# Patient Record
Sex: Male | Born: 1975 | Race: White | Hispanic: No | Marital: Married | Smoking: Never smoker
Health system: Southern US, Community
[De-identification: ages and names within clinical notes are randomized; demographics above are authoritative.]

## PROBLEM LIST (undated history)

## (undated) DIAGNOSIS — E785 Hyperlipidemia, unspecified: Principal | ICD-10-CM

## (undated) DIAGNOSIS — F329 Major depressive disorder, single episode, unspecified: Secondary | ICD-10-CM

## (undated) DIAGNOSIS — R945 Abnormal results of liver function studies: Secondary | ICD-10-CM

## (undated) DIAGNOSIS — F32A Depression, unspecified: Secondary | ICD-10-CM

## (undated) HISTORY — DX: Depression, unspecified: F32.A

## (undated) HISTORY — DX: Abnormal results of liver function studies: R94.5

## (undated) HISTORY — DX: Major depressive disorder, single episode, unspecified: F32.9

## (undated) HISTORY — DX: Hyperlipidemia, unspecified: E78.5

## (undated) HISTORY — PX: WISDOM TOOTH EXTRACTION: SHX21

---

## 1999-08-19 ENCOUNTER — Emergency Department (HOSPITAL_COMMUNITY): Admission: EM | Admit: 1999-08-19 | Discharge: 1999-08-19 | Payer: Self-pay | Admitting: Emergency Medicine

## 1999-08-19 ENCOUNTER — Encounter: Payer: Self-pay | Admitting: Emergency Medicine

## 2005-05-24 ENCOUNTER — Emergency Department (HOSPITAL_COMMUNITY): Admission: EM | Admit: 2005-05-24 | Discharge: 2005-05-25 | Payer: Self-pay | Admitting: Emergency Medicine

## 2011-09-29 ENCOUNTER — Other Ambulatory Visit: Payer: Self-pay | Admitting: Family Medicine

## 2011-09-29 DIAGNOSIS — R7401 Elevation of levels of liver transaminase levels: Secondary | ICD-10-CM

## 2011-10-17 ENCOUNTER — Ambulatory Visit
Admission: RE | Admit: 2011-10-17 | Discharge: 2011-10-17 | Disposition: A | Discharge status: Home / Self Care | Payer: 59 | Source: Ambulatory Visit | Attending: Family Medicine | Admitting: Family Medicine

## 2011-10-17 DIAGNOSIS — R7401 Elevation of levels of liver transaminase levels: Secondary | ICD-10-CM

## 2012-05-09 LAB — MUMPS ANTIBODY, IGG: Rubella Antibodies, IGG: 39

## 2013-01-21 LAB — BASIC METABOLIC PANEL
BUN: 16 mg/dL (ref 4–21)
Creatinine: 1.1 mg/dL (ref 0.6–1.3)
GLUCOSE: 63 mg/dL
POTASSIUM: 4.7 mmol/L (ref 3.4–5.3)
Sodium: 139 mmol/L (ref 137–147)

## 2013-01-21 LAB — HEPATIC FUNCTION PANEL
ALT: 100 U/L — AB (ref 10–40)
AST: 45 U/L — AB (ref 14–40)

## 2013-01-21 LAB — LIPID PANEL
CHOLESTEROL: 185 mg/dL (ref 0–200)
HDL: 39 mg/dL (ref 35–70)
LDL CALC: 127 mg/dL
Triglycerides: 132 mg/dL (ref 40–160)

## 2013-05-27 LAB — LIPID PANEL: Cholesterol: 240 mg/dL — AB (ref 0–200)

## 2013-08-20 ENCOUNTER — Ambulatory Visit (INDEPENDENT_AMBULATORY_CARE_PROVIDER_SITE_OTHER): Payer: 59 | Admitting: Family Medicine

## 2013-08-20 ENCOUNTER — Encounter: Payer: Self-pay | Admitting: Family Medicine

## 2013-08-20 VITALS — BP 118/77 | HR 86 | Ht 71.0 in | Wt 198.0 lb

## 2013-08-20 DIAGNOSIS — R079 Chest pain, unspecified: Secondary | ICD-10-CM

## 2013-08-20 DIAGNOSIS — E785 Hyperlipidemia, unspecified: Secondary | ICD-10-CM

## 2013-08-20 DIAGNOSIS — R945 Abnormal results of liver function studies: Secondary | ICD-10-CM

## 2013-08-20 DIAGNOSIS — R7989 Other specified abnormal findings of blood chemistry: Secondary | ICD-10-CM | POA: Insufficient documentation

## 2013-08-20 HISTORY — DX: Hyperlipidemia, unspecified: E78.5

## 2013-08-20 HISTORY — DX: Other specified abnormal findings of blood chemistry: R79.89

## 2013-08-20 LAB — CBC
HCT: 46.5 % (ref 39.0–52.0)
Hemoglobin: 16.4 g/dL (ref 13.0–17.0)
MCH: 30.1 pg (ref 26.0–34.0)
MCHC: 35.3 g/dL (ref 30.0–36.0)
MCV: 85.5 fL (ref 78.0–100.0)
Platelets: 338 10*3/uL (ref 150–400)
RBC: 5.44 MIL/uL (ref 4.22–5.81)
RDW: 13.6 % (ref 11.5–15.5)
WBC: 5.3 10*3/uL (ref 4.0–10.5)

## 2013-08-20 LAB — COMPLETE METABOLIC PANEL WITH GFR
ALT: 51 U/L (ref 0–53)
AST: 25 U/L (ref 0–37)
Albumin: 4.9 g/dL (ref 3.5–5.2)
Alkaline Phosphatase: 60 U/L (ref 39–117)
BUN: 16 mg/dL (ref 6–23)
CO2: 28 mEq/L (ref 19–32)
Calcium: 10.2 mg/dL (ref 8.4–10.5)
Chloride: 102 mEq/L (ref 96–112)
Creat: 1.05 mg/dL (ref 0.50–1.35)
GFR, Est African American: >89 mL/min
GFR, Est Non African American: >89 mL/min
Glucose, Bld: 85 mg/dL (ref 70–99)
Potassium: 4.4 mEq/L (ref 3.5–5.3)
Sodium: 138 mEq/L (ref 135–145)
Total Bilirubin: 0.4 mg/dL (ref 0.2–1.2)
Total Protein: 7.3 g/dL (ref 6.0–8.3)

## 2013-08-20 LAB — LIPID PANEL
Cholesterol: 244 mg/dL — ABNORMAL HIGH (ref 0–200)
HDL: 42 mg/dL (ref >39)
LDL Cholesterol: 174 mg/dL — ABNORMAL HIGH (ref 0–99)
Total CHOL/HDL Ratio: 5.8 Ratio
Triglycerides: 140 mg/dL (ref <150)
VLDL: 28 mg/dL (ref 0–40)

## 2013-08-20 NOTE — Progress Notes (Signed)
CC: Brandon Thomas is a 38 y.o. male is here for Establish Care and check cholesterol   Subjective: HPI:  Pleasant 38 year old here to establish care  Patient reports a history of elevated LFTs found one a half years ago in the setting of taking Crestor for hyperlipidemia. Workup included hepatitis panel per his report and a liver ultrasound which I have reviewed which which he reports results were unremarkable.  He has not had LFTs checked since stopping Crestor. During the time of taking Crestor he believes he did have waxing and waning right upper quadrant pain. Currently denies any right upper quadrant pain. Denies any recent or remote icterus nor jaundice. He rarely drinks alcohol and rarely uses acetaminophen.  He reports a history of hyperlipidemia most recently checked by his work in January he is uncertain of the specifics of his lipid breakdown. He denies any epigastric pain recently remotely. Denies limb claudication but he does casually mention at the end of the visit that he occasionally gets exertional chest pain.  He describes as exertional chest pain is mild, only has pain, localized in the anterior chest and nonradiating. Denies episodes at rest, the example he gives use upper extremities for tasks such as yard work. He has no known cardiac history. He has requested stress test with his prior providers however they have declined. He reports he has had an EKG in the past for this concern and it has been unremarkable.  Review of Systems - General ROS: negative for - chills, fever, night sweats, weight gain or weight loss Ophthalmic ROS: negative for - decreased vision Psychological ROS: negative for - anxiety or depression ENT ROS: negative for - hearing change, nasal congestion, tinnitus or allergies Hematological and Lymphatic ROS: negative for - bleeding problems, bruising or swollen lymph nodes Breast ROS: negative Respiratory ROS: no cough, shortness of breath, or  wheezing Cardiovascular ROS: no dyspnea on exertion Gastrointestinal ROS: no abdominal pain, change in bowel habits, or black or bloody stools Genito-Urinary ROS: negative for - genital discharge, genital ulcers, incontinence or abnormal bleeding from genitals Musculoskeletal ROS: negative for - joint pain or muscle pain Neurological ROS: negative for - headaches or memory loss Dermatological ROS: negative for lumps, mole changes, rash and skin lesion changes  Past Medical History  Diagnosis Date  . Elevated LFTs 08/20/2013  . Hyperlipidemia 08/20/2013    No past surgical history on file. No family history on file.  History   Social History  . Marital Status: Single    Spouse Name: N/A    Number of Children: N/A  . Years of Education: N/A   Occupational History  . Not on file.   Social History Main Topics  . Smoking status: Never Smoker   . Smokeless tobacco: Current User    Types: Snuff  . Alcohol Use: 0.5 oz/week    1 drink(s) per week  . Drug Use: No  . Sexual Activity: Yes    Partners: Female   Other Topics Concern  . Not on file   Social History Narrative  . No narrative on file     Objective: BP 118/77  Pulse 86  Ht 5' 11"  (1.803 m)  Wt 198 lb (89.812 kg)  BMI 27.63 kg/m2  General: Alert and Oriented, No Acute Distress HEENT: Pupils equal, round, reactive to light. Conjunctivae clear.  Moist membranes pharynx unremarkable Lungs: Clear to auscultation bilaterally, no wheezing/ronchi/rales.  Comfortable work of breathing. Good air movement. Cardiac: Regular rate and rhythm. Normal S1/S2.  No murmurs, rubs, nor gallops.  No carotid bruits  Abdomen: Normal bowel sounds, soft and non tender without palpable masses. Extremities: No peripheral edema.  Strong peripheral pulses.  Mental Status: No depression, anxiety, nor agitation. Skin: Warm and dry.  Assessment & Plan: Brandon Thomas was seen today for establish care and check cholesterol.  Diagnoses and  associated orders for this visit:  Hyperlipidemia - Lipid panel  Elevated LFTs - COMPLETE METABOLIC PANEL WITH GFR  Exertional chest pain - Ambulatory referral to Cardiology - CBC    Hyperlipidemia: Checking lipid panel today Elevated LFTs: Recheck LFTs, clinically there is a high suspicion that this was caused by Crestor given the timeline of his complaints and on chart review unremarkable LFTs years prior to 2013 Exertional chest pain: Checking hemoglobin per his request will refer to cardiology for further discussion of whether a stress test is warranted   Return if symptoms worsen or fail to improve.

## 2013-08-21 ENCOUNTER — Telehealth: Payer: Self-pay | Admitting: Family Medicine

## 2013-08-21 MED ORDER — PITAVASTATIN CALCIUM 2 MG PO TABS: 1.0000 | ORAL_TABLET | Freq: Every day | ORAL | Status: DC

## 2013-08-21 NOTE — Telephone Encounter (Signed)
Seth Bake, Will you please let patient know that liver function is within normal limits and liver enzymes are back to normal.  Blood cell counts are normal and anemia is not the source of any of his chest pain.  As he predicted his cholesterol is quite elevated. LDL cholesterol was 174 with a goal of less than 130.  I'd recommend he start a medicaiton called Livalo which is a statin however known for extremely low incidences of liver inflammation.  I'll set aside one month of samples and an Rx on my counter for him to pick up if interested.  F/U one month after starting to check liver enzymes.

## 2013-08-21 NOTE — Telephone Encounter (Signed)
Pt notified and samples and rx up front

## 2013-09-08 ENCOUNTER — Encounter: Payer: Self-pay | Admitting: Family Medicine

## 2013-09-08 DIAGNOSIS — F419 Anxiety disorder, unspecified: Secondary | ICD-10-CM | POA: Insufficient documentation

## 2013-09-09 ENCOUNTER — Encounter: Payer: Self-pay | Admitting: *Deleted

## 2013-09-24 ENCOUNTER — Institutional Professional Consult (permissible substitution): Payer: 59 | Admitting: Cardiology

## 2013-10-27 ENCOUNTER — Other Ambulatory Visit: Payer: Self-pay | Admitting: *Deleted

## 2013-10-27 MED ORDER — PITAVASTATIN CALCIUM 2 MG PO TABS: 1.0000 | ORAL_TABLET | Freq: Every day | ORAL | Status: DC

## 2013-10-29 ENCOUNTER — Other Ambulatory Visit: Payer: Self-pay | Admitting: *Deleted

## 2013-10-29 MED ORDER — PITAVASTATIN CALCIUM 2 MG PO TABS: 1.0000 | ORAL_TABLET | Freq: Every day | ORAL | Status: DC

## 2013-10-29 NOTE — Telephone Encounter (Signed)
Livalo refilled to [facility]

## 2014-06-30 ENCOUNTER — Telehealth: Payer: Self-pay

## 2014-06-30 NOTE — Telephone Encounter (Signed)
Sent PA for Livalo 29m through cover my meds waiting on auth. - CF

## 2014-07-02 NOTE — Telephone Encounter (Signed)
Received fax  For Livalo and it was denied due to not trying any other statins first - CF

## 2014-07-15 ENCOUNTER — Ambulatory Visit: Payer: Self-pay | Admitting: Family Medicine

## 2014-07-17 ENCOUNTER — Ambulatory Visit (INDEPENDENT_AMBULATORY_CARE_PROVIDER_SITE_OTHER): Payer: BLUE CROSS/BLUE SHIELD | Admitting: Family Medicine

## 2014-07-17 ENCOUNTER — Encounter: Payer: Self-pay | Admitting: Family Medicine

## 2014-07-17 VITALS — BP 134/88 | HR 99 | Wt 201.0 lb

## 2014-07-17 DIAGNOSIS — R7989 Other specified abnormal findings of blood chemistry: Secondary | ICD-10-CM | POA: Diagnosis not present

## 2014-07-17 DIAGNOSIS — E785 Hyperlipidemia, unspecified: Secondary | ICD-10-CM

## 2014-07-17 DIAGNOSIS — G4700 Insomnia, unspecified: Secondary | ICD-10-CM | POA: Diagnosis not present

## 2014-07-17 DIAGNOSIS — R945 Abnormal results of liver function studies: Principal | ICD-10-CM

## 2014-07-17 MED ORDER — ESZOPICLONE 2 MG PO TABS: 2.0000 mg | ORAL_TABLET | Freq: Every evening | ORAL | Status: DC | PRN

## 2014-07-17 MED ORDER — ATORVASTATIN CALCIUM 20 MG PO TABS: 20.0000 mg | ORAL_TABLET | Freq: Every day | ORAL | Status: DC

## 2014-07-17 NOTE — Progress Notes (Signed)
CC: Brandon Thomas is a 39 y.o. male is here for Hyperlipidemia   Subjective: HPI:  Follow-up hyperlipidemia: For the past month he has not been on any lipid-lowering medication. Livalo is no longer covered by his insurance plan. He was taking this medication without any side effects. He's also been on Crestor in the past however caused elevated liver enzymes. No other anti-lipid medications in the past. No chest pain shortness of breath orthopnea peripheral edema nor motor or sensory disturbances. No known cardiovascular disease.  Follow-up elevated LFTs. This is the first for him to return for mildly elevated LFTs last year. Rare alcohol useacetaminophen use. No right upper quadrant pain or scleral icterus.  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 Difficulty but seen asleep and falling asleep. He's tried Ambien in the past however he would wake up within 2-3 hours and could not get back to bed. Nothing particularly seems to be influencing symptomsother than being in a foreign environment. Was that there something that he can take to help with this.   Review Of Systems Outlined In HPI  Past Medical History  Diagnosis Date  . Elevated LFTs 08/20/2013  . Hyperlipidemia 08/20/2013    No past surgical history on file. No family history on file.  History   Social History  . Marital Status: Single    Spouse Name: N/A  . Number of Children: N/A  . Years of Education: N/A   Occupational History  . Not on file.   Social History Main Topics  . Smoking status: Never Smoker   . Smokeless tobacco: Current User    Types: Snuff  . Alcohol Use: 0.5 oz/week    1 drink(s) per week  . Drug Use: No  . Sexual Activity:    Partners: Female   Other Topics Concern  . Not on file   Social History Narrative     Objective: BP 134/88 mmHg  Pulse 99  Wt 201 lb (91.173 kg)  General: Alert and Oriented, No Acute Distress HEENT:  Pupils equal, round, reactive to light. Conjunctivae clear.  Moist mucous membranes Lungs: Clear to auscultation bilaterally, no wheezing/ronchi/rales.  Comfortable work of breathing. Good air movement. Cardiac: Regular rate and rhythm. Normal S1/S2.  No murmurs, rubs, nor gallops.   Extremities: No peripheral edema.  Strong peripheral pulses.  Mental Status: No depression, anxiety, nor agitation. Skin: Warm and dry.  Assessment & Plan: Gabryel was seen today for hyperlipidemia.  Diagnoses and all orders for this visit:  Elevated LFTs Orders: -     Lipid panel -     COMPLETE METABOLIC PANEL WITH GFR  Hyperlipidemia Orders: -     atorvastatin (LIPITOR) 20 MG tablet; Take 1 tablet (20 mg total) by mouth daily. -     Lipid panel -     COMPLETE METABOLIC PANEL WITH GFR  Insomnia Orders: -     eszopiclone (LUNESTA) 2 MG TABS tablet; Take 1 tablet (2 mg total) by mouth at bedtime as needed for sleep. Take immediately before bedtime   Elevated LFTs: clinically controlled,Checking LFTs after he starts Lipitor Hyperlipidemia: Likely uncontrolled off of statin therapy, begin Lipitor and submitted lipid panel in May Insomnia: Uncontrolled start Lunesta   Return if symptoms worsen or fail to improve.

## 2014-07-17 NOTE — Patient Instructions (Signed)
Had been on celexa in the past but caused tremors.

## 2015-03-25 ENCOUNTER — Ambulatory Visit (INDEPENDENT_AMBULATORY_CARE_PROVIDER_SITE_OTHER): Payer: BLUE CROSS/BLUE SHIELD | Admitting: Family Medicine

## 2015-03-25 ENCOUNTER — Encounter: Payer: Self-pay | Admitting: Family Medicine

## 2015-03-25 VITALS — BP 113/76 | HR 97 | Wt 203.0 lb

## 2015-03-25 DIAGNOSIS — R079 Chest pain, unspecified: Secondary | ICD-10-CM | POA: Diagnosis not present

## 2015-03-25 DIAGNOSIS — Z23 Encounter for immunization: Secondary | ICD-10-CM | POA: Diagnosis not present

## 2015-03-25 DIAGNOSIS — Z0000 Encounter for general adult medical examination without abnormal findings: Secondary | ICD-10-CM | POA: Diagnosis not present

## 2015-03-25 DIAGNOSIS — L989 Disorder of the skin and subcutaneous tissue, unspecified: Secondary | ICD-10-CM | POA: Diagnosis not present

## 2015-03-25 LAB — COMPLETE METABOLIC PANEL WITH GFR
ALT: 47 U/L — AB (ref 9–46)
AST: 27 U/L (ref 10–40)
Albumin: 4.6 g/dL (ref 3.6–5.1)
Alkaline Phosphatase: 58 U/L (ref 40–115)
BUN: 16 mg/dL (ref 7–25)
CO2: 29 mmol/L (ref 20–31)
CREATININE: 1.11 mg/dL (ref 0.60–1.35)
Calcium: 10.2 mg/dL (ref 8.6–10.3)
Chloride: 99 mmol/L (ref 98–110)
GFR, Est Non African American: 83 mL/min (ref >=60)
Glucose, Bld: 95 mg/dL (ref 65–99)
Potassium: 4.7 mmol/L (ref 3.5–5.3)
Sodium: 140 mmol/L (ref 135–146)
Total Bilirubin: 0.8 mg/dL (ref 0.2–1.2)
Total Protein: 7.3 g/dL (ref 6.1–8.1)

## 2015-03-25 LAB — CBC
HCT: 48.5 % (ref 39.0–52.0)
Hemoglobin: 16.4 g/dL (ref 13.0–17.0)
MCH: 29.6 pg (ref 26.0–34.0)
MCHC: 33.8 g/dL (ref 30.0–36.0)
MCV: 87.5 fL (ref 78.0–100.0)
MPV: 9.4 fL (ref 8.6–12.4)
PLATELETS: 390 10*3/uL (ref 150–400)
RBC: 5.54 MIL/uL (ref 4.22–5.81)
RDW: 14.1 % (ref 11.5–15.5)
WBC: 6.6 10*3/uL (ref 4.0–10.5)

## 2015-03-25 LAB — LIPID PANEL
Cholesterol: 254 mg/dL — ABNORMAL HIGH (ref 125–200)
HDL: 34 mg/dL — AB (ref >=40)
LDL CALC: 186 mg/dL — AB (ref <130)
TRIGLYCERIDES: 171 mg/dL — AB (ref <150)
Total CHOL/HDL Ratio: 7.5 Ratio — ABNORMAL HIGH (ref <=5.0)
VLDL: 34 mg/dL — ABNORMAL HIGH (ref <30)

## 2015-03-25 NOTE — Patient Instructions (Signed)
Dr. Lajoyce Lauber General Advice Following Your Complete Physical Exam  The Benefits of Regular Exercise: Unless you suffer from an uncontrolled cardiovascular condition, studies strongly suggest that regular exercise and physical activity will add to both the quality and length of your life.  The World Health Organization recommends 150 minutes of moderate intensity aerobic activity every week.  This is best split over 3-4 days a week, and can be as simple as a brisk walk for just over 35 minutes "most days of the week".  This type of exercise has been shown to lower LDL-Cholesterol, lower average blood sugars, lower blood pressure, lower cardiovascular disease risk, improve memory, and increase one's overall sense of wellbeing.  The addition of anaerobic (or "strength training") exercises offers additional benefits including but not limited to increased metabolism, prevention of osteoporosis, and improved overall cholesterol levels.  How Can I Strive For A Low-Fat Diet?: Current guidelines recommend that 25-35 percent of your daily energy (food) intake should come from fats.  One might ask how can this be achieved without having to dissect each meal on a daily basis?  Switch to skim or 1% milk instead of whole milk.  Focus on lean meats such as ground [place], fresh fish, baked chicken, and lean cuts of beef as your source of dietary protein.  Limit saturated fat consumption to less than 10% of your daily caloric intake.  Limit trans fatty acid consumption primarily by limiting synthetic trans fats such as partially hydrogenated oils (Ex: fried fast foods).  Substitute olive or vegetable oil for solid fats where possible.  Moderation of Salt Intake: Provided you don't carry a diagnosis of congestive heart failure nor renal failure, I recommend a daily allowance of no more than 2300 mg of salt (sodium).  Keeping under this daily goal is associated with a decreased risk of cardiovascular events, creeping  above it can lead to elevated blood pressures and increases your risk of cardiovascular events.  Milligrams (mg) of salt is listed on all nutrition labels, and your daily intake can add up faster than you think.  Most canned and frozen dinners can pack in over half your daily salt allowance in one meal.    Lifestyle Health Risks: Certain lifestyle choices carry specific health risks.  As you may already know, tobacco use has been associated with increasing one's risk of cardiovascular disease, pulmonary disease, numerous cancers, among many other issues.  What you may not know is that there are medications and nicotine replacement strategies that can more than double your chances of successfully quitting.  I would be thrilled to help manage your quitting strategy if you currently use tobacco products.  When it comes to alcohol use, I've yet to find an "ideal" daily allowance.  Provided an individual does not have a medical condition that is exacerbated by alcohol consumption, general guidelines determine "safe drinking" as no more than two standard drinks for a man or no more than one standard drink for a male per day.  However, much debate still exists on whether any amount of alcohol consumption is technically "safe".  My general advice, keep alcohol consumption to a minimum for general health promotion.  If you or others believe that alcohol, tobacco, or recreational drug use is interfering with your life, I would be happy to provide confidential counseling regarding treatment options.  General "Over The Counter" Nutrition Advice: Postmenopausal women should aim for a daily calcium intake of 1200 mg, however a significant portion of this might already be  provided by diets including milk, yogurt, cheese, and other dairy products.  Vitamin D has been shown to help preserve bone density, prevent fatigue, and has even been shown to help reduce falls in the elderly.  Ensuring a daily intake of 800 Units of  Vitamin D is a good place to start to enjoy the above benefits, we can easily check your Vitamin D level to see if you'd potentially benefit from supplementation beyond 800 Units a day.  Folic Acid intake should be of particular concern to women of childbearing age.  Daily consumption of 606-770 mcg of Folic Acid is recommended to minimize the chance of spinal cord defects in a fetus should pregnancy occur.    For many adults, accidents still remain one of the most common culprits when it comes to cause of death.  Some of the simplest but most effective preventitive habits you can adopt include regular seatbelt use, proper helmet use, securing firearms, and regularly testing your smoke and carbon monoxide detectors.  Grettel Rames B. [facility] [address], [place], [place] [address] Phone: [phone]

## 2015-03-25 NOTE — Progress Notes (Signed)
CC: Brandon Thomas is a 39 y.o. male is here for Annual Exam and Abdominal Pain   Subjective: HPI:  Colonoscopy: No current indication Prostate: Discussed screening risks/beneifts with patient today, no current indication for PSA Influenza Vaccine: Were received today Pneumovax: No current indication Td/Tdap: Will receive today Zoster: (Start 40 yo)  Requesting complete physical exam. He stopped taking atorvastatin after a few weeks he began to develop right upper quadrant discomfort. It went away a few days after stopping the medication.   Review of Systems - General ROS: negative for - chills, fever, night sweats, weight gain or weight loss Ophthalmic ROS: negative for - decreased vision Psychological ROS: negative for - anxiety or depression ENT ROS: negative for - hearing change, nasal congestion, tinnitus or allergies Hematological and Lymphatic ROS: negative for - bleeding problems, bruising or swollen lymph nodes Breast ROS: negative Respiratory ROS: no cough, shortness of breath, or wheezing Cardiovascular ROS: no  dyspnea on exertion, positive for chest pain a little more than a month ago Gastrointestinal ROS: no abdominal pain, change in bowel habits, or black or bloody stools Genito-Urinary ROS: negative for - genital discharge, genital ulcers, incontinence or abnormal bleeding from genitals Musculoskeletal ROS: negative for - joint pain or muscle pain Neurological ROS: negative for - headaches or memory loss Dermatological ROS: negative for lumps, mole changes, rash and skin lesion changes  Past Medical History  Diagnosis Date  . Elevated LFTs 08/20/2013  . Hyperlipidemia 08/20/2013    No past surgical history on file. No family history on file.  Social History   Social History  . Marital Status: Single    Spouse Name: N/A  . Number of Children: N/A  . Years of Education: N/A   Occupational History  . Not on file.   Social History Main Topics  .  Smoking status: Never Smoker   . Smokeless tobacco: Current User    Types: Snuff  . Alcohol Use: 0.5 oz/week    1 drink(s) per week  . Drug Use: No  . Sexual Activity:    Partners: Female   Other Topics Concern  . Not on file   Social History Narrative     Objective: BP 113/76 mmHg  Pulse 97  Wt 203 lb (92.08 kg)  General: No Acute Distress HEENT: Atraumatic, normocephalic, conjunctivae normal without scleral icterus.  No nasal discharge, hearing grossly intact, TMs with good landmarks bilaterally with no middle ear abnormalities, posterior pharynx clear without oral lesions. Neck: Supple, trachea midline, no cervical nor supraclavicular adenopathy. Pulmonary: Clear to auscultation bilaterally without wheezing, rhonchi, nor rales. Cardiac: Regular rate and rhythm.  No murmurs, rubs, nor gallops. No peripheral edema.  2+ peripheral pulses bilaterally. Abdomen: Bowel sounds normal.  No masses.  Non-tender without rebound.  Negative Murphy's sign. Neuro: Gait unremarkable, CN II-XII grossly intact.  C5-C6 Reflex 2/4 Bilaterally, L4 Reflex 2/4 Bilaterally.  Cerebellar function intact. Skin: No rashes. Skin tag on left eyelid Psych: Alert and oriented to person/place/time.  Thought process normal. No anxiety/depression.  Assessment & Plan: Brandon Thomas was seen today for annual exam and abdominal pain.  Diagnoses and all orders for this visit:  Annual physical exam -     CBC -     COMPLETE METABOLIC PANEL WITH GFR -     Lipid panel  Chest pain, unspecified chest pain type -     Ambulatory referral to Cardiology  Skin lesion -     Ambulatory referral to Dermatology   Healthy lifestyle  interventions including but not limited to regular exercise, a healthy low fat diet, moderation of salt intake, the dangers of tobacco/alcohol/recreational drug use, nutrition supplementation, and accident avoidance were discussed with the patient and a handout was provided for future  reference.  Dermot referral for consideration of removal of skin tag on left eyelid. Cardiology referral per his request which seems reasonable.  Return in about 3 months (around 06/25/2015) for Cholesterol Follow Up.

## 2015-03-25 NOTE — Addendum Note (Signed)
Addended by: Delrae Alfred on: 03/25/2015 11:30 AM   Modules accepted: Orders

## 2015-03-26 ENCOUNTER — Telehealth: Payer: Self-pay | Admitting: Family Medicine

## 2015-03-26 MED ORDER — PRAVASTATIN SODIUM 20 MG PO TABS: 20.0000 mg | ORAL_TABLET | Freq: Every day | ORAL | Status: DC

## 2015-03-26 NOTE — Telephone Encounter (Signed)
Pt advised.

## 2015-03-26 NOTE — Telephone Encounter (Signed)
Will you please let patient know that his kidney function,  Blood sugar, and blood cell counts were normal.  His cholesterol was elevated where I'd recommend he try a cholesterol lowering  Medication called pravastatin that is less likely to cause RUQ pain.  Also his triglycerides were mildly elevated which appear to be causing one of his two liver enzymes to be barely elevated.  This should improve with taking pravastatin. F/u 3 months after starting this medication.

## 2015-04-09 ENCOUNTER — Ambulatory Visit: Payer: BLUE CROSS/BLUE SHIELD | Admitting: Family Medicine

## 2015-04-12 ENCOUNTER — Ambulatory Visit (INDEPENDENT_AMBULATORY_CARE_PROVIDER_SITE_OTHER): Payer: BLUE CROSS/BLUE SHIELD | Admitting: Family Medicine

## 2015-04-12 ENCOUNTER — Encounter: Payer: Self-pay | Admitting: Family Medicine

## 2015-04-12 VITALS — BP 129/80 | HR 105 | Wt 200.0 lb

## 2015-04-12 DIAGNOSIS — R1011 Right upper quadrant pain: Secondary | ICD-10-CM

## 2015-04-12 DIAGNOSIS — E291 Testicular hypofunction: Secondary | ICD-10-CM

## 2015-04-12 LAB — COMPLETE METABOLIC PANEL WITH GFR
ALT: 35 U/L (ref 9–46)
AST: 23 U/L (ref 10–40)
Albumin: 4.5 g/dL (ref 3.6–5.1)
Alkaline Phosphatase: 58 U/L (ref 40–115)
BUN: 13 mg/dL (ref 7–25)
CHLORIDE: 101 mmol/L (ref 98–110)
CO2: 31 mmol/L (ref 20–31)
Calcium: 10.1 mg/dL (ref 8.6–10.3)
Creat: 1.06 mg/dL (ref 0.60–1.35)
GFR, EST NON AFRICAN AMERICAN: 87 mL/min (ref >=60)
Glucose, Bld: 64 mg/dL — ABNORMAL LOW (ref 65–99)
Potassium: 4.4 mmol/L (ref 3.5–5.3)
Sodium: 140 mmol/L (ref 135–146)
Total Bilirubin: 0.5 mg/dL (ref 0.2–1.2)
Total Protein: 6.7 g/dL (ref 6.1–8.1)

## 2015-04-12 NOTE — Progress Notes (Signed)
CC: Brandon Thomas is a 39 y.o. male is here for Prostate Check and Labs Only   Subjective: HPI:  Recently had testosterone checked by his psychiatrist for symptoms of fatigue. He tells me his level was 150.she offered him testosterone supplementation provided he come here to have an evaluation of his prostate. He denies urinary hesitancy or urgency. He never wakes up to urinate in the middle the night. No known family history of prostate cancer or enlarged prostate. He like to start taking testosterone however would like to know if it is safe.   Since I saw him last he is not sure whether or not he's been having right upper quadrant pain. He believes it's possibly in his head because symptoms are less than mild and only present if he actively thinks about whether or not he is having pain. There's been no fevers, chills, nausea, decreased appetite or any other gastrointestinal complaints. No unintentional weight loss or gain.  Review Of Systems Outlined In HPI  Past Medical History  Diagnosis Date  . Elevated LFTs 08/20/2013  . Hyperlipidemia 08/20/2013    No past surgical history on file. No family history on file.  Social History   Social History  . Marital Status: Single    Spouse Name: N/A  . Number of Children: N/A  . Years of Education: N/A   Occupational History  . Not on file.   Social History Main Topics  . Smoking status: Never Smoker   . Smokeless tobacco: Current User    Types: Snuff  . Alcohol Use: 0.5 oz/week    1 drink(s) per week  . Drug Use: No  . Sexual Activity:    Partners: Female   Other Topics Concern  . Not on file   Social History Narrative     Objective: BP 129/80 mmHg  Pulse 105  Wt 200 lb (90.719 kg)  Vital signs reviewed. General: Alert and Oriented, No Acute Distress HEENT: Pupils equal, round, reactive to light. Conjunctivae clear.  External ears unremarkable.  Moist mucous membranes. Lungs: Clear and comfortable work of  breathing, speaking in full sentences without accessory muscle use. Cardiac: Regular rate and rhythm.  Abdomen: Soft nontender Extremities: No peripheral edema.  Strong peripheral pulses.  Mental Status: No depression, anxiety, nor agitation. Logical though process. Skin: Warm and dry.  Assessment & Plan: Mikhail was seen today for prostate check and labs only.  Diagnoses and all orders for this visit:  Hypogonadism male -     COMPLETE METABOLIC PANEL WITH GFR -     PSA -     Testosterone  Abdominal pain, right upper quadrant   Hypogonadism: Rechecking testosterone and if PSA is normal he is a candidate to start testosterone supplementation. We discussed options including testosterone injections versus topical preparations.   right upper quadrant pain: Rule out hepatitis with metabolic panel.  Return if symptoms worsen or fail to improve.

## 2015-04-13 ENCOUNTER — Encounter: Payer: Self-pay | Admitting: Family Medicine

## 2015-04-13 ENCOUNTER — Encounter: Payer: Self-pay | Admitting: Cardiology

## 2015-04-13 ENCOUNTER — Ambulatory Visit (INDEPENDENT_AMBULATORY_CARE_PROVIDER_SITE_OTHER): Payer: BLUE CROSS/BLUE SHIELD | Admitting: Cardiology

## 2015-04-13 VITALS — BP 110/86 | HR 77 | Ht 71.0 in | Wt 197.2 lb

## 2015-04-13 DIAGNOSIS — R5382 Chronic fatigue, unspecified: Secondary | ICD-10-CM

## 2015-04-13 DIAGNOSIS — E785 Hyperlipidemia, unspecified: Secondary | ICD-10-CM

## 2015-04-13 DIAGNOSIS — R079 Chest pain, unspecified: Secondary | ICD-10-CM

## 2015-04-13 DIAGNOSIS — R0683 Snoring: Secondary | ICD-10-CM | POA: Diagnosis not present

## 2015-04-13 DIAGNOSIS — R5383 Other fatigue: Secondary | ICD-10-CM | POA: Diagnosis not present

## 2015-04-13 DIAGNOSIS — E291 Testicular hypofunction: Secondary | ICD-10-CM | POA: Insufficient documentation

## 2015-04-13 LAB — TESTOSTERONE: TESTOSTERONE: 228 ng/dL — AB (ref 300–890)

## 2015-04-13 LAB — PSA: PSA: 0.45 ng/mL (ref <=4.00)

## 2015-04-13 NOTE — Patient Instructions (Signed)
    Your physician has requested that you have an exercise tolerance test. For further information please visit [email or web address]. Please also follow instruction sheet, as given.  Your physician has recommended that you have a sleep study. This test records several body functions during sleep, including: brain activity, eye movement, oxygen and carbon dioxide blood levels, heart rate and rhythm, breathing rate and rhythm, the flow of air through your mouth and nose, snoring, body muscle movements, and chest and belly movement.  Non-Cardiac CT Angiography (CTA), is a special type of CT scan that uses a computer to produce multi-dimensional views of major blood vessels throughout the body. In CT angiography, a contrast material is injected through an IV to help visualize the blood vessels  Your physician recommends that you schedule a follow-up appointment in: as needed - we will call with the results of the above testing.

## 2015-04-13 NOTE — Progress Notes (Signed)
Cardiology Office Note   Date:  04/13/2015   ID:  Brandon Thomas, DOB [birth year], MRN [id number]  PCP:  Marcial Pacas, DO  Cardiologist:   Minus Breeding, MD   Chief Complaint  Patient presents with  . Chest Pain  . Fatigue      History of Present Illness: Brandon Thomas is a 39 y.o. male who presents for evaluation of chest discomfort, fatigue been perspiring. The patient reports that he's had some sharp chest discomfort. This happens sporadically at rest. It is a cramping discomfort at times and at other times dull.  It does not seem to be associated with shortness of breath. He does have excessive diaphoresis with exercise but not with this chest discomfort. The chest discomfort seems to be daily. He's not had this before. It doesn't radiate to his jaw or to his arms. There is no associated nausea. It comes and goes spontaneously. He has decreased energy which has not responded to testosterone treatment. He has daytime fatigue. He does have significant snoring as well. He is not describing shortness of breath, PND or orthopnea. He's had no weight gain or edema. He doesn't exercise routinely   Past Medical History  Diagnosis Date  . Elevated LFTs 08/20/2013  . Hyperlipidemia 08/20/2013  . Depression     Well treated without meds currently    No past surgical history on file.   Current Outpatient Prescriptions  Medication Sig Dispense Refill  . ANDROGEL PUMP 20.25 MG/ACT (1.62%) GEL daily. Apply 3 pumps on the upper arm every morning  1  . Multiple Vitamin (MULTIVITAMIN) tablet Take 1 tablet by mouth daily.    . Omega-3 Fatty Acids (FISH OIL PO) Take by mouth 2 (two) times daily.    . pravastatin (PRAVACHOL) 20 MG tablet Take 1 tablet (20 mg total) by mouth daily. 90 tablet 3   No current facility-administered medications for this visit.    Allergies:   Atorvastatin and Crestor    Social History:  The patient  reports that he has never smoked. His smokeless  tobacco use includes Chew. He reports that he drinks about 0.6 oz of alcohol per week. He reports that he does not use illicit drugs.   Family History:  The patient's family history is not on file. He was adopted.    ROS:  Please see the history of present illness.   Otherwise, review of systems are positive for none.   All other systems are reviewed and negative.    PHYSICAL EXAM: VS:  BP 110/86 mmHg  Pulse 77  Ht 5' 11"  (1.803 m)  Wt 197 lb 4 oz (89.472 kg)  BMI 27.52 kg/m2 , BMI Body mass index is 27.52 kg/(m^2). GENERAL:  Well appearing HEENT:  Pupils equal round and reactive, fundi not visualized, oral mucosa unremarkable NECK:  No jugular venous distention, waveform within normal limits, carotid upstroke brisk and symmetric, no bruits, no thyromegaly LYMPHATICS:  No cervical, inguinal adenopathy LUNGS:  Clear to auscultation bilaterally BACK:  No CVA tenderness CHEST:  Unremarkable HEART:  PMI not displaced or sustained,S1 and S2 within normal limits, no S3, no S4, no clicks, no rubs, no murmurs ABD:  Flat, positive bowel sounds normal in frequency in pitch, no bruits, no rebound, no guarding, no midline pulsatile mass, no hepatomegaly, no splenomegaly EXT:  2 plus pulses throughout, no edema, no cyanosis no clubbing SKIN:  No rashes no nodules NEURO:  Cranial nerves II through XII grossly intact, motor grossly  intact throughout [facility]:  Cognitively intact, oriented to person place and time    EKG:  EKG is ordered today. The ekg ordered today demonstrates sinus rhythm, rate 77, axis within normal limits, intervals within normal limits, no acute ST-T wave changes.   Recent Labs: 03/25/2015: Hemoglobin 16.4; Platelets 390 04/12/2015: ALT 35; BUN 13; Creat 1.06; Potassium 4.4; Sodium 140    Lipid Panel    Component Value Date/Time   CHOL 254* 03/25/2015 0943   TRIG 171* 03/25/2015 0943   HDL 34* 03/25/2015 0943   CHOLHDL 7.5* 03/25/2015 0943   VLDL 34* 03/25/2015 0943     LDLCALC 186* 03/25/2015 0943      Wt Readings from Last 3 Encounters:  04/13/15 197 lb 4 oz (89.472 kg)  04/12/15 200 lb (90.719 kg)  03/25/15 203 lb (92.08 kg)      Other studies Reviewed: Additional studies/ records that were reviewed today include: Labs. Review of the above records demonstrates:  Please see elsewhere in the note.     ASSESSMENT AND PLAN:  CHEST PAIN:  The patient does have significant dyslipidemia and an unknown family history along with chest discomfort.  I will bring the patient back for a POET (Plain Old Exercise Test). This will allow me to screen for obstructive coronary disease, risk stratify and very importantly provide a prescription for exercise.  I will also order a coronary calcium score.  SNORING/FATIGUE:  I suspect sleep apnea. I will plan a sleep study.  DYSLIPIDEMIA:  We would judge the need for treatment based on the above. He is going to see a dietitian.  Current medicines are reviewed at length with the patient today.  The patient does not have concerns regarding medicines.  The following changes have been made:  no change  Labs/ tests ordered today include:   Orders Placed This Encounter  Procedures  . CT Cardiac Scoring  . Exercise Tolerance Test  . EKG 12-Lead  . Split night study     Disposition:   FU with me as needed.      Signed, Minus Breeding, MD  04/13/2015 5:29 PM    [facility]

## 2015-04-14 ENCOUNTER — Telehealth (HOSPITAL_COMMUNITY): Payer: Self-pay | Admitting: *Deleted

## 2015-04-15 ENCOUNTER — Encounter: Payer: Self-pay | Admitting: Internal Medicine

## 2015-04-15 ENCOUNTER — Telehealth: Payer: Self-pay | Admitting: Family Medicine

## 2015-04-15 DIAGNOSIS — K602 Anal fissure, unspecified: Secondary | ICD-10-CM | POA: Insufficient documentation

## 2015-04-15 NOTE — Telephone Encounter (Signed)
Reubin Milan, Will you please let patient know that I put in a referral to meet with a gastroenterologist for further management of his fissure. Please let me know if not contacted by the end of next week.  Also I added 224m of IM testosterone to his medication list.  I'd recommend he receive instructions on how to administer this during a nurse visit and I'll then print off a Rx for him to use this at home.

## 2015-04-15 NOTE — Telephone Encounter (Signed)
Pt advised of new Rx, states once he gets the medication he will schedule a nurse visit to go over self administration. Pt also informed of referral and that he should be expecting a phone call from them. Verbalized understanding. No further questions.

## 2015-04-20 ENCOUNTER — Telehealth: Payer: Self-pay | Admitting: Cardiology

## 2015-04-20 NOTE — Telephone Encounter (Signed)
PER SHARON @ [facility] PATIENT DOES NOT MEET CRITERIA FOR AN ATTENDED SLEEP STUDY, HOWEVER HE DOES MEET CRITERIA FOR A HOME SLEEP TEST.

## 2015-04-21 NOTE — Telephone Encounter (Signed)
OK to schedule for home sleep study.

## 2015-04-21 NOTE — Telephone Encounter (Signed)
Message transferred to [facility] to schedule for an in home study after talking w/Wanda.

## 2015-04-27 ENCOUNTER — Encounter (HOSPITAL_BASED_OUTPATIENT_CLINIC_OR_DEPARTMENT_OTHER): Payer: BLUE CROSS/BLUE SHIELD

## 2015-04-27 ENCOUNTER — Telehealth: Payer: Self-pay | Admitting: Cardiology

## 2015-04-27 MED ORDER — TESTOSTERONE CYPIONATE 200 MG/ML IM SOLN: 200.0000 mg | INTRAMUSCULAR | Status: DC

## 2015-04-27 NOTE — Telephone Encounter (Signed)
Pt needs pre-authorization for his injectable medication.  He is picking up a 30 day supply, paying out of pocket, and waiting for the pre-authorization.

## 2015-04-27 NOTE — Telephone Encounter (Signed)
Received a call from [place] with [facility].She stated patient scheduled for sleep study tonight and his insurance will not cover in patient sleep study.Advised ok to cancel.Dr.Hochrein did ok order to have home sleep study.Nya to schedule after she talks to [place].

## 2015-04-27 NOTE — Telephone Encounter (Signed)
Pt tried to pick up medication from pharmacy.  The pharmacy has not received the prescription.  Do you want to send in the prescription, and then patient will come in for instruction on how to give?

## 2015-04-27 NOTE — Telephone Encounter (Signed)
Rx faxed

## 2015-04-27 NOTE — Telephone Encounter (Signed)
Forwarding to cindy

## 2015-04-27 NOTE — Telephone Encounter (Signed)
Brandon Thomas, Rx placed in in-box ready for pickup/faxing. Please ask him to schedule a nurse visit to learn how to administer

## 2015-04-29 ENCOUNTER — Telehealth: Payer: Self-pay | Admitting: Family Medicine

## 2015-04-29 NOTE — Telephone Encounter (Signed)
Received fax for prior authorization on testosterone sent through cover my meds and received authorization from 03/30/2015 - 04/28/2016. Case ID [id number]. Pharmacy has been notified. - CF

## 2015-04-29 NOTE — Telephone Encounter (Signed)
Received fax for prior authorization I am sending through cover my meds. - CF

## 2015-05-05 ENCOUNTER — Ambulatory Visit (INDEPENDENT_AMBULATORY_CARE_PROVIDER_SITE_OTHER)
Admission: RE | Admit: 2015-05-05 | Discharge: 2015-05-05 | Disposition: A | Discharge status: Home / Self Care | Payer: BLUE CROSS/BLUE SHIELD | Source: Ambulatory Visit | Attending: Cardiology | Admitting: Cardiology

## 2015-05-05 DIAGNOSIS — R079 Chest pain, unspecified: Secondary | ICD-10-CM

## 2015-05-12 ENCOUNTER — Telehealth (HOSPITAL_COMMUNITY): Payer: Self-pay

## 2015-05-13 ENCOUNTER — Telehealth (HOSPITAL_COMMUNITY): Payer: Self-pay

## 2015-05-13 NOTE — Telephone Encounter (Signed)
Encounter complete. 

## 2015-05-14 ENCOUNTER — Ambulatory Visit (HOSPITAL_COMMUNITY)
Admission: RE | Admit: 2015-05-14 | Discharge: 2015-05-14 | Disposition: A | Discharge status: Home / Self Care | Payer: BLUE CROSS/BLUE SHIELD | Source: Ambulatory Visit | Attending: Cardiology | Admitting: Cardiology

## 2015-05-14 DIAGNOSIS — R079 Chest pain, unspecified: Secondary | ICD-10-CM | POA: Insufficient documentation

## 2015-05-14 LAB — EXERCISE TOLERANCE TEST
CHL CUP MPHR: 180 {beats}/min
CHL CUP STRESS STAGE 1 GRADE: 0 %
CHL CUP STRESS STAGE 1 HR: 103 {beats}/min
CHL CUP STRESS STAGE 1 SPEED: 0 mph
CHL CUP STRESS STAGE 10 GRADE: 0 %
CHL CUP STRESS STAGE 10 SPEED: 0 mph
CHL CUP STRESS STAGE 2 GRADE: 0 %
CHL CUP STRESS STAGE 2 SPEED: 0 mph
CHL CUP STRESS STAGE 3 SPEED: 0.3 mph
CHL CUP STRESS STAGE 4 DBP: 68 mmHg
CHL CUP STRESS STAGE 4 SPEED: 1.7 mph
CHL CUP STRESS STAGE 5 HR: 116 {beats}/min
CHL CUP STRESS STAGE 5 SPEED: 2.5 mph
CHL CUP STRESS STAGE 6 GRADE: 14 %
CHL CUP STRESS STAGE 6 HR: 133 {beats}/min
CHL CUP STRESS STAGE 6 SPEED: 3.4 mph
CHL CUP STRESS STAGE 7 HR: 164 {beats}/min
CHL CUP STRESS STAGE 7 SBP: 185 mmHg
CHL CUP STRESS STAGE 7 SPEED: 4.2 mph
CHL CUP STRESS STAGE 8 GRADE: 18 %
CHL CUP STRESS STAGE 8 HR: 176 {beats}/min
CSEPEW: 15.3 METS
CSEPPMHR: 97 %
Exercise duration (min): 13 min
Peak HR: 176 {beats}/min
Percent HR: 99 %
RPE: 20
Rest HR: 104 {beats}/min
Stage 1 DBP: 85 mmHg
Stage 1 SBP: 108 mmHg
Stage 10 DBP: 67 mmHg
Stage 10 HR: 112 {beats}/min
Stage 10 SBP: 124 mmHg
Stage 2 HR: 104 {beats}/min
Stage 3 Grade: 0 %
Stage 3 HR: 105 {beats}/min
Stage 4 Grade: 10 %
Stage 4 HR: 109 {beats}/min
Stage 4 SBP: 136 mmHg
Stage 5 DBP: 64 mmHg
Stage 5 Grade: 12 %
Stage 5 SBP: 122 mmHg
Stage 6 DBP: 65 mmHg
Stage 6 SBP: 160 mmHg
Stage 7 DBP: 65 mmHg
Stage 7 Grade: 16 %
Stage 8 Speed: 5 mph
Stage 9 DBP: 58 mmHg
Stage 9 Grade: 0 %
Stage 9 HR: 155 {beats}/min
Stage 9 SBP: 116 mmHg
Stage 9 Speed: 0 mph

## 2015-06-18 ENCOUNTER — Ambulatory Visit: Payer: BLUE CROSS/BLUE SHIELD | Admitting: Internal Medicine

## 2015-06-18 ENCOUNTER — Other Ambulatory Visit: Payer: Self-pay

## 2015-06-18 MED ORDER — PRAVASTATIN SODIUM 20 MG PO TABS: 20.0000 mg | ORAL_TABLET | Freq: Every day | ORAL | Status: DC

## 2015-06-23 ENCOUNTER — Other Ambulatory Visit: Payer: Self-pay

## 2015-06-23 MED ORDER — PRAVASTATIN SODIUM 20 MG PO TABS: 20.0000 mg | ORAL_TABLET | Freq: Every day | ORAL | Status: DC

## 2015-06-29 ENCOUNTER — Other Ambulatory Visit: Payer: Self-pay

## 2015-06-29 MED ORDER — SYRINGE (DISPOSABLE) 3 ML MISC: Status: DC

## 2015-06-29 MED ORDER — "NEEDLE (DISP) 22G X 1-1/2"" MISC": Status: DC

## 2015-06-29 MED ORDER — PRAVASTATIN SODIUM 20 MG PO TABS: 20.0000 mg | ORAL_TABLET | Freq: Every day | ORAL | Status: DC

## 2015-08-10 ENCOUNTER — Ambulatory Visit (INDEPENDENT_AMBULATORY_CARE_PROVIDER_SITE_OTHER): Payer: BLUE CROSS/BLUE SHIELD | Admitting: Internal Medicine

## 2015-08-10 ENCOUNTER — Encounter: Payer: Self-pay | Admitting: Internal Medicine

## 2015-08-10 VITALS — BP 110/72 | HR 76 | Ht 71.0 in | Wt 197.4 lb

## 2015-08-10 DIAGNOSIS — K602 Anal fissure, unspecified: Secondary | ICD-10-CM | POA: Diagnosis not present

## 2015-08-10 DIAGNOSIS — K6289 Other specified diseases of anus and rectum: Secondary | ICD-10-CM | POA: Diagnosis not present

## 2015-08-10 DIAGNOSIS — K625 Hemorrhage of anus and rectum: Secondary | ICD-10-CM | POA: Diagnosis not present

## 2015-08-10 MED ORDER — NA SULFATE-K SULFATE-MG SULF 17.5-3.13-1.6 GM/177ML PO SOLN: 1.0000 | Freq: Once | ORAL | Status: DC

## 2015-08-10 MED ORDER — DILTIAZEM GEL 2 %: 1.0000 "application " | Freq: Every day | CUTANEOUS | Status: DC

## 2015-08-10 NOTE — Progress Notes (Signed)
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 Marcial Pacas regarding chronic intermittent problems with rectal pain and rectal bleeding felt secondary to fissure. Patient reports a 5-6 year history of intermittent problems with probable rectal fissure. Symptoms will occur when passing firmer than normal stool. While traveling on one occasion he felt an uncomfortable fullness for which she was evaluated in the emergency room. He did have bleeding at that time. No particular lesion identified. In recent weeks he has had symptoms. No pain, bleeding, or abdominal issues. He is adopted. He does have difficulty cleaning the perirectal area after defecation. He still notices a knot-like sensation in that area. GI review of systems otherwise negative.  REVIEW OF SYSTEMS:  All non-GI ROS negative except for sinus allergy, anxiety, fatigue, night sweats  Past Medical History  Diagnosis Date  . Elevated LFTs 08/20/2013  . Hyperlipidemia 08/20/2013  . Depression     Well treated without meds currently    Past Surgical History  Procedure Laterality Date  . Wisdom tooth extraction      Social History FORBES LOLL  reports that he has never smoked. His smokeless tobacco use includes Chew. He reports that he drinks about 0.6 oz of alcohol per week. He reports that he does not use illicit drugs.  family history is not on file. He was adopted.  Allergies  Allergen Reactions  . Atorvastatin     RUQ pain  . Crestor [Rosuvastatin]     Elevated LFTs       PHYSICAL EXAMINATION: Vital signs: BP 110/72 mmHg  Pulse 76  Ht 5' 11"  (1.803 m)  Wt 197 lb 6.4 oz (89.54 kg)  BMI 27.54 kg/m2  Constitutional: generally well-appearing, no acute distress Psychiatric: alert and oriented x3, cooperative Eyes: extraocular movements intact, anicteric, conjunctiva pink Mouth: oral pharynx moist, no lesions Neck: supple no  lymphadenopathy Cardiovascular: heart regular rate and rhythm, no murmur Lungs: clear to auscultation bilaterally Abdomen: soft, nontender, nondistended, no obvious ascites, no peritoneal signs, normal bowel sounds, no organomegaly Rectal:No external abnormalities. Small anterior fissure. Hemoccult negative stool Extremities: no clubbing cyanosis or lower extremity edema bilaterally Skin: no lesions on visible extremities Neuro: No focal deficits. Cranial nerves intact  ASSESSMENT:  #1. Anterior fissure, anal #2. Fullness or knot sensation. Etiology to be determined #3. Intermittent rectal bleeding. Likely due to fissure. Patient requests colonoscopy. Reasonable   PLAN:  #1. Daily fiber supplementation with Metamucil #2. Sitz baths #3. Diltiazem 2% ointment 5 times daily to affected area as instructed by myself #4. Diagnostic colonoscopy. This to rule out other pathology contributing to symptoms.The nature of the procedure, as well as the risks, benefits, and alternatives were carefully and thoroughly reviewed with the patient. Ample time for discussion and questions allowed. The patient understood, was satisfied, and agreed to proceed.  A copy of this dictation has been sent to Dr. Ileene Rubens

## 2015-08-10 NOTE — Patient Instructions (Signed)
You have been scheduled for a colonoscopy. Please follow written instructions given to you at your visit today.  Please pick up your prep supplies at the pharmacy within the next 1-3 days.  If you use inhalers (even only as needed), please bring them with you on the day of your procedure.  We have sent the following medications to your pharmacy for you to pick up at your convenience: Diltiazem Gel has been sent to [facility] in [facility].  Sitz baths daily  Metamucil 2 tablespoons daily.

## 2015-08-12 ENCOUNTER — Telehealth: Payer: Self-pay | Admitting: Internal Medicine

## 2015-08-13 NOTE — Telephone Encounter (Signed)
Call and leave message to pt several times with no returned call back.

## 2015-08-16 ENCOUNTER — Encounter: Payer: BLUE CROSS/BLUE SHIELD | Admitting: Internal Medicine

## 2015-08-16 NOTE — Telephone Encounter (Signed)
No charge. 

## 2015-09-10 ENCOUNTER — Encounter: Payer: Self-pay | Admitting: Family Medicine

## 2015-09-10 ENCOUNTER — Ambulatory Visit (INDEPENDENT_AMBULATORY_CARE_PROVIDER_SITE_OTHER): Payer: BLUE CROSS/BLUE SHIELD | Admitting: Family Medicine

## 2015-09-10 VITALS — BP 114/75 | HR 90 | Wt 198.0 lb

## 2015-09-10 DIAGNOSIS — L989 Disorder of the skin and subcutaneous tissue, unspecified: Secondary | ICD-10-CM

## 2015-09-10 DIAGNOSIS — E291 Testicular hypofunction: Secondary | ICD-10-CM | POA: Diagnosis not present

## 2015-09-10 MED ORDER — TESTOSTERONE CYPIONATE 200 MG/ML IM SOLN: 200.0000 mg | INTRAMUSCULAR | Status: DC

## 2015-09-10 MED ORDER — DOXYCYCLINE HYCLATE 100 MG PO TABS: ORAL_TABLET | ORAL | Status: AC

## 2015-09-10 MED ORDER — SYRINGE (DISPOSABLE) 3 ML MISC: Status: DC

## 2015-09-10 MED ORDER — "NEEDLE (DISP) 22G X 1-1/2"" MISC": Status: DC

## 2015-09-10 NOTE — Progress Notes (Signed)
CC: Brandon Thomas is a 40 y.o. male is here for Acne   Subjective: HPI:  For the past few weeks he's been developing red spots that become tender on his face. They usually go away without any intervention however he has one above the left eyebrow that's been present for the past week. It's tender to the touch. No interventions as of yet other than waiting. He denies skin lesions elsewhere. There's been no fevers, chills nor swollen lymph nodes.  He would like refills on syringes and needles for testosterone injections. He is also beginning 10 mL bottles and unfortunately expired before he can get even 3 weeks worth of doses in.    Review Of Systems Outlined In HPI  Past Medical History  Diagnosis Date  . Elevated LFTs 08/20/2013  . Hyperlipidemia 08/20/2013  . Depression     Well treated without meds currently    Past Surgical History  Procedure Laterality Date  . Wisdom tooth extraction     Family History  Problem Relation Age of Onset  . Adopted: Yes    Social History   Social History  . Marital Status: Married    Spouse Name: N/A  . Number of Children: 2  . Years of Education: N/A   Occupational History  . sales    Social History Main Topics  . Smoking status: Never Smoker   . Smokeless tobacco: Current User    Types: Chew  . Alcohol Use: 0.6 oz/week    1 Standard drinks or equivalent per week  . Drug Use: No  . Sexual Activity:    Partners: Female   Other Topics Concern  . Not on file   Social History Narrative   Two children ages 55 and 44.  Happily married x 9 years.       Objective: BP 114/75 mmHg  Pulse 90  Wt 198 lb (89.812 kg)  Vital signs reviewed. General: Alert and Oriented, No Acute Distress HEENT: Pupils equal, round, reactive to light. Conjunctivae clear.  External ears unremarkable.  Moist mucous membranes. Lungs: Clear and comfortable work of breathing, speaking in full sentences without accessory muscle use. Cardiac: Regular rate  and rhythm.  Neuro: CN II-XII grossly intact, gait normal. Extremities: No peripheral edema.  Strong peripheral pulses.  Mental Status: No depression, anxiety, nor agitation. Logical though process. Skin: Warm and dry. 3 mm diameter slightly raised erythematous dome-shaped mass on the skin above the left eyebrow without any telangiectasia  Assessment & Plan: Brandon Thomas was seen today for acne.  Diagnoses and all orders for this visit:  Skin lesion -     doxycycline (VIBRA-TABS) 100 MG tablet; One by mouth twice a day for ten days.  Other orders -     NEEDLE, DISP, 22 G 22G X 1-1/2" MISC; Inject into skin every 14 days. -     Syringe, Disposable, 3 ML MISC; Inject into skin every 14 days. -     testosterone cypionate (DEPOTESTOSTERONE CYPIONATE) 200 MG/ML injection; Inject 1 mL (200 mg total) into the muscle every 14 (fourteen) days.   Pustule versus inflamed sebaceous cyst, start doxycycline and if no better by the middle of next week follow-up with dermatology for consideration of excision. Change testosterone prescription to only 1 mL vials.    Return if symptoms worsen or fail to improve.

## 2015-12-06 ENCOUNTER — Other Ambulatory Visit: Payer: Self-pay | Admitting: Family Medicine

## 2015-12-06 ENCOUNTER — Ambulatory Visit: Payer: BLUE CROSS/BLUE SHIELD | Admitting: Family Medicine

## 2015-12-06 DIAGNOSIS — L989 Disorder of the skin and subcutaneous tissue, unspecified: Secondary | ICD-10-CM

## 2015-12-06 NOTE — Telephone Encounter (Signed)
If the refill was requested for the spot above his left eyebrow seen in May then the plan was for him to see a dermatologist, please place referral if needed. If it is for a separate issue he would need an office visit for evaluation before prescribing anything.

## 2015-12-06 NOTE — Telephone Encounter (Signed)
Pt.notified

## 2015-12-28 ENCOUNTER — Emergency Department (HOSPITAL_COMMUNITY)
Admission: EM | Admit: 2015-12-28 | Discharge: 2015-12-28 | Discharge status: Against Medical Advice | Payer: BLUE CROSS/BLUE SHIELD

## 2019-01-20 DIAGNOSIS — Z85828 Personal history of other malignant neoplasm of skin: Secondary | ICD-10-CM | POA: Insufficient documentation

## 2019-01-20 DIAGNOSIS — F1111 Opioid abuse, in remission: Secondary | ICD-10-CM | POA: Insufficient documentation

## 2019-04-02 ENCOUNTER — Emergency Department (HOSPITAL_COMMUNITY): Payer: BLUE CROSS/BLUE SHIELD

## 2019-04-02 ENCOUNTER — Inpatient Hospital Stay (HOSPITAL_COMMUNITY)
Admission: EM | Admit: 2019-04-02 | Discharge: 2019-04-09 | DRG: 871 | Disposition: A | Discharge status: Rehabilitation Facility | Payer: BLUE CROSS/BLUE SHIELD | Attending: Internal Medicine | Admitting: Internal Medicine

## 2019-04-02 DIAGNOSIS — I248 Other forms of acute ischemic heart disease: Secondary | ICD-10-CM | POA: Diagnosis present

## 2019-04-02 DIAGNOSIS — F1910 Other psychoactive substance abuse, uncomplicated: Secondary | ICD-10-CM | POA: Diagnosis present

## 2019-04-02 DIAGNOSIS — E875 Hyperkalemia: Secondary | ICD-10-CM

## 2019-04-02 DIAGNOSIS — Z6833 Body mass index (BMI) 33.0-33.9, adult: Secondary | ICD-10-CM

## 2019-04-02 DIAGNOSIS — Z79899 Other long term (current) drug therapy: Secondary | ICD-10-CM

## 2019-04-02 DIAGNOSIS — I10 Essential (primary) hypertension: Secondary | ICD-10-CM | POA: Diagnosis present

## 2019-04-02 DIAGNOSIS — Z888 Allergy status to other drugs, medicaments and biological substances status: Secondary | ICD-10-CM

## 2019-04-02 DIAGNOSIS — F329 Major depressive disorder, single episode, unspecified: Secondary | ICD-10-CM | POA: Diagnosis present

## 2019-04-02 DIAGNOSIS — J690 Pneumonitis due to inhalation of food and vomit: Secondary | ICD-10-CM | POA: Diagnosis present

## 2019-04-02 DIAGNOSIS — E785 Hyperlipidemia, unspecified: Secondary | ICD-10-CM | POA: Diagnosis present

## 2019-04-02 DIAGNOSIS — J9602 Acute respiratory failure with hypercapnia: Secondary | ICD-10-CM | POA: Diagnosis present

## 2019-04-02 DIAGNOSIS — L0390 Cellulitis, unspecified: Secondary | ICD-10-CM | POA: Diagnosis present

## 2019-04-02 DIAGNOSIS — K7200 Acute and subacute hepatic failure without coma: Secondary | ICD-10-CM | POA: Diagnosis present

## 2019-04-02 DIAGNOSIS — A419 Sepsis, unspecified organism: Secondary | ICD-10-CM | POA: Diagnosis not present

## 2019-04-02 DIAGNOSIS — G92 Toxic encephalopathy: Secondary | ICD-10-CM | POA: Diagnosis not present

## 2019-04-02 DIAGNOSIS — M6282 Rhabdomyolysis: Secondary | ICD-10-CM | POA: Diagnosis present

## 2019-04-02 DIAGNOSIS — T40411A Poisoning by fentanyl or fentanyl analogs, accidental (unintentional), initial encounter: Secondary | ICD-10-CM | POA: Diagnosis present

## 2019-04-02 DIAGNOSIS — J9600 Acute respiratory failure, unspecified whether with hypoxia or hypercapnia: Secondary | ICD-10-CM | POA: Diagnosis present

## 2019-04-02 DIAGNOSIS — R600 Localized edema: Secondary | ICD-10-CM | POA: Diagnosis present

## 2019-04-02 DIAGNOSIS — E669 Obesity, unspecified: Secondary | ICD-10-CM | POA: Diagnosis present

## 2019-04-02 DIAGNOSIS — I82819 Embolism and thrombosis of superficial veins of unspecified lower extremities: Secondary | ICD-10-CM | POA: Diagnosis present

## 2019-04-02 DIAGNOSIS — R778 Other specified abnormalities of plasma proteins: Secondary | ICD-10-CM | POA: Diagnosis present

## 2019-04-02 DIAGNOSIS — T50901A Poisoning by unspecified drugs, medicaments and biological substances, accidental (unintentional), initial encounter: Secondary | ICD-10-CM | POA: Diagnosis present

## 2019-04-02 DIAGNOSIS — F17200 Nicotine dependence, unspecified, uncomplicated: Secondary | ICD-10-CM | POA: Diagnosis present

## 2019-04-02 DIAGNOSIS — Z20828 Contact with and (suspected) exposure to other viral communicable diseases: Secondary | ICD-10-CM | POA: Diagnosis present

## 2019-04-02 DIAGNOSIS — G9340 Encephalopathy, unspecified: Secondary | ICD-10-CM | POA: Diagnosis present

## 2019-04-02 DIAGNOSIS — L03313 Cellulitis of chest wall: Secondary | ICD-10-CM | POA: Diagnosis present

## 2019-04-02 DIAGNOSIS — F1190 Opioid use, unspecified, uncomplicated: Secondary | ICD-10-CM | POA: Diagnosis present

## 2019-04-02 DIAGNOSIS — R509 Fever, unspecified: Secondary | ICD-10-CM

## 2019-04-02 DIAGNOSIS — N179 Acute kidney failure, unspecified: Secondary | ICD-10-CM

## 2019-04-02 DIAGNOSIS — E876 Hypokalemia: Secondary | ICD-10-CM | POA: Diagnosis not present

## 2019-04-02 DIAGNOSIS — Z23 Encounter for immunization: Secondary | ICD-10-CM

## 2019-04-02 DIAGNOSIS — R4182 Altered mental status, unspecified: Secondary | ICD-10-CM

## 2019-04-02 DIAGNOSIS — I639 Cerebral infarction, unspecified: Secondary | ICD-10-CM | POA: Diagnosis present

## 2019-04-02 DIAGNOSIS — J15211 Pneumonia due to Methicillin susceptible Staphylococcus aureus: Secondary | ICD-10-CM | POA: Diagnosis not present

## 2019-04-02 DIAGNOSIS — I82611 Acute embolism and thrombosis of superficial veins of right upper extremity: Secondary | ICD-10-CM | POA: Diagnosis not present

## 2019-04-02 DIAGNOSIS — I9581 Postprocedural hypotension: Secondary | ICD-10-CM | POA: Diagnosis present

## 2019-04-02 DIAGNOSIS — Z978 Presence of other specified devices: Secondary | ICD-10-CM

## 2019-04-02 LAB — COMPREHENSIVE METABOLIC PANEL
ALT: 147 U/L — ABNORMAL HIGH (ref 0–44)
AST: 192 U/L — ABNORMAL HIGH (ref 15–41)
Albumin: 4.6 g/dL (ref 3.5–5.0)
Alkaline Phosphatase: 100 U/L (ref 38–126)
Anion gap: 22 — ABNORMAL HIGH (ref 5–15)
BUN: 21 mg/dL — ABNORMAL HIGH (ref 6–20)
CO2: 18 mmol/L — ABNORMAL LOW (ref 22–32)
Calcium: 8.9 mg/dL (ref 8.9–10.3)
Chloride: 99 mmol/L (ref 98–111)
Creatinine, Ser: 2.55 mg/dL — ABNORMAL HIGH (ref 0.61–1.24)
GFR calc Af Amer: 34 mL/min — ABNORMAL LOW (ref >60)
GFR calc non Af Amer: 29 mL/min — ABNORMAL LOW (ref >60)
Glucose, Bld: 66 mg/dL — ABNORMAL LOW (ref 70–99)
Potassium: 6.6 mmol/L (ref 3.5–5.1)
Sodium: 139 mmol/L (ref 135–145)
Total Bilirubin: 0.6 mg/dL (ref 0.3–1.2)
Total Protein: 7.9 g/dL (ref 6.5–8.1)

## 2019-04-02 LAB — CBC
HCT: 54.9 % — ABNORMAL HIGH (ref 39.0–52.0)
Hemoglobin: 16.9 g/dL (ref 13.0–17.0)
MCH: 29.5 pg (ref 26.0–34.0)
MCHC: 30.8 g/dL (ref 30.0–36.0)
MCV: 95.8 fL (ref 80.0–100.0)
Platelets: 488 10*3/uL — ABNORMAL HIGH (ref 150–400)
RBC: 5.73 MIL/uL (ref 4.22–5.81)
RDW: 13.6 % (ref 11.5–15.5)
WBC: 22.5 10*3/uL — ABNORMAL HIGH (ref 4.0–10.5)
nRBC: 0.1 % (ref 0.0–0.2)

## 2019-04-02 LAB — I-STAT CHEM 8, ED
BUN: 25 mg/dL — ABNORMAL HIGH (ref 6–20)
Calcium, Ion: 1.03 mmol/L — ABNORMAL LOW (ref 1.15–1.40)
Chloride: 106 mmol/L (ref 98–111)
Creatinine, Ser: 2.1 mg/dL — ABNORMAL HIGH (ref 0.61–1.24)
Glucose, Bld: 68 mg/dL — ABNORMAL LOW (ref 70–99)
HCT: 51 % (ref 39.0–52.0)
Hemoglobin: 17.3 g/dL — ABNORMAL HIGH (ref 13.0–17.0)
Potassium: 5.6 mmol/L — ABNORMAL HIGH (ref 3.5–5.1)
Sodium: 140 mmol/L (ref 135–145)
TCO2: 23 mmol/L (ref 22–32)

## 2019-04-02 LAB — POCT I-STAT 7, (LYTES, BLD GAS, ICA,H+H)
Acid-base deficit: 5 mmol/L — ABNORMAL HIGH (ref 0.0–2.0)
Bicarbonate: 22.4 mmol/L (ref 20.0–28.0)
Calcium, Ion: 1.1 mmol/L — ABNORMAL LOW (ref 1.15–1.40)
HCT: 47 % (ref 39.0–52.0)
Hemoglobin: 16 g/dL (ref 13.0–17.0)
O2 Saturation: 97 %
Patient temperature: 98.6
Potassium: 6.4 mmol/L (ref 3.5–5.1)
Sodium: 137 mmol/L (ref 135–145)
TCO2: 24 mmol/L (ref 22–32)
pCO2 arterial: 50.4 mmHg — ABNORMAL HIGH (ref 32.0–48.0)
pH, Arterial: 7.257 — ABNORMAL LOW (ref 7.350–7.450)
pO2, Arterial: 106 mmHg (ref 83.0–108.0)

## 2019-04-02 LAB — RAPID URINE DRUG SCREEN, HOSP PERFORMED
Amphetamines: NOT DETECTED
Barbiturates: NOT DETECTED
Benzodiazepines: POSITIVE — AB
Cocaine: NOT DETECTED
Opiates: NOT DETECTED
Tetrahydrocannabinol: NOT DETECTED

## 2019-04-02 LAB — PROTIME-INR
INR: 1.1 (ref 0.8–1.2)
Prothrombin Time: 13.6 seconds (ref 11.4–15.2)

## 2019-04-02 LAB — URINALYSIS, ROUTINE W REFLEX MICROSCOPIC
Bilirubin Urine: NEGATIVE
Glucose, UA: >=500 mg/dL — AB
Ketones, ur: NEGATIVE mg/dL
Leukocytes,Ua: NEGATIVE
Nitrite: NEGATIVE
Protein, ur: 100 mg/dL — AB
Specific Gravity, Urine: 1.015 (ref 1.005–1.030)
pH: 5 (ref 5.0–8.0)

## 2019-04-02 LAB — SARS CORONAVIRUS 2 BY RT PCR (HOSPITAL ORDER, PERFORMED IN ~~LOC~~ HOSPITAL LAB): SARS Coronavirus 2: NEGATIVE

## 2019-04-02 LAB — SAMPLE TO BLOOD BANK

## 2019-04-02 LAB — CK: Total CK: 9495 U/L — ABNORMAL HIGH (ref 49–397)

## 2019-04-02 LAB — ETHANOL: Alcohol, Ethyl (B): <10 mg/dL (ref <10)

## 2019-04-02 LAB — LACTIC ACID, PLASMA: Lactic Acid, Venous: 8.8 mmol/L (ref 0.5–1.9)

## 2019-04-02 LAB — CDS SEROLOGY

## 2019-04-02 LAB — POC SARS CORONAVIRUS 2 AG -  ED: SARS Coronavirus 2 Ag: NEGATIVE

## 2019-04-02 MED ORDER — FENTANYL 2500MCG IN NS 250ML (10MCG/ML) PREMIX INFUSION
25.0000 ug/h | INTRAVENOUS | Status: DC
  Administered 2019-04-03: 200 ug/h via INTRAVENOUS
  Administered 2019-04-03: 50 ug/h via INTRAVENOUS
  Administered 2019-04-04: 400 ug/h via INTRAVENOUS
  Filled 2019-04-02 (×3): qty 250

## 2019-04-02 MED ORDER — SODIUM CHLORIDE 0.9 % IV SOLN: 2000.0000 mg | Freq: Once | INTRAVENOUS | Status: DC

## 2019-04-02 MED ORDER — VANCOMYCIN HCL 10 G IV SOLR
2500.0000 mg | Freq: Once | INTRAVENOUS | Status: AC
  Administered 2019-04-03: 2500 mg via INTRAVENOUS
  Filled 2019-04-02: qty 2500

## 2019-04-02 MED ORDER — PIPERACILLIN-TAZOBACTAM 3.375 G IVPB 30 MIN
3.3750 g | Freq: Once | INTRAVENOUS | Status: AC
  Administered 2019-04-03: 3.375 g via INTRAVENOUS
  Filled 2019-04-02: qty 50

## 2019-04-02 MED ORDER — INSULIN ASPART 100 UNIT/ML IV SOLN
10.0000 [IU] | Freq: Once | INTRAVENOUS | Status: AC
  Administered 2019-04-02: 10 [IU] via INTRAVENOUS

## 2019-04-02 MED ORDER — PROPOFOL 1000 MG/100ML IV EMUL
0.0000 ug/kg/min | INTRAVENOUS | Status: DC
  Administered 2019-04-02: 22:00:00 20 ug/kg/min via INTRAVENOUS

## 2019-04-02 MED ORDER — DEXTROSE 50 % IV SOLN
1.0000 | Freq: Once | INTRAVENOUS | Status: AC
  Administered 2019-04-02: 50 mL via INTRAVENOUS
  Filled 2019-04-02: qty 50

## 2019-04-02 MED ORDER — ROCURONIUM BROMIDE 50 MG/5ML IV SOLN
INTRAVENOUS | Status: AC | PRN
  Administered 2019-04-02: 70 mg via INTRAVENOUS

## 2019-04-02 MED ORDER — LEVETIRACETAM IN NACL 1000 MG/100ML IV SOLN: 1000.0000 mg | Freq: Once | INTRAVENOUS | Status: DC

## 2019-04-02 MED ORDER — MIDAZOLAM HCL 2 MG/2ML IJ SOLN
5.0000 mg | Freq: Once | INTRAMUSCULAR | Status: AC
  Administered 2019-04-02: 5 mg via INTRAVENOUS

## 2019-04-02 MED ORDER — SODIUM BICARBONATE 8.4 % IV SOLN
50.0000 meq | Freq: Once | INTRAVENOUS | Status: AC
  Administered 2019-04-02: 50 meq via INTRAVENOUS

## 2019-04-02 MED ORDER — MIDAZOLAM HCL 2 MG/2ML IJ SOLN
INTRAMUSCULAR | Status: AC
  Administered 2019-04-02: 2 mg via INTRAVENOUS
  Filled 2019-04-02: qty 10

## 2019-04-02 MED ORDER — CALCIUM GLUCONATE-NACL 1-0.675 GM/50ML-% IV SOLN
1.0000 g | Freq: Once | INTRAVENOUS | Status: AC
  Administered 2019-04-02: 1000 mg via INTRAVENOUS
  Filled 2019-04-02: qty 50

## 2019-04-02 MED ORDER — ETOMIDATE 2 MG/ML IV SOLN
INTRAVENOUS | Status: AC | PRN
  Administered 2019-04-02: 20 mg via INTRAVENOUS
  Administered 2019-04-03: 06:00:00 via INTRAVENOUS

## 2019-04-02 MED ORDER — MIDAZOLAM 50MG/50ML (1MG/ML) PREMIX INFUSION
0.5000 mg/h | INTRAVENOUS | Status: DC
  Administered 2019-04-03: 0.5 mg/h via INTRAVENOUS
  Filled 2019-04-02: qty 50

## 2019-04-02 MED ORDER — MIDAZOLAM HCL 2 MG/2ML IJ SOLN
2.0000 mg | Freq: Once | INTRAMUSCULAR | Status: AC
  Administered 2019-04-02: 2 mg via INTRAVENOUS

## 2019-04-02 MED ORDER — PROPOFOL 1000 MG/100ML IV EMUL
INTRAVENOUS | Status: AC
  Administered 2019-04-02: 20 ug/kg/min via INTRAVENOUS
  Filled 2019-04-02: qty 100

## 2019-04-02 MED ORDER — SODIUM CHLORIDE 0.9 % IV BOLUS
1000.0000 mL | Freq: Once | INTRAVENOUS | Status: AC
  Administered 2019-04-02: 1000 mL via INTRAVENOUS

## 2019-04-02 MED ORDER — LACTATED RINGERS IV BOLUS
2000.0000 mL | Freq: Once | INTRAVENOUS | Status: AC
  Administered 2019-04-03: 2000 mL via INTRAVENOUS

## 2019-04-02 MED ORDER — FENTANYL BOLUS VIA INFUSION
50.0000 ug | INTRAVENOUS | Status: DC | PRN
  Administered 2019-04-03 – 2019-04-04 (×7): 50 ug via INTRAVENOUS
  Filled 2019-04-02: qty 50

## 2019-04-02 MED ORDER — PROPOFOL 1000 MG/100ML IV EMUL
5.0000 ug/kg/min | INTRAVENOUS | Status: DC
  Administered 2019-04-02: 10 ug/kg/min via INTRAVENOUS

## 2019-04-02 MED ORDER — SODIUM CHLORIDE 0.9 % IV SOLN: 1.0000 g | Freq: Once | INTRAVENOUS | Status: DC

## 2019-04-02 MED ORDER — SODIUM BICARBONATE 8.4 % IV SOLN
INTRAVENOUS | Status: AC
  Filled 2019-04-02: qty 50

## 2019-04-02 MED ORDER — LEVETIRACETAM IN NACL 1000 MG/100ML IV SOLN
1000.0000 mg | Freq: Once | INTRAVENOUS | Status: AC
  Administered 2019-04-02: 1000 mg via INTRAVENOUS
  Filled 2019-04-02: qty 100

## 2019-04-02 NOTE — Procedures (Signed)
Intubation Procedure Note Brandon Thomas [id number] 1975-07-16  Procedure: Intubation Indications: Airway protection and maintenance  Procedure Details Consent: Risks of procedure as well as the alternatives and risks of each were explained to the (patient/caregiver).  Consent for procedure obtained.  Time Out: Verified patient identification, verified procedure, site/side was marked, verified correct patient position, special equipment/implants available, medications/allergies/relevent history reviewed, required imaging and test results available.  Performed  Maximum sterile technique was used including cap, gloves, gown, hand hygiene and mask.  MAC and 3    Evaluation Hemodynamic Status: BP stable throughout; O2 sats: stable throughout Patient's Current Condition: stable Complications: No apparent complications Patient did tolerate procedure well. Chest X-ray ordered to verify placement.  CXR: pending.   Brandon Thomas 04/02/2019

## 2019-04-02 NOTE — ED Notes (Signed)
Patient is hypotensive with diprivan, becoming diaphoretic and awake. Provider notified.

## 2019-04-02 NOTE — ED Triage Notes (Signed)
Wife found pt on floor, (has hx of polysubstance abuse). [employer] gave Narcan given 25m. EMS gave 582mVersed due to what appeared to be a seizure - described as decorticate posturing. Wife last saw pt at 11:30 this am and found him this evening @19 :00

## 2019-04-02 NOTE — ED Provider Notes (Addendum)
[facility] EMERGENCY DEPARTMENT Provider Note   CSN: [id number] Arrival date & time: 04/02/19  2049     History   Chief Complaint Chief Complaint  Patient presents with   Drug Overdose    HPI Brandon Thomas is a 43 y.o. male with a history of polysubstance abuse, recently out of rehab, presenting to emergency department unresponsiveness.  History is provided by EMS and later by the patient's wife as the patient was unresponsive on arrival.  His wife reports that she left the patient in usual state of health around 8 AM today.  He had no complaints prior to that.  When she returned home from work around [place] she found him face down on the floor, blowing air out of his mouth, but unresponsive to her.  She says he was blue and swollen in the face.  She rolled him over on his back and began giving CPR.  The neighbor was a fireman arrived and gave the patient some Narcan.  There is no immediate response.  EMS arrived report the patient was breathing spontaneously.  In route to the hospital they said the patient was "posturing" with both arms extended down to his side.  They were concerned this may be a seizure and given 5 mg of Versed prior to his arrival.  Patient's wife denies that he has any medical problems aside from polysubstance abuse.  She is concerned that he began using drugs again, because she found drug dealers number on his phone.  She says he uses IV heroin, methamphetamines, cocaine," pretty much any drug you can find".  She also reports that the patient is "getting pills from [place] overseas".  She is not sure what these medicines may be  She denies that he has had any history of seizure.  HPI  Past Medical History:  Diagnosis Date   Depression    Well treated without meds currently   Elevated LFTs 08/20/2013   Hyperlipidemia 08/20/2013    Patient Active Problem List   Diagnosis Date Noted   Anal fissure 04/15/2015   Hypogonadism in male  04/13/2015   Chest pain 04/13/2015   Snoring 04/13/2015   Fatigue 04/13/2015   Dyslipidemia 04/13/2015   Insomnia 07/17/2014   Anxiety 09/08/2013   Hyperlipidemia 08/20/2013   Elevated LFTs 08/20/2013   Exertional chest pain 08/20/2013    Past Surgical History:  Procedure Laterality Date   WISDOM TOOTH EXTRACTION          Home Medications    Prior to Admission medications   Medication Sig Start Date End Date Taking? Authorizing Provider  diltiazem 2 % GEL Apply 1 application topically 5 (five) times daily. 08/10/15   Irene Shipper, MD  Multiple Vitamin (MULTIVITAMIN) tablet Take 1 tablet by mouth daily.    [provider]  Na Sulfate-K Sulfate-Mg Sulf SOLN Take 1 kit by mouth once. 08/10/15   Irene Shipper, MD  NEEDLE, DISP, 22 G 22G X 1-1/2" MISC Inject into skin every 14 days. 09/10/15   Marcial Pacas, DO  Omega-3 Fatty Acids (FISH OIL PO) Take by mouth 2 (two) times daily.    [provider]  pravastatin (PRAVACHOL) 20 MG tablet Take 1 tablet (20 mg total) by mouth daily. 06/29/15   Marcial Pacas, DO  Syringe, Disposable, 3 ML MISC Inject into skin every 14 days. 09/10/15   Marcial Pacas, DO  testosterone cypionate (DEPOTESTOSTERONE CYPIONATE) 200 MG/ML injection Inject 1 mL (200 mg total) into the muscle  every 14 (fourteen) days. 09/10/15   Marcial Pacas, DO    Family History Family History  Adopted: Yes    Social History Social History   Tobacco Use   Smoking status: Never Smoker   Smokeless tobacco: Current User    Types: Chew  Substance Use Topics   Alcohol use: Yes    Alcohol/week: 1.0 standard drinks    Types: 1 Standard drinks or equivalent per week   Drug use: No     Allergies   Atorvastatin and Crestor [rosuvastatin]   Review of Systems Review of Systems  Unable to perform ROS: Intubated  All other systems reviewed and are negative.    Physical Exam Updated Vital Signs BP (!) 68/13    Pulse (!) 122    Temp (!) 100.7 F  (38.2 C)    Resp 20    Ht 5' 10"  (1.778 m)    Wt 104.3 kg    SpO2 95%    BMI 33.00 kg/m   Physical Exam Vitals signs and nursing note reviewed.  Constitutional:      Appearance: He is well-developed. He is diaphoretic.  HENT:     Head: Normocephalic. No raccoon eyes or Battle's sign.     Comments: Scratches to face Superficial hematoma on mid forehead Eyes:     Pupils: Pupils are equal, round, and reactive to light.     Comments: Pupils 5 mm bilaterally and reactive to light  Neck:     Comments: C spine collar in place Cardiovascular:     Rate and Rhythm: Regular rhythm. Tachycardia present.     Pulses: Normal pulses.  Pulmonary:     Effort: Pulmonary effort is normal. No respiratory distress.     Comments: 96% on room air Abdominal:     General: There is no distension.     Palpations: Abdomen is soft.  Musculoskeletal:        General: No deformity.  Skin:    General: Skin is warm.     Comments: Injection marks in bilateral a/c  Neurological:     Mental Status: He is alert.     GCS: GCS eye subscore is 1. GCS verbal subscore is 2. GCS motor subscore is 4.      ED Treatments / Results  Labs (all labs ordered are listed, but only abnormal results are displayed) Labs Reviewed  COMPREHENSIVE METABOLIC PANEL - Abnormal; Notable for the following components:      Result Value   Potassium 6.6 (*)    CO2 18 (*)    Glucose, Bld 66 (*)    BUN 21 (*)    Creatinine, Ser 2.55 (*)    AST 192 (*)    ALT 147 (*)    GFR calc non Af Amer 29 (*)    GFR calc Af Amer 34 (*)    Anion gap 22 (*)    All other components within normal limits  CBC - Abnormal; Notable for the following components:   WBC 22.5 (*)    HCT 54.9 (*)    Platelets 488 (*)    All other components within normal limits  URINALYSIS, ROUTINE W REFLEX MICROSCOPIC - Abnormal; Notable for the following components:   APPearance CLOUDY (*)    Glucose, UA >=500 (*)    Hgb urine dipstick LARGE (*)    Protein, ur  100 (*)    Bacteria, UA RARE (*)    All other components within normal limits  LACTIC ACID, PLASMA - Abnormal;  Notable for the following components:   Lactic Acid, Venous 8.8 (*)    All other components within normal limits  RAPID URINE DRUG SCREEN, HOSP PERFORMED - Abnormal; Notable for the following components:   Benzodiazepines POSITIVE (*)    All other components within normal limits  CK - Abnormal; Notable for the following components:   Total CK 9,495 (*)    All other components within normal limits  I-STAT CHEM 8, ED - Abnormal; Notable for the following components:   Potassium 5.6 (*)    BUN 25 (*)    Creatinine, Ser 2.10 (*)    Glucose, Bld 68 (*)    Calcium, Ion 1.03 (*)    Hemoglobin 17.3 (*)    All other components within normal limits  POCT I-STAT 7, (LYTES, BLD GAS, ICA,H+H) - Abnormal; Notable for the following components:   pH, Arterial 7.257 (*)    pCO2 arterial 50.4 (*)    Acid-base deficit 5.0 (*)    Potassium 6.4 (*)    Calcium, Ion 1.10 (*)    All other components within normal limits  SARS CORONAVIRUS 2 BY RT PCR (HOSPITAL ORDER, [facility])  CULTURE, BLOOD (ROUTINE X 2)  CULTURE, BLOOD (ROUTINE X 2)  ETHANOL  CDS SEROLOGY  PROTIME-INR  APTT  LACTIC ACID, PLASMA  POC SARS CORONAVIRUS 2 AG -  ED  I-STAT ARTERIAL BLOOD GAS, ED  CBG MONITORING, ED  SAMPLE TO BLOOD BANK    EKG None  Radiology Ct Head Wo Contrast  Result Date: 04/02/2019 CLINICAL DATA:  Overdose EXAM: CT HEAD WITHOUT CONTRAST CT CERVICAL SPINE WITHOUT CONTRAST TECHNIQUE: Multidetector CT imaging of the head and cervical spine was performed following the standard protocol without intravenous contrast. Multiplanar CT image reconstructions of the cervical spine were also generated. COMPARISON:  None. FINDINGS: CT HEAD FINDINGS Brain: Preservation of the gray-white differentiation. No convincing features global cerebral edema or diffuse anoxic injury. No evidence  of acute infarction, hemorrhage, hydrocephalus, extra-axial collection or mass lesion/mass effect. Vascular: Atherosclerotic calcification seen in the carotid siphons. No unexpected hyperdense vessel. Skull: Mild right frontal and supraorbital scalp swelling without subjacent calvarial fracture. No suspicious osseous lesions. Sinuses/Orbits: Minimal mural thickening in the left maxillary sinus. Remaining paranasal sinuses and mastoids are predominantly clear. Included orbital structures are unremarkable. Other: Transesophageal and endotracheal tubes in place. CT CERVICAL SPINE FINDINGS Alignment: Imaging quality of the cervical spine degraded by motion artifact. Preservation of the normal cervical lordosis without gross traumatic listhesis. No abnormal facet widening. Normal alignment of the craniocervical and atlantoaxial articulations. Skull base and vertebrae: Motion artifact may limit detection of subtle, nondisplaced fractures. No definite acute fracture or suspicious osseous lesion. Soft tissues and spinal canal: No pre or paravertebral fluid or swelling. No visible canal hematoma. Disc levels: Minimal cervical spondylitic changes maximally at C5-6 and C6-7 with small posterior disc osteophyte complexes resulting in mild canal stenosis and uncinate spurring with hypertrophic facet changes at C6-7 resulting in mild to moderate bilateral foraminal narrowing. No severe canal or foraminal stenosis in the cervical spine Upper chest: Atelectatic changes noted dependently in the lungs. Other: None IMPRESSION: 1. No acute intracranial abnormality. 2. Mild right frontal and supraorbital scalp swelling without subjacent calvarial fracture. 3. Motion artifact degrades of cervical spine imaging quality. May limit detection of subtle, nondisplaced fractures. No definite acute cervical spine fracture or traumatic listhesis. 4. Minimal cervical spondylitic changes, as above. Electronically Signed   By: Elwin Sleight.D.  On: 04/02/2019 22:42   Ct Chest Wo Contrast  Result Date: 04/02/2019 CLINICAL DATA:  Overdose EXAM: CT CHEST WITHOUT CONTRAST TECHNIQUE: Multidetector CT imaging of the chest was performed following the standard protocol without IV contrast. COMPARISON:  Radiograph 04/02/2019 FINDINGS: Cardiovascular: Normal heart size. No pericardial effusion. Normal central pulmonary arterial caliber. Normal caliber of the thoracic aorta. Normal 3 vessel branching of the aortic arch. Mediastinum/Nodes: Endotracheal tube terminates 3.2 cm from the carina. A transesophageal tube tip terminates within the gastric lumen with the side port distal to the GE junction. No acute trachea or esophageal abnormality. No mediastinal or axillary adenopathy. Hilar lymph nodes are difficult to assess in the absence of contrast media. No mediastinal hemorrhage or gas. Thyroid gland and thoracic inlet are unremarkable. Lungs/Pleura: Extensive areas of consolidation and volume loss seen posteriorly within the lungs with some air bronchograms. Some secretions are noted distal to the endotracheal balloon. No pneumothorax. No convincing effusion. No suspicious nodules or masses. Upper Abdomen: No acute abnormalities present in the visualized portions of the upper abdomen. Musculoskeletal: No chest wall mass or suspicious bone lesions identified. IMPRESSION: 1. Extensive areas of consolidation and volume loss posteriorly within the lungs with some air bronchograms, findings may reflect marked hypoventilatory changes in the setting of overdose though some underlying infection and/or sequela of aspiration may be present as well particularly given the airways thickening and secretions below the endotracheal balloon. 2. Endotracheal tube terminates 3.2 cm from the carina. 3. Transesophageal tube tip terminates within the gastric lumen with the side port distal to the GE junction. Electronically Signed   By: Lovena Le M.D.   On: 04/02/2019 22:46    Ct Cervical Spine Wo Contrast  Result Date: 04/02/2019 CLINICAL DATA:  Overdose EXAM: CT HEAD WITHOUT CONTRAST CT CERVICAL SPINE WITHOUT CONTRAST TECHNIQUE: Multidetector CT imaging of the head and cervical spine was performed following the standard protocol without intravenous contrast. Multiplanar CT image reconstructions of the cervical spine were also generated. COMPARISON:  None. FINDINGS: CT HEAD FINDINGS Brain: Preservation of the gray-white differentiation. No convincing features global cerebral edema or diffuse anoxic injury. No evidence of acute infarction, hemorrhage, hydrocephalus, extra-axial collection or mass lesion/mass effect. Vascular: Atherosclerotic calcification seen in the carotid siphons. No unexpected hyperdense vessel. Skull: Mild right frontal and supraorbital scalp swelling without subjacent calvarial fracture. No suspicious osseous lesions. Sinuses/Orbits: Minimal mural thickening in the left maxillary sinus. Remaining paranasal sinuses and mastoids are predominantly clear. Included orbital structures are unremarkable. Other: Transesophageal and endotracheal tubes in place. CT CERVICAL SPINE FINDINGS Alignment: Imaging quality of the cervical spine degraded by motion artifact. Preservation of the normal cervical lordosis without gross traumatic listhesis. No abnormal facet widening. Normal alignment of the craniocervical and atlantoaxial articulations. Skull base and vertebrae: Motion artifact may limit detection of subtle, nondisplaced fractures. No definite acute fracture or suspicious osseous lesion. Soft tissues and spinal canal: No pre or paravertebral fluid or swelling. No visible canal hematoma. Disc levels: Minimal cervical spondylitic changes maximally at C5-6 and C6-7 with small posterior disc osteophyte complexes resulting in mild canal stenosis and uncinate spurring with hypertrophic facet changes at C6-7 resulting in mild to moderate bilateral foraminal narrowing. No  severe canal or foraminal stenosis in the cervical spine Upper chest: Atelectatic changes noted dependently in the lungs. Other: None IMPRESSION: 1. No acute intracranial abnormality. 2. Mild right frontal and supraorbital scalp swelling without subjacent calvarial fracture. 3. Motion artifact degrades of cervical spine imaging quality. May limit detection of  subtle, nondisplaced fractures. No definite acute cervical spine fracture or traumatic listhesis. 4. Minimal cervical spondylitic changes, as above. Electronically Signed   By: Lovena Le M.D.   On: 04/02/2019 22:42   Dg Pelvis Portable  Result Date: 04/02/2019 CLINICAL DATA:  Found down, possible seizure. EXAM: PORTABLE PELVIS 1-2 VIEWS COMPARISON:  None. FINDINGS: The cortical margins of the bony pelvis are intact. No fracture. Pubic symphysis and sacroiliac joints are congruent. Both femoral heads are well-seated in the respective acetabula. Rectal tube/temperature probe projects centrally over the pelvis. IMPRESSION: Unremarkable radiograph of the pelvis. Electronically Signed   By: Keith Rake M.D.   On: 04/02/2019 21:38   Dg Chest Port 1 View  Result Date: 04/02/2019 CLINICAL DATA:  Verify endotracheal and OG tube placement. Found down. Possible seizure. EXAM: PORTABLE CHEST 1 VIEW COMPARISON:  None. FINDINGS: Endotracheal tube tip 4.6 cm from the carina at the thoracic inlet. Tip and side port of the enteric tube below the diaphragm in the stomach. Lung volumes are low. Upper normal heart size, likely accentuated by technique. Mild patchy bilateral perihilar opacities, left greater than right. No large pleural effusion or pneumothorax. No acute osseous abnormalities are seen. IMPRESSION: 1. Endotracheal tube tip 4.6 cm from the carina at the thoracic inlet. 2. Enteric tube tip and side port below the diaphragm in the stomach. 3. Low lung volumes with mild patchy bilateral perihilar opacities, left greater than right. Findings may be  atelectasis, pneumonia, or aspiration. Electronically Signed   By: Keith Rake M.D.   On: 04/02/2019 21:39    Procedures .Critical Care Performed by: Wyvonnia Dusky, MD Authorized by: Wyvonnia Dusky, MD   Critical care provider statement:    Critical care time (minutes):  76   Critical care was necessary to treat or prevent imminent or life-threatening deterioration of the following conditions:  Sepsis, shock and CNS failure or compromise   Critical care was time spent personally by me on the following activities:  Discussions with consultants, evaluation of patient's response to treatment, examination of patient, ordering and performing treatments and interventions, ordering and review of laboratory studies, ordering and review of radiographic studies, pulse oximetry, re-evaluation of patient's condition, obtaining history from patient or surrogate and review of old charts Comments:     Unresponsive patient requiring trauma evaluation, intubation, IV fluids, IV sedation, discussion with neurology, EKG interpretation, and discussion with family member Patient also hyperkalemic running IV calcium and IV insulin Patient also septic requiring IV antibiotics  Procedure Name: Intubation Date/Time: 04/03/2019 9:55 AM Performed by: Wyvonnia Dusky, MD Pre-anesthesia Checklist: Patient identified, Patient being monitored, Emergency Drugs available, Timeout performed and Suction available Oxygen Delivery Method: Non-rebreather mask Preoxygenation: Pre-oxygenation with 100% oxygen Induction Type: Rapid sequence Ventilation: Mask ventilation without difficulty Laryngoscope Size: Glidescope Tube size: 7.5 mm Number of attempts: 1 Airway Equipment and Method: Video-laryngoscopy Placement Confirmation: ETT inserted through vocal cords under direct vision,  CO2 detector,  Breath sounds checked- equal and bilateral and Positive ETCO2 Secured at: 22 cm Tube secured with: ETT  holder      (including critical care time)  Medications Ordered in ED Medications  propofol (DIPRIVAN) 1000 MG/100ML infusion (0 mcg/kg/min  104.3 kg Intravenous Stopped 04/02/19 2255)  levETIRAcetam (KEPPRA) IVPB 1000 mg/100 mL premix (0 mg Intravenous Stopped 04/02/19 2344)    And  levETIRAcetam (KEPPRA) IVPB 1000 mg/100 mL premix (0 mg Intravenous Hold 04/02/19 2322)  midazolam (VERSED) 50 mg/50 mL (1 mg/mL) premix infusion (0.5 mg/hr  Intravenous New Bag/Given 04/03/19 0012)  vancomycin (VANCOCIN) 2,500 mg in sodium chloride 0.9 % 500 mL IVPB (2,500 mg Intravenous New Bag/Given 04/03/19 0041)  piperacillin-tazobactam (ZOSYN) IVPB 3.375 g (has no administration in time range)  fentaNYL 2537mg in NS 2576m(1037mml) infusion-PREMIX (50 mcg/hr Intravenous New Bag/Given 04/03/19 0011)  fentaNYL (SUBLIMAZE) bolus via infusion 50 mcg (has no administration in time range)  etomidate (AMIDATE) injection (20 mg Intravenous Given 04/02/19 2106)  rocuronium (ZEMURON) injection (70 mg Intravenous Given 04/02/19 2107)  insulin aspart (novoLOG) injection 10 Units (10 Units Intravenous Given 04/02/19 2323)    And  dextrose 50 % solution 50 mL (50 mLs Intravenous Given 04/02/19 2323)  sodium chloride 0.9 % bolus 1,000 mL (0 mLs Intravenous Stopped 04/03/19 0003)  sodium bicarbonate injection 50 mEq (50 mEq Intravenous Given 04/02/19 2323)  calcium gluconate 1 g/ 50 mL sodium chloride IVPB (0 g Intravenous Stopped 04/02/19 2357)  midazolam (VERSED) injection 5 mg (5 mg Intravenous Given 04/02/19 2300)  lactated ringers bolus 2,000 mL (2,000 mLs Intravenous New Bag/Given 04/03/19 0003)  midazolam (VERSED) injection 2 mg (2 mg Intravenous Given 04/02/19 2358)     Initial Impression / Assessment and Plan / ED Course  I have reviewed the triage vital signs and the nursing notes.  Pertinent labs & imaging results that were available during my care of the patient were reviewed by me and considered  in my medical decision making (see chart for details).  This is a 43 7ar old male with a history of polysubstance abuse who presented unresponsive after being found down at home by his wife.  It is unclear how long he was down, although it may have been as long as 8 to 10 hours.  He was given Narcan at home.  He did not seem to respond to that but he does have dilated pupils here and is diaphoretic It is unclear if this is a toxidrome or polysubstance overdose, with concern [place] (synthetic fent[place]?).  High on the differential is also prolonged seizure, given the patient's lactic acidosis and elevated CPK, as well as his elevated creatinine and hyperkalemia.  Patient was intubated on arrival for his diminished GCS.  My priority was obtain a CT scan to rule out the possibility of a brain bleed, brain aneurysm, subarachnoid hemorrhage as a cause of his symptoms.  Secondary concern is that this may be status epilepticus and a prolonged seizure, and I reached out to neurology as noted in the ED course below.  Subsequent labs showed that he was hyperkalemic, and was given treatment for this.  Although he was initially afebrile per rectal temp, he did develop a fever while he was here.  Again, it is unclear again if this is toxidrome or neurogenic fever.  It is also possibly infectious, or possibly related to aspiration while he was at home.  We will initiate a sepsis protocol and treat him empircally with antibiotics.  This note was dictated using dragon dictation software.  Please be aware that there may be minor translation errors as a result of this oral dictation   Clinical Course as of Apr 02 45  Wed Apr 02, 2019  2128 Temp 98.65F rectal.. Pt intubated, wife informed of status in waiting room, next to CT scan   [MT]  2309 Patient came hypotensive on return from CT scan, SBP 70. I instructed his nurse to stop the propofol and began an IV fentanyl  and Versed  drip.   [MT]  2310 I spoke to Dr. Lorraine Lax he neurologist recommends an EEG and is agreeable to an IV Keppra loading dose.  I also spoke to the intensivist Dr. Estanislado Emms who will send a physician to assess the patient.  Finally the patient's wife was updated at the bedside.   [MT]  Thu Apr 03, 2019  0035 Pt signed out to Dr Dolly Rias, awaiting ICU evaluation and admission.  Initiated sepsis w/u and antibiotics for fever - although this could be neurogenic as well, at this time empiric coverage appears prudent.  Will also give stress dose steroids after discussion with Dr Dolly Rias.  It appears the patient does use exogenous steroids once weekly at home per his wife.   [MT]  0037 Most recent BP 83/33.  68/13 likely in error as BP cuff slid down forearm over elbow.   [MT]    Clinical Course User Index [MT] Meah Jiron, Carola Rhine, MD       Final Clinical Impressions(s) / ED Diagnoses   Final diagnoses:  Altered mental status, unspecified altered mental status type  Hyperkalemia  Acute kidney injury ([place])  Non-traumatic rhabdomyolysis  Fever, unspecified fever cause    ED Discharge Orders    None       Usher Hedberg, Carola Rhine, MD 04/03/19 5997    Wyvonnia Dusky, MD 04/03/19 [phone]

## 2019-04-02 NOTE — Progress Notes (Signed)
Patient transported to and from CT without complications.

## 2019-04-02 NOTE — ED Notes (Signed)
Chaplain paged

## 2019-04-03 ENCOUNTER — Other Ambulatory Visit: Payer: Self-pay

## 2019-04-03 ENCOUNTER — Inpatient Hospital Stay (HOSPITAL_COMMUNITY): Payer: BLUE CROSS/BLUE SHIELD

## 2019-04-03 DIAGNOSIS — I10 Essential (primary) hypertension: Secondary | ICD-10-CM | POA: Diagnosis present

## 2019-04-03 DIAGNOSIS — Z23 Encounter for immunization: Secondary | ICD-10-CM | POA: Diagnosis not present

## 2019-04-03 DIAGNOSIS — T50901A Poisoning by unspecified drugs, medicaments and biological substances, accidental (unintentional), initial encounter: Secondary | ICD-10-CM | POA: Diagnosis not present

## 2019-04-03 DIAGNOSIS — G931 Anoxic brain damage, not elsewhere classified: Secondary | ICD-10-CM | POA: Diagnosis not present

## 2019-04-03 DIAGNOSIS — I330 Acute and subacute infective endocarditis: Secondary | ICD-10-CM | POA: Diagnosis not present

## 2019-04-03 DIAGNOSIS — T50901D Poisoning by unspecified drugs, medicaments and biological substances, accidental (unintentional), subsequent encounter: Secondary | ICD-10-CM | POA: Diagnosis not present

## 2019-04-03 DIAGNOSIS — J9601 Acute respiratory failure with hypoxia: Secondary | ICD-10-CM | POA: Diagnosis not present

## 2019-04-03 DIAGNOSIS — M6282 Rhabdomyolysis: Secondary | ICD-10-CM | POA: Diagnosis present

## 2019-04-03 DIAGNOSIS — R4182 Altered mental status, unspecified: Secondary | ICD-10-CM | POA: Diagnosis not present

## 2019-04-03 DIAGNOSIS — I9581 Postprocedural hypotension: Secondary | ICD-10-CM | POA: Diagnosis present

## 2019-04-03 DIAGNOSIS — I639 Cerebral infarction, unspecified: Secondary | ICD-10-CM | POA: Diagnosis present

## 2019-04-03 DIAGNOSIS — J690 Pneumonitis due to inhalation of food and vomit: Secondary | ICD-10-CM | POA: Diagnosis present

## 2019-04-03 DIAGNOSIS — I248 Other forms of acute ischemic heart disease: Secondary | ICD-10-CM | POA: Diagnosis present

## 2019-04-03 DIAGNOSIS — F329 Major depressive disorder, single episode, unspecified: Secondary | ICD-10-CM | POA: Diagnosis present

## 2019-04-03 DIAGNOSIS — J9602 Acute respiratory failure with hypercapnia: Secondary | ICD-10-CM | POA: Diagnosis present

## 2019-04-03 DIAGNOSIS — I82819 Embolism and thrombosis of superficial veins of unspecified lower extremities: Secondary | ICD-10-CM | POA: Diagnosis present

## 2019-04-03 DIAGNOSIS — K7200 Acute and subacute hepatic failure without coma: Secondary | ICD-10-CM | POA: Diagnosis present

## 2019-04-03 DIAGNOSIS — L03113 Cellulitis of right upper limb: Secondary | ICD-10-CM | POA: Diagnosis not present

## 2019-04-03 DIAGNOSIS — F1910 Other psychoactive substance abuse, uncomplicated: Secondary | ICD-10-CM | POA: Diagnosis not present

## 2019-04-03 DIAGNOSIS — J15211 Pneumonia due to Methicillin susceptible Staphylococcus aureus: Secondary | ICD-10-CM | POA: Diagnosis not present

## 2019-04-03 DIAGNOSIS — T796XXS Traumatic ischemia of muscle, sequela: Secondary | ICD-10-CM | POA: Diagnosis not present

## 2019-04-03 DIAGNOSIS — E669 Obesity, unspecified: Secondary | ICD-10-CM | POA: Diagnosis present

## 2019-04-03 DIAGNOSIS — A419 Sepsis, unspecified organism: Secondary | ICD-10-CM | POA: Diagnosis present

## 2019-04-03 DIAGNOSIS — N179 Acute kidney failure, unspecified: Secondary | ICD-10-CM | POA: Diagnosis present

## 2019-04-03 DIAGNOSIS — T40411A Poisoning by fentanyl or fentanyl analogs, accidental (unintentional), initial encounter: Secondary | ICD-10-CM | POA: Diagnosis present

## 2019-04-03 DIAGNOSIS — E875 Hyperkalemia: Secondary | ICD-10-CM | POA: Diagnosis present

## 2019-04-03 DIAGNOSIS — G9340 Encephalopathy, unspecified: Secondary | ICD-10-CM | POA: Diagnosis not present

## 2019-04-03 DIAGNOSIS — E785 Hyperlipidemia, unspecified: Secondary | ICD-10-CM | POA: Diagnosis present

## 2019-04-03 DIAGNOSIS — Z20828 Contact with and (suspected) exposure to other viral communicable diseases: Secondary | ICD-10-CM | POA: Diagnosis present

## 2019-04-03 DIAGNOSIS — E876 Hypokalemia: Secondary | ICD-10-CM | POA: Diagnosis not present

## 2019-04-03 DIAGNOSIS — R4189 Other symptoms and signs involving cognitive functions and awareness: Secondary | ICD-10-CM | POA: Diagnosis not present

## 2019-04-03 DIAGNOSIS — G92 Toxic encephalopathy: Secondary | ICD-10-CM | POA: Diagnosis present

## 2019-04-03 DIAGNOSIS — R509 Fever, unspecified: Secondary | ICD-10-CM | POA: Diagnosis not present

## 2019-04-03 DIAGNOSIS — L03313 Cellulitis of chest wall: Secondary | ICD-10-CM | POA: Diagnosis present

## 2019-04-03 DIAGNOSIS — R600 Localized edema: Secondary | ICD-10-CM | POA: Diagnosis not present

## 2019-04-03 DIAGNOSIS — R778 Other specified abnormalities of plasma proteins: Secondary | ICD-10-CM | POA: Diagnosis present

## 2019-04-03 DIAGNOSIS — M7989 Other specified soft tissue disorders: Secondary | ICD-10-CM | POA: Diagnosis not present

## 2019-04-03 DIAGNOSIS — I82611 Acute embolism and thrombosis of superficial veins of right upper extremity: Secondary | ICD-10-CM | POA: Diagnosis not present

## 2019-04-03 LAB — CBC
HCT: 43.7 % (ref 39.0–52.0)
Hemoglobin: 14.3 g/dL (ref 13.0–17.0)
MCH: 29.4 pg (ref 26.0–34.0)
MCHC: 32.7 g/dL (ref 30.0–36.0)
MCV: 89.9 fL (ref 80.0–100.0)
Platelets: 309 10*3/uL (ref 150–400)
RBC: 4.86 MIL/uL (ref 4.22–5.81)
RDW: 13.7 % (ref 11.5–15.5)
WBC: 14.1 10*3/uL — ABNORMAL HIGH (ref 4.0–10.5)
nRBC: 0.3 % — ABNORMAL HIGH (ref 0.0–0.2)

## 2019-04-03 LAB — POCT I-STAT 7, (LYTES, BLD GAS, ICA,H+H)
Acid-base deficit: 1 mmol/L (ref 0.0–2.0)
Bicarbonate: 22.4 mmol/L (ref 20.0–28.0)
Calcium, Ion: 1.09 mmol/L — ABNORMAL LOW (ref 1.15–1.40)
HCT: 41 % (ref 39.0–52.0)
Hemoglobin: 13.9 g/dL (ref 13.0–17.0)
O2 Saturation: 99 %
Patient temperature: 98.6
Potassium: 6 mmol/L — ABNORMAL HIGH (ref 3.5–5.1)
Sodium: 135 mmol/L (ref 135–145)
TCO2: 23 mmol/L (ref 22–32)
pCO2 arterial: 33.9 mmHg (ref 32.0–48.0)
pH, Arterial: 7.428 (ref 7.350–7.450)
pO2, Arterial: 145 mmHg — ABNORMAL HIGH (ref 83.0–108.0)

## 2019-04-03 LAB — CSF CELL COUNT WITH DIFFERENTIAL
RBC Count, CSF: 0 /mm3
RBC Count, CSF: 0 /mm3
Tube #: 1
Tube #: 4
WBC, CSF: 2 /mm3 (ref 0–5)
WBC, CSF: 2 /mm3 (ref 0–5)

## 2019-04-03 LAB — BASIC METABOLIC PANEL
Anion gap: 11 (ref 5–15)
Anion gap: 12 (ref 5–15)
Anion gap: 9 (ref 5–15)
BUN: 25 mg/dL — ABNORMAL HIGH (ref 6–20)
BUN: 28 mg/dL — ABNORMAL HIGH (ref 6–20)
BUN: 25 mg/dL — ABNORMAL HIGH (ref 6–20)
CO2: 23 mmol/L (ref 22–32)
CO2: 21 mmol/L — ABNORMAL LOW (ref 22–32)
CO2: 22 mmol/L (ref 22–32)
Calcium: 7.7 mg/dL — ABNORMAL LOW (ref 8.9–10.3)
Calcium: 7.7 mg/dL — ABNORMAL LOW (ref 8.9–10.3)
Calcium: 8 mg/dL — ABNORMAL LOW (ref 8.9–10.3)
Chloride: 106 mmol/L (ref 98–111)
Chloride: 108 mmol/L (ref 98–111)
Chloride: 109 mmol/L (ref 98–111)
Creatinine, Ser: 2.09 mg/dL — ABNORMAL HIGH (ref 0.61–1.24)
Creatinine, Ser: 2.4 mg/dL — ABNORMAL HIGH (ref 0.61–1.24)
Creatinine, Ser: 1.95 mg/dL — ABNORMAL HIGH (ref 0.61–1.24)
GFR calc Af Amer: 43 mL/min — ABNORMAL LOW (ref >60)
GFR calc Af Amer: 37 mL/min — ABNORMAL LOW (ref >60)
GFR calc Af Amer: 47 mL/min — ABNORMAL LOW (ref >60)
GFR calc non Af Amer: 37 mL/min — ABNORMAL LOW (ref >60)
GFR calc non Af Amer: 32 mL/min — ABNORMAL LOW (ref >60)
GFR calc non Af Amer: 41 mL/min — ABNORMAL LOW (ref >60)
Glucose, Bld: 85 mg/dL (ref 70–99)
Glucose, Bld: 97 mg/dL (ref 70–99)
Glucose, Bld: 101 mg/dL — ABNORMAL HIGH (ref 70–99)
Potassium: 6.3 mmol/L (ref 3.5–5.1)
Potassium: 5.6 mmol/L — ABNORMAL HIGH (ref 3.5–5.1)
Potassium: 4.2 mmol/L (ref 3.5–5.1)
Sodium: 140 mmol/L (ref 135–145)
Sodium: 141 mmol/L (ref 135–145)
Sodium: 140 mmol/L (ref 135–145)

## 2019-04-03 LAB — NA AND K (SODIUM & POTASSIUM), RAND UR
Potassium Urine: 100 mmol/L
Sodium, Ur: 28 mmol/L

## 2019-04-03 LAB — URINALYSIS, COMPLETE (UACMP) WITH MICROSCOPIC
Bilirubin Urine: NEGATIVE
Glucose, UA: NEGATIVE mg/dL
Ketones, ur: NEGATIVE mg/dL
Leukocytes,Ua: NEGATIVE
Nitrite: NEGATIVE
Protein, ur: 30 mg/dL — AB
Specific Gravity, Urine: 1.015 (ref 1.005–1.030)
pH: 5 (ref 5.0–8.0)

## 2019-04-03 LAB — HEPATITIS PANEL, ACUTE
HCV Ab: NONREACTIVE
Hep A IgM: NONREACTIVE
Hep B C IgM: NONREACTIVE
Hepatitis B Surface Ag: NONREACTIVE

## 2019-04-03 LAB — GLUCOSE, CAPILLARY
Glucose-Capillary: 75 mg/dL (ref 70–99)
Glucose-Capillary: 112 mg/dL — ABNORMAL HIGH (ref 70–99)
Glucose-Capillary: 69 mg/dL — ABNORMAL LOW (ref 70–99)
Glucose-Capillary: 86 mg/dL (ref 70–99)
Glucose-Capillary: 66 mg/dL — ABNORMAL LOW (ref 70–99)
Glucose-Capillary: 79 mg/dL (ref 70–99)
Glucose-Capillary: 66 mg/dL — ABNORMAL LOW (ref 70–99)
Glucose-Capillary: 71 mg/dL (ref 70–99)

## 2019-04-03 LAB — LACTIC ACID, PLASMA
Lactic Acid, Venous: 4.1 mmol/L (ref 0.5–1.9)
Lactic Acid, Venous: 5.2 mmol/L (ref 0.5–1.9)

## 2019-04-03 LAB — PROCALCITONIN: Procalcitonin: 14.02 ng/mL

## 2019-04-03 LAB — HEPATIC FUNCTION PANEL
ALT: 149 U/L — ABNORMAL HIGH (ref 0–44)
AST: 276 U/L — ABNORMAL HIGH (ref 15–41)
Albumin: 3.1 g/dL — ABNORMAL LOW (ref 3.5–5.0)
Alkaline Phosphatase: 47 U/L (ref 38–126)
Bilirubin, Direct: 0.2 mg/dL (ref 0.0–0.2)
Indirect Bilirubin: 0.6 mg/dL (ref 0.3–0.9)
Total Bilirubin: 0.8 mg/dL (ref 0.3–1.2)
Total Protein: 5.3 g/dL — ABNORMAL LOW (ref 6.5–8.1)

## 2019-04-03 LAB — CK
Total CK: 27261 U/L — ABNORMAL HIGH (ref 49–397)
Total CK: 27887 U/L — ABNORMAL HIGH (ref 49–397)

## 2019-04-03 LAB — MRSA PCR SCREENING: MRSA by PCR: NEGATIVE

## 2019-04-03 LAB — HIV ANTIBODY (ROUTINE TESTING W REFLEX): HIV Screen 4th Generation wRfx: NONREACTIVE

## 2019-04-03 LAB — MAGNESIUM
Magnesium: 3.2 mg/dL — ABNORMAL HIGH (ref 1.7–2.4)
Magnesium: 1.9 mg/dL (ref 1.7–2.4)

## 2019-04-03 LAB — CBG MONITORING, ED: Glucose-Capillary: 74 mg/dL (ref 70–99)

## 2019-04-03 LAB — SALICYLATE LEVEL: Salicylate Lvl: <7 mg/dL (ref 2.8–30.0)

## 2019-04-03 LAB — PROTIME-INR
INR: 1.1 (ref 0.8–1.2)
Prothrombin Time: 14.5 seconds (ref 11.4–15.2)

## 2019-04-03 LAB — PHOSPHORUS
Phosphorus: 11 mg/dL — ABNORMAL HIGH (ref 2.5–4.6)
Phosphorus: 1.6 mg/dL — ABNORMAL LOW (ref 2.5–4.6)

## 2019-04-03 LAB — APTT: aPTT: 27 seconds (ref 24–36)

## 2019-04-03 LAB — POTASSIUM
Potassium: 4.5 mmol/L (ref 3.5–5.1)
Potassium: 4.3 mmol/L (ref 3.5–5.1)

## 2019-04-03 LAB — CORTISOL: Cortisol, Plasma: 47.8 ug/dL

## 2019-04-03 LAB — PROTEIN AND GLUCOSE, CSF
Glucose, CSF: 71 mg/dL — ABNORMAL HIGH (ref 40–70)
Total  Protein, CSF: 32 mg/dL (ref 15–45)

## 2019-04-03 LAB — TROPONIN I (HIGH SENSITIVITY): Troponin I (High Sensitivity): 4459 ng/L (ref <18)

## 2019-04-03 LAB — ACETAMINOPHEN LEVEL: Acetaminophen (Tylenol), Serum: <10 ug/mL — ABNORMAL LOW (ref 10–30)

## 2019-04-03 LAB — TRIGLYCERIDES: Triglycerides: 79 mg/dL (ref <150)

## 2019-04-03 LAB — AMMONIA: Ammonia: 44 umol/L — ABNORMAL HIGH (ref 9–35)

## 2019-04-03 MED ORDER — DEXMEDETOMIDINE HCL IN NACL 400 MCG/100ML IV SOLN
0.0000 ug/kg/h | INTRAVENOUS | Status: DC
  Administered 2019-04-03: 0.4 ug/kg/h via INTRAVENOUS

## 2019-04-03 MED ORDER — PANTOPRAZOLE SODIUM 40 MG IV SOLR
40.0000 mg | Freq: Every day | INTRAVENOUS | Status: DC
  Administered 2019-04-03 – 2019-04-07 (×5): 40 mg via INTRAVENOUS
  Filled 2019-04-03 (×5): qty 40

## 2019-04-03 MED ORDER — PROPOFOL 1000 MG/100ML IV EMUL
5.0000 ug/kg/min | INTRAVENOUS | Status: DC
  Administered 2019-04-03: 70 ug/kg/min via INTRAVENOUS
  Administered 2019-04-03: 60 ug/kg/min via INTRAVENOUS
  Administered 2019-04-03: 50 ug/kg/min via INTRAVENOUS
  Administered 2019-04-03: 60 ug/kg/min via INTRAVENOUS
  Administered 2019-04-03: 70 ug/kg/min via INTRAVENOUS
  Administered 2019-04-03: 50 ug/kg/min via INTRAVENOUS
  Administered 2019-04-03: 45 ug/kg/min via INTRAVENOUS
  Administered 2019-04-04: 80 ug/kg/min via INTRAVENOUS
  Administered 2019-04-04 (×2): 70 ug/kg/min via INTRAVENOUS
  Filled 2019-04-03 (×4): qty 100
  Filled 2019-04-03: qty 200
  Filled 2019-04-03 (×3): qty 100
  Filled 2019-04-03: qty 200

## 2019-04-03 MED ORDER — CHLORHEXIDINE GLUCONATE CLOTH 2 % EX PADS
6.0000 | MEDICATED_PAD | Freq: Every day | CUTANEOUS | Status: DC
  Administered 2019-04-03 – 2019-04-09 (×5): 6 via TOPICAL

## 2019-04-03 MED ORDER — SODIUM CHLORIDE 0.9 % IV SOLN
INTRAVENOUS | Status: DC
  Administered 2019-04-03 (×6): via INTRAVENOUS

## 2019-04-03 MED ORDER — HEPARIN SODIUM (PORCINE) 5000 UNIT/ML IJ SOLN
5000.0000 [IU] | Freq: Three times a day (TID) | INTRAMUSCULAR | Status: DC
  Administered 2019-04-03 – 2019-04-09 (×17): 5000 [IU] via SUBCUTANEOUS
  Filled 2019-04-03 (×15): qty 1

## 2019-04-03 MED ORDER — CHLORHEXIDINE GLUCONATE 0.12% ORAL RINSE (MEDLINE KIT)
15.0000 mL | Freq: Two times a day (BID) | OROMUCOSAL | Status: DC
  Administered 2019-04-03 – 2019-04-04 (×4): 15 mL via OROMUCOSAL

## 2019-04-03 MED ORDER — SODIUM CHLORIDE 0.9 % IV SOLN
2.0000 g | INTRAVENOUS | Status: DC
  Administered 2019-04-04 – 2019-04-05 (×2): 2 g via INTRAVENOUS
  Filled 2019-04-03 (×2): qty 20

## 2019-04-03 MED ORDER — SODIUM ZIRCONIUM CYCLOSILICATE 5 G PO PACK
5.0000 g | PACK | Freq: Every day | ORAL | Status: DC
  Administered 2019-04-03: 5 g via ORAL
  Filled 2019-04-03 (×2): qty 1

## 2019-04-03 MED ORDER — BISACODYL 10 MG RE SUPP: 10.0000 mg | Freq: Every day | RECTAL | Status: DC | PRN

## 2019-04-03 MED ORDER — SODIUM ZIRCONIUM CYCLOSILICATE 5 G PO PACK
5.0000 g | PACK | Freq: Three times a day (TID) | ORAL | Status: DC
  Administered 2019-04-03: 5 g via ORAL
  Filled 2019-04-03 (×2): qty 1

## 2019-04-03 MED ORDER — ROCURONIUM BROMIDE 10 MG/ML (PF) SYRINGE
PREFILLED_SYRINGE | INTRAVENOUS | Status: AC
  Filled 2019-04-03: qty 10

## 2019-04-03 MED ORDER — ORAL CARE MOUTH RINSE
15.0000 mL | OROMUCOSAL | Status: DC
  Administered 2019-04-03 – 2019-04-04 (×15): 15 mL via OROMUCOSAL

## 2019-04-03 MED ORDER — ETOMIDATE 2 MG/ML IV SOLN
INTRAVENOUS | Status: AC
  Administered 2019-04-03: 06:00:00 via INTRAVENOUS
  Filled 2019-04-03: qty 10

## 2019-04-03 MED ORDER — SODIUM CHLORIDE 0.9 % IV SOLN
INTRAVENOUS | Status: DC | PRN
  Administered 2019-04-03 – 2019-04-06 (×3): 250 mL via INTRAVENOUS

## 2019-04-03 MED ORDER — DEXTROSE IN LACTATED RINGERS 5 % IV SOLN
INTRAVENOUS | Status: DC
  Administered 2019-04-04 – 2019-04-05 (×3): via INTRAVENOUS

## 2019-04-03 MED ORDER — DEXTROSE 50 % IV SOLN
1.0000 | Freq: Once | INTRAVENOUS | Status: AC
  Administered 2019-04-03: 50 mL via INTRAVENOUS
  Filled 2019-04-03: qty 50

## 2019-04-03 MED ORDER — NOREPINEPHRINE 4 MG/250ML-% IV SOLN
0.0000 ug/min | INTRAVENOUS | Status: DC
  Administered 2019-04-03: 6 ug/min via INTRAVENOUS
  Filled 2019-04-03: qty 250

## 2019-04-03 MED ORDER — DEXMEDETOMIDINE HCL IN NACL 400 MCG/100ML IV SOLN
0.0000 ug/kg/h | INTRAVENOUS | Status: DC
  Administered 2019-04-03: 0.4 ug/kg/h via INTRAVENOUS
  Filled 2019-04-03: qty 100

## 2019-04-03 MED ORDER — PRO-STAT SUGAR FREE PO LIQD
30.0000 mL | Freq: Two times a day (BID) | ORAL | Status: DC
  Administered 2019-04-03 (×2): 30 mL
  Filled 2019-04-03 (×3): qty 30

## 2019-04-03 MED ORDER — ROCURONIUM BROMIDE 50 MG/5ML IV SOLN
100.0000 mg | Freq: Once | INTRAVENOUS | Status: AC
  Administered 2019-04-03: 100 mg via INTRAVENOUS
  Filled 2019-04-03: qty 10

## 2019-04-03 MED ORDER — HEPARIN SODIUM (PORCINE) 5000 UNIT/ML IJ SOLN
5000.0000 [IU] | Freq: Three times a day (TID) | INTRAMUSCULAR | Status: DC
  Administered 2019-04-03: 5000 [IU] via SUBCUTANEOUS
  Filled 2019-04-03: qty 1

## 2019-04-03 MED ORDER — LACTATED RINGERS IV SOLN
INTRAVENOUS | Status: DC
  Administered 2019-04-03 – 2019-04-06 (×6): via INTRAVENOUS

## 2019-04-03 MED ORDER — VANCOMYCIN VARIABLE DOSE PER UNSTABLE RENAL FUNCTION (PHARMACIST DOSING): Status: DC

## 2019-04-03 MED ORDER — INSULIN ASPART 100 UNIT/ML IV SOLN
5.0000 [IU] | Freq: Once | INTRAVENOUS | Status: AC
  Administered 2019-04-03: 5 [IU] via INTRAVENOUS

## 2019-04-03 MED ORDER — ALBUMIN HUMAN 5 % IV SOLN
25.0000 g | Freq: Once | INTRAVENOUS | Status: AC
  Administered 2019-04-03: 25 g via INTRAVENOUS
  Filled 2019-04-03: qty 250

## 2019-04-03 MED ORDER — DOCUSATE SODIUM 50 MG/5ML PO LIQD
100.0000 mg | Freq: Two times a day (BID) | ORAL | Status: DC | PRN
  Filled 2019-04-03: qty 10

## 2019-04-03 MED ORDER — SODIUM CHLORIDE 0.9 % IV SOLN
1.0000 g | INTRAVENOUS | Status: DC
  Filled 2019-04-03: qty 10

## 2019-04-03 MED ORDER — SODIUM CHLORIDE 0.9 % IV SOLN
2.0000 g | Freq: Two times a day (BID) | INTRAVENOUS | Status: DC
  Administered 2019-04-03: 2 g via INTRAVENOUS
  Filled 2019-04-03: qty 20

## 2019-04-03 MED ORDER — VITAL HIGH PROTEIN PO LIQD
1000.0000 mL | ORAL | Status: DC
  Administered 2019-04-03: 1000 mL

## 2019-04-03 NOTE — Progress Notes (Signed)
Subjective: No significant changes. Sedated with propofol.   Exam: Vitals:   04/03/19 0700 04/03/19 0715  BP: 115/73 110/77  Pulse: (!) 111 (!) 109  Resp: 20 20  Temp: (!) 102 F (38.9 C) (!) 101.8 F (38.8 C)  SpO2: 98% 98%   Gen: In bed, NAD Resp: non-labored breathing, no acute distress Abd: soft, nt  Neuro: MS: comatose, doe snto open eyes or follow commands.  MO:LMBEM, absent corneals, no response to doll's eye maneuver.  Motor: no movement to nox stim Sensory:as above.  DTR:2+ and symmetric  Pertinent Labs: Ammonia 44 CK 27,261 Procalcitonin 14.02 AST 276, ALT 149 UDS - benzodiazepines  Impression: 43 yo M with coma of unclear etiology. My suspicion is that this is likely drug overdose. With the description of "pills from [place]" this would make fentanyl a likely culprit and this does not reliably show up in the urine drug screen. With fever and coma, he does need an LP to rule out CNS infection. I do have concern as well for hypoxic brain injury, and therefore need to repeat CT prior to LP to assure no mass effect. He has already received vancomycin and ceftriaxone has been ordered.   Recommendations: 1)urine fentanyl 2) increase ceftriaxone to meningitic dosing pending LP 3) STAT CT head 4) LP after CT if no signs of anoxic injury 5) will follow.    This patient is critically ill and at significant risk of neurological worsening, death and care requires constant monitoring of vital signs, hemodynamics,respiratory and cardiac monitoring, neurological assessment, discussion with family, other specialists and medical decision making of high complexity. I spent 40 minutes of neurocritical care time  in the care of  this patient. This was time spent independent of any time provided by nurse practitioner or PA.  Roland Rack, MD [facility] [phone]  If 7pm- 7am, please page neurology on call as listed in [place]. 04/03/2019  8:00 AM

## 2019-04-03 NOTE — Progress Notes (Signed)
  PHARMACY - PHYSICIAN COMMUNICATION CRITICAL VALUE ALERT - BLOOD CULTURE IDENTIFICATION (BCID)  Called by lab with BCx 1/4 GPC  Name of physician (or Provider) Contacted: None   Changes to prescribed antibiotics required: None  - already on Vancomycin   Bonnita Nasuti Pharm.D. CPP, BCPS Clinical Pharmacist [phone] 04/03/2019 10:48 PM

## 2019-04-03 NOTE — Progress Notes (Signed)
Pt. with 1251m UOP upon I&O cath. Order to insert foley cath per Dr. STamala Julian

## 2019-04-03 NOTE — Progress Notes (Signed)
eLink Physician-Brief Progress Note Patient Name: Brandon Thomas DOB: 01-Apr-1976 MRN: [id number]   Date of Service  04/03/2019  HPI/Events of Note  Pt admitted to the hospital via the ED with acute respiratory failure, altered mental status due to presumed polysubstance abuse/OD.  eICU Interventions  New Patient Evaluation completed.        Kerry Kass Gerry [place] 04/03/2019, 2:37 AM

## 2019-04-03 NOTE — Progress Notes (Signed)
Dear Doctor:  This patient has been identified as a candidate for PICC for the following reason (s): IV therapy over 48 hours, poor veins/poor circulatory system (CHF, COPD, emphysema, diabetes, steroid use, IV drug abuse, etc.) and incompatible drugs (aminophyllin, TPN, heparin, given with an antibiotic) If you agree, please write an order for the indicated device.   Thank you for supporting the early vascular access assessment program.

## 2019-04-03 NOTE — Progress Notes (Signed)
[facility] Progress Note Patient Name: HIPOLITO MARTINEZLOPEZ DOB: 1975-06-09 MRN: [id number]   Date of Service  04/03/2019  HPI/Events of Note  Low normal blood sugar  eICU Interventions  IV infusions changed to  LR @ 150 ml/ hour + D 5 LR @ 50 ml/ hour.        Kerry Kass Ogan 04/03/2019, 11:52 PM

## 2019-04-03 NOTE — H&P (Signed)
NAME:  Brandon Thomas, MRN:  [id number], DOB:  05-16-1975, LOS: 0 ADMISSION DATE:  04/02/2019, CONSULTATION DATE:  04/03/2019 REFERRING MD:  Dr. Dayna Barker, CHIEF COMPLAINT:  AMS/ suspected OD  Brief History   43 year old male with history of polysubstance abuse found unresponsive at home by wife, LSW 8 hrs earlier.  Suspected drug overdose.  Developed possible seizure like activity with EMS.  Required intubation in ER but since waking up following intermittent commands.  Found with suspected aspiration, AKI, rhabdo, hyperkalemia, transaminitis, and hypotension.  PCCM called for admit.    History of present illness   HPI obtained from medical chart review as patient is intubated and sedated on mechanical ventilation and per wife, Brandon Thomas at bedside.   43 year old male with history of polysubstance abuse presenting to ER after being found unresponsive.   Wife states patient long history of drug abuse for over 10 years.  Most recently out of rehab in September.  Wife does not think he has been on suboxone since rehab.  She reports that he has been acting strange over the last week and picking at his skin, with missing money, and suspicious contacts on his phone concerning for relapse.  In the past, patient has taken whatever he can get, including hx of IVDA.  Additionally, has taken [place] steroids [place], last injected one week ago seen by wife in which she states he takes for his hypogonadism instead of going to doctor.  No concerns from wife over possible SI attempt.  She last saw him around 1130 in his normal state of health.  She was unable to reach him by phone, so she came home and found him face down on the floor making some noises but otherwise unresponsiveby his wife around 1900 unresponsive and blue. Wife started CPR until her neighbor arrived who did not continue CPR.  Was given Narcan by Fire without response.  EMS found patient spontaneously breathing who then developed  possible decerebrate posturing and seizure like which was treated with versed.   In Er, patient arrived unresponsive requiring intubation for airway protection, Tmax in ER 101.2, tachycardic, and then developed hypotension after intubation and propofol which has since been changed to versed and fentanyl gtts with some improvement in SBP with fluid boluses.  [name] following intermittent commands and requiring restraints.  Neurology was consulted, loaded with keppra and currently on EEG. Labs noted for K 6.6, sCr 2.55, glucose 66, AST/ ALT 192/ 147, WBC 22.5, neg ETOH, UDS + benzo, Lactic acid 8.8, CK 9495, normal coags, UA with >500 glucose, large Hgb and protein 100.  ABG showing 7.257/ 50.4/ 106/ 22.4.  He is currently on his third liter of IVF with borderline hypotension.  He was treated with stabillizing measures for hyperkalemia, without EKG changes.  Empirically started on vancomycin and zosyn.  PCCM called for admit.   Past Medical History  Polysubstance abuse, HLD, hypogonadism, hx of elevated LFTs  Significant Hospital Events   11/26 Admit  Consults:  Neurology  Procedures:  11/25 ETT >>  11/25 Foley   Significant Diagnostic Tests:  11/25 pelvis xr >> neg  11/25 CT head/ cervical  >> 1. No acute intracranial abnormality. 2. Mild right frontal and supraorbital scalp swelling without subjacent calvarial fracture. 3. Motion artifact degrades of cervical spine imaging quality. May limit detection of subtle, nondisplaced fractures. No definite acute cervical spine fracture or traumatic listhesis. 4. Minimal cervical spondylitic changes  11/25  CT chest >> 1. Extensive areas of consolidation and volume loss posteriorly within the lungs with some air bronchograms, findings may reflect marked hypoventilatory changes in the setting of overdose though some underlying infection and/or sequela of aspiration may be present as well particularly given the airways thickening  and secretions below the endotracheal balloon. 2. Endotracheal tube terminates 3.2 cm from the carina. 3. Transesophageal tube tip terminates within the gastric lumen with the side port distal to the GE junction.  Micro Data:  11/26 SARS CoV2 >> neg 11/26 BC x 2 >> 11/26 trach asp >>  Antimicrobials:  11/26 vanc >> 11/26 zosyn  11/26 ceftriaxone >>  Interim history/subjective:  Currently on 3rd liter of fluid, on fentanyl 50 mcg/hr and versed 9m/ hr   Objective   Blood pressure (!) 83/52, pulse (!) 117, temperature (!) 101.3 F (38.5 C), resp. rate 13, height 5' 10"  (1.778 m), weight 104.3 kg, SpO2 100 %.    Vent Mode: PRVC FiO2 (%):  [100 %] 100 % Set Rate:  [16 bmp-20 bmp] 20 bmp Vt Set:  [580 mL] 580 mL PEEP:  [5 cmH20] 5 cmH20 Plateau Pressure:  [18 cmH20] 18 cmH20  No intake or output data in the 24 hours ending 04/03/19 0119 Filed Weights   04/02/19 2110  Weight: 104.3 kg   Examination: General:  Well nourished adult male restless on MV HEENT: MM pink/moist, no obvious tongue lac, pupils 4/reactive, anicertic, 7.5 ETT at 23 at lip, OGT with bilious drainage Neuro:  Will wake up, look around, mostly follows commands/ MAE, remains restless  CV: RR, no murmur PULM:  Non labored on MV, coarse, minimal secretions GI: soft, bs active, ND, foley with good UOP Extremities: cool/dry, no LE edema Skin: area to left chest and right arm as below  Left chest    RUE:  circumference of forearm measuring 31 cm, borders marked, has palpable pulse and wiggles fingers on command in right hand   Resolved Hospital Problem list    Assessment & Plan:   Acute respiratory insufficiency Probable aspiration pneumonia P:  Full MV support, PRVC 8 cc/kg ABG now CXR in am VAP bundle Daily SBT/ WUA   Hypotension - likely multifactorial sepsis +/- sedation P:  Tele monitoring Goal MAP >65 On 3 rd liter bolus  May need to consider pressors Checking cortisol  Trend  lactate Minimize sedation as able    Sepsis/ SIRS P:  Follow culture data, send trach asp Continue vanc and ceftriaxone Check PCT  Trend WBC / fever curve  Checking HIV/ hep C given unclear IVDA   R/o seizure P:  Per neurology EEG in place keppra per neurology  Seizure precautions   Acute encephalopathy most likely related to drug overdose  - CTH/ cervical as above, no acute changes P:  Check ammonia/ tylenol/ salicylate levels for completeness  EKG in am  Ongoing neuro exams  PAD protocol with fentanyl gtt and precedex, prn versed for RASS goal 0/-1 DWA   AKI Rhabdomylosis  Hyperkalemia  AGMA/ lactic acidosis  P:  Finishing 3 rd liter bolus then NS at 250 ml/hr  Repeat CK level at 0500 Repeat BMP now after temporizing measures for hyperkalemia May consider bicarb gtt  Strict I/Os   R forearm erythema - possible pressure area (was found face down) vs ? Cellulitis from ?IVDA P:  Monitor for compartment syndrome with frequent neurovascular checks, ortho consult not warranted at this time   Transaminitis  - hx of, wife denies  regular ETOH abuse - normal coags P:  Trend LFTs   Best practice:  Diet: NPO Pain/Anxiety/Delirium protocol (if indicated): Fentanyl, precedex, prn versed VAP protocol (if indicated): yes DVT prophylaxis: SCDs, heparin sq GI prophylaxis: PPI Glucose control: CBG q 4 Mobility: BR Code Status: Full Family Communication: Wife, Brandon Thomas at bedside [phone], updated  Disposition: ICU  Labs   CBC: Recent Labs  Lab 04/02/19 2109 04/02/19 2137 04/02/19 2205  WBC 22.5*  --   --   HGB 16.9 17.3* 16.0  HCT 54.9* 51.0 47.0  MCV 95.8  --   --   PLT 488*  --   --     Basic Metabolic Panel: Recent Labs  Lab 04/02/19 2109 04/02/19 2137 04/02/19 2205  NA 139 140 137  K 6.6* 5.6* 6.4*  CL 99 106  --   CO2 18*  --   --   GLUCOSE 66* 68*  --   BUN 21* 25*  --   CREATININE 2.55* 2.10*  --   CALCIUM 8.9  --   --     GFR: Estimated Creatinine Clearance: 54.3 mL/min (A) (by C-G formula based on SCr of 2.1 mg/dL (H)). Recent Labs  Lab 04/02/19 2109  WBC 22.5*  LATICACIDVEN 8.8*    Liver Function Tests: Recent Labs  Lab 04/02/19 2109  AST 192*  ALT 147*  ALKPHOS 100  BILITOT 0.6  PROT 7.9  ALBUMIN 4.6   No results for input(s): LIPASE, AMYLASE in the last 168 hours. No results for input(s): AMMONIA in the last 168 hours.  ABG    Component Value Date/Time   PHART 7.257 (L) 04/02/2019 2205   PCO2ART 50.4 (H) 04/02/2019 2205   PO2ART 106.0 04/02/2019 2205   HCO3 22.4 04/02/2019 2205   TCO2 24 04/02/2019 2205   ACIDBASEDEF 5.0 (H) 04/02/2019 2205   O2SAT 97.0 04/02/2019 2205     Coagulation Profile: Recent Labs  Lab 04/02/19 2201  INR 1.1    Cardiac Enzymes: Recent Labs  Lab 04/02/19 2109  CKTOTAL 9,495*    HbA1C: No results found for: HGBA1C  CBG: Recent Labs  Lab 04/03/19 0044  GLUCAP 74    Review of Systems:   Unable   Past Medical History  He,  has a past medical history of Depression, Elevated LFTs (08/20/2013), and Hyperlipidemia (08/20/2013).   Surgical History    Past Surgical History:  Procedure Laterality Date   WISDOM TOOTH EXTRACTION       Social History   reports that he has never smoked. His smokeless tobacco use includes chew. He reports current alcohol use of about 1.0 standard drinks of alcohol per week. He reports that he does not use drugs.   Family History   His family history is not on file. He was adopted.   Allergies Allergies  Allergen Reactions   Atorvastatin     RUQ pain   Crestor [Rosuvastatin]     Elevated LFTs     Home Medications  Prior to Admission medications   Medication Sig Start Date End Date Taking? Authorizing Provider  diltiazem 2 % GEL Apply 1 application topically 5 (five) times daily. 08/10/15   Irene Shipper, MD  Multiple Vitamin (MULTIVITAMIN) tablet Take 1 tablet by mouth daily.    [provider]  Na Sulfate-K Sulfate-Mg Sulf SOLN Take 1 kit by mouth once. 08/10/15   Irene Shipper, MD  NEEDLE, DISP, 22 G 22G X 1-1/2" MISC Inject into skin every 14 days.  09/10/15   Marcial Pacas, DO  Omega-3 Fatty Acids (FISH OIL PO) Take by mouth 2 (two) times daily.    [provider]  pravastatin (PRAVACHOL) 20 MG tablet Take 1 tablet (20 mg total) by mouth daily. 06/29/15   Marcial Pacas, DO  Syringe, Disposable, 3 ML MISC Inject into skin every 14 days. 09/10/15   Marcial Pacas, DO  testosterone cypionate (DEPOTESTOSTERONE CYPIONATE) 200 MG/ML injection Inject 1 mL (200 mg total) into the muscle every 14 (fourteen) days. 09/10/15   Marcial Pacas, DO     Critical care time: 60 mins      Kennieth Rad, MSN, AGACNP-BC [facility] 04/03/2019, 2:00 AM

## 2019-04-03 NOTE — Progress Notes (Signed)
LTM maint complete - no skin breakdown under:  C4,T6,P3,O1

## 2019-04-03 NOTE — ED Notes (Signed)
Patient has had 2 LNS

## 2019-04-03 NOTE — Progress Notes (Signed)
LTM EEG initiated. Dr. Lorraine Lax and Dr. Hortense Ramal notified. Staff and educated regarding pt event button.

## 2019-04-03 NOTE — Consult Note (Signed)
Requesting Physician: Dr. Langston Masker   Chief Complaint: Found unresponsive, questionable seizure activity  History obtained from: Patient and Chart    HPI:                                                                                                                                       Brandon Thomas is a 43 y.o. male with past medical history of depression, hyperlipidemia, poly-substance abuse presents to the emergency department after being found down unresponsive at home by his wife.  Last known well was around noon.  Wife walked in and found the patient between the bed and the bathroom in a prone position.  He was making gurgling sounds with his mouth and she called EMS.  She started CPR.  When EMS arrived, noted to have a pulse.  Narcan was administered without any response.  Due to decerebrate posturing and concern for seizure patient was treated with Versed.  On arrival to [facility], ER patient was intubated for airway protection.  Due to concern that he may be in nonconvulsive status epilepticus neurology was consulted as patient was poorly responsive prior to intubation.  His lab work also suggestive for possible prolonged seizure activity with elevated lactic acid, elevated CK of 9495.  UDS was positive for benzos.  On assessment, patient was intubated but moving spontaneously and gagging at the tube.  Stat EEG was ordered and patient received 1 g of Keppra.    Past Medical History:  Diagnosis Date  . Depression    Well treated without meds currently  . Elevated LFTs 08/20/2013  . Hyperlipidemia 08/20/2013    Past Surgical History:  Procedure Laterality Date  . WISDOM TOOTH EXTRACTION      Family History  Adopted: Yes   Social History:  reports that he has never smoked. His smokeless tobacco use includes chew. He reports current alcohol use of about 1.0 standard drinks of alcohol per week. He reports that he does not use drugs.  Allergies:  Allergies  Allergen  Reactions  . Atorvastatin     RUQ pain  . Crestor [Rosuvastatin]     Elevated LFTs    Medications:                                                                                                                        I reviewed home medications   ROS:  Unable to obtain due to patient's mental status   Examination:                                                                                                      General: Appears well-developed and well-nourished.  Psych: Affect appropriate to situation Eyes: No scleral injection HENT: No OP obstrucion Head: Normocephalic.  Cardiovascular: Normal rate and regular rhythm.  Respiratory: Breathing comfortably with assistance of ventilator GI: Soft.  No distension. There is no tenderness.  Skin: WDI    Neurological Examination Mental Status: Patient not following any commands, grimaces to pain Cranial Nerves: II: Pupils are equal and reactive III,IV,VI: Eyes are moving spontaneously, no forced gaze deviation does not track examiner V,VII: corneal reflex: Present VIII: patient does not respond to verbal stimuli IX,X: gag reflex present  Motor: Withdraws in all 4 extremities, spontaneous movement noted in both upper extremities Sensory: Does not respond to noxious stimuli in any extremity. Deep Tendon Reflexes:  Absent throughout. Plantars: Flexor Cerebellar: Unable to perform   Lab Results: Basic Metabolic Panel: Recent Labs  Lab 04/02/19 July 26, 2107 04/02/19 Jul 26, 2135 04/02/19 2205  NA 139 140 137  K 6.6* 5.6* 6.4*  CL 99 106  --   CO2 18*  --   --   GLUCOSE 66* 68*  --   BUN 21* 25*  --   CREATININE 2.55* 2.10*  --   CALCIUM 8.9  --   --     CBC: Recent Labs  Lab 04/02/19 July 26, 2107 04/02/19 07/26/35 04/02/19 2205  WBC 22.5*  --   --   HGB 16.9 17.3* 16.0  HCT 54.9* 51.0 47.0  MCV 95.8  --    --   PLT 488*  --   --     Coagulation Studies: Recent Labs    04/02/19 07-25-2199  LABPROT 13.6  INR 1.1    Imaging: Ct Head Wo Contrast  Result Date: 04/02/2019 CLINICAL DATA:  Overdose EXAM: CT HEAD WITHOUT CONTRAST CT CERVICAL SPINE WITHOUT CONTRAST TECHNIQUE: Multidetector CT imaging of the head and cervical spine was performed following the standard protocol without intravenous contrast. Multiplanar CT image reconstructions of the cervical spine were also generated. COMPARISON:  None. FINDINGS: CT HEAD FINDINGS Brain: Preservation of the gray-white differentiation. No convincing features global cerebral edema or diffuse anoxic injury. No evidence of acute infarction, hemorrhage, hydrocephalus, extra-axial collection or mass lesion/mass effect. Vascular: Atherosclerotic calcification seen in the carotid siphons. No unexpected hyperdense vessel. Skull: Mild right frontal and supraorbital scalp swelling without subjacent calvarial fracture. No suspicious osseous lesions. Sinuses/Orbits: Minimal mural thickening in the left maxillary sinus. Remaining paranasal sinuses and mastoids are predominantly clear. Included orbital structures are unremarkable. Other: Transesophageal and endotracheal tubes in place. CT CERVICAL SPINE FINDINGS Alignment: Imaging quality of the cervical spine degraded by motion artifact. Preservation of the normal cervical lordosis without gross traumatic listhesis. No abnormal facet widening. Normal alignment of the craniocervical and atlantoaxial articulations. Skull base and vertebrae: Motion artifact may limit detection of subtle, nondisplaced fractures. No definite acute fracture or suspicious osseous lesion. Soft tissues  and spinal canal: No pre or paravertebral fluid or swelling. No visible canal hematoma. Disc levels: Minimal cervical spondylitic changes maximally at C5-6 and C6-7 with small posterior disc osteophyte complexes resulting in mild canal stenosis and uncinate  spurring with hypertrophic facet changes at C6-7 resulting in mild to moderate bilateral foraminal narrowing. No severe canal or foraminal stenosis in the cervical spine Upper chest: Atelectatic changes noted dependently in the lungs. Other: None IMPRESSION: 1. No acute intracranial abnormality. 2. Mild right frontal and supraorbital scalp swelling without subjacent calvarial fracture. 3. Motion artifact degrades of cervical spine imaging quality. May limit detection of subtle, nondisplaced fractures. No definite acute cervical spine fracture or traumatic listhesis. 4. Minimal cervical spondylitic changes, as above. Electronically Signed   By: Lovena Le M.D.   On: 04/02/2019 22:42   Ct Chest Wo Contrast  Result Date: 04/02/2019 CLINICAL DATA:  Overdose EXAM: CT CHEST WITHOUT CONTRAST TECHNIQUE: Multidetector CT imaging of the chest was performed following the standard protocol without IV contrast. COMPARISON:  Radiograph 04/02/2019 FINDINGS: Cardiovascular: Normal heart size. No pericardial effusion. Normal central pulmonary arterial caliber. Normal caliber of the thoracic aorta. Normal 3 vessel branching of the aortic arch. Mediastinum/Nodes: Endotracheal tube terminates 3.2 cm from the carina. A transesophageal tube tip terminates within the gastric lumen with the side port distal to the GE junction. No acute trachea or esophageal abnormality. No mediastinal or axillary adenopathy. Hilar lymph nodes are difficult to assess in the absence of contrast media. No mediastinal hemorrhage or gas. Thyroid gland and thoracic inlet are unremarkable. Lungs/Pleura: Extensive areas of consolidation and volume loss seen posteriorly within the lungs with some air bronchograms. Some secretions are noted distal to the endotracheal balloon. No pneumothorax. No convincing effusion. No suspicious nodules or masses. Upper Abdomen: No acute abnormalities present in the visualized portions of the upper abdomen. Musculoskeletal:  No chest wall mass or suspicious bone lesions identified. IMPRESSION: 1. Extensive areas of consolidation and volume loss posteriorly within the lungs with some air bronchograms, findings may reflect marked hypoventilatory changes in the setting of overdose though some underlying infection and/or sequela of aspiration may be present as well particularly given the airways thickening and secretions below the endotracheal balloon. 2. Endotracheal tube terminates 3.2 cm from the carina. 3. Transesophageal tube tip terminates within the gastric lumen with the side port distal to the GE junction. Electronically Signed   By: Lovena Le M.D.   On: 04/02/2019 22:46   Ct Cervical Spine Wo Contrast  Result Date: 04/02/2019 CLINICAL DATA:  Overdose EXAM: CT HEAD WITHOUT CONTRAST CT CERVICAL SPINE WITHOUT CONTRAST TECHNIQUE: Multidetector CT imaging of the head and cervical spine was performed following the standard protocol without intravenous contrast. Multiplanar CT image reconstructions of the cervical spine were also generated. COMPARISON:  None. FINDINGS: CT HEAD FINDINGS Brain: Preservation of the gray-white differentiation. No convincing features global cerebral edema or diffuse anoxic injury. No evidence of acute infarction, hemorrhage, hydrocephalus, extra-axial collection or mass lesion/mass effect. Vascular: Atherosclerotic calcification seen in the carotid siphons. No unexpected hyperdense vessel. Skull: Mild right frontal and supraorbital scalp swelling without subjacent calvarial fracture. No suspicious osseous lesions. Sinuses/Orbits: Minimal mural thickening in the left maxillary sinus. Remaining paranasal sinuses and mastoids are predominantly clear. Included orbital structures are unremarkable. Other: Transesophageal and endotracheal tubes in place. CT CERVICAL SPINE FINDINGS Alignment: Imaging quality of the cervical spine degraded by motion artifact. Preservation of the normal cervical lordosis  without gross traumatic listhesis. No abnormal facet widening.  Normal alignment of the craniocervical and atlantoaxial articulations. Skull base and vertebrae: Motion artifact may limit detection of subtle, nondisplaced fractures. No definite acute fracture or suspicious osseous lesion. Soft tissues and spinal canal: No pre or paravertebral fluid or swelling. No visible canal hematoma. Disc levels: Minimal cervical spondylitic changes maximally at C5-6 and C6-7 with small posterior disc osteophyte complexes resulting in mild canal stenosis and uncinate spurring with hypertrophic facet changes at C6-7 resulting in mild to moderate bilateral foraminal narrowing. No severe canal or foraminal stenosis in the cervical spine Upper chest: Atelectatic changes noted dependently in the lungs. Other: None IMPRESSION: 1. No acute intracranial abnormality. 2. Mild right frontal and supraorbital scalp swelling without subjacent calvarial fracture. 3. Motion artifact degrades of cervical spine imaging quality. May limit detection of subtle, nondisplaced fractures. No definite acute cervical spine fracture or traumatic listhesis. 4. Minimal cervical spondylitic changes, as above. Electronically Signed   By: Lovena Le M.D.   On: 04/02/2019 22:42   Dg Pelvis Portable  Result Date: 04/02/2019 CLINICAL DATA:  Found down, possible seizure. EXAM: PORTABLE PELVIS 1-2 VIEWS COMPARISON:  None. FINDINGS: The cortical margins of the bony pelvis are intact. No fracture. Pubic symphysis and sacroiliac joints are congruent. Both femoral heads are well-seated in the respective acetabula. Rectal tube/temperature probe projects centrally over the pelvis. IMPRESSION: Unremarkable radiograph of the pelvis. Electronically Signed   By: Keith Rake M.D.   On: 04/02/2019 21:38   Dg Chest Port 1 View  Result Date: 04/02/2019 CLINICAL DATA:  Verify endotracheal and OG tube placement. Found down. Possible seizure. EXAM: PORTABLE CHEST 1  VIEW COMPARISON:  None. FINDINGS: Endotracheal tube tip 4.6 cm from the carina at the thoracic inlet. Tip and side port of the enteric tube below the diaphragm in the stomach. Lung volumes are low. Upper normal heart size, likely accentuated by technique. Mild patchy bilateral perihilar opacities, left greater than right. No large pleural effusion or pneumothorax. No acute osseous abnormalities are seen. IMPRESSION: 1. Endotracheal tube tip 4.6 cm from the carina at the thoracic inlet. 2. Enteric tube tip and side port below the diaphragm in the stomach. 3. Low lung volumes with mild patchy bilateral perihilar opacities, left greater than right. Findings may be atelectasis, pneumonia, or aspiration. Electronically Signed   By: Keith Rake M.D.   On: 04/02/2019 21:39     I have reviewed the above imaging: Reviewed CT head no acute findings   ASSESSMENT AND PLAN   43 y.o. male with past medical history of depression, hyperlipidemia, poly-substance abuse presents to the emergency department after being found down unresponsive at home by his wife.  Acute metabolic encephalopathy Possible postictal state/status epilepticus Coma on presentation   Recommendations Stat EEG: No ongoing seizures, will continue monitoring for at least 12 hours MRI brain after EEG Continue Keppra 500 mg twice daily, may discontinue if EEG does not show any epileptiform discharges, as seizure may be provoked in the setting of substance abuse Seizure precautions Wean sedation as tolerated, consider switching from propofol to Precedex Management of metabolic and electrolyte derangements per [facility]   Neurology will continue to follow.  Kwali Wrinkle [facility] Pager Number [phone]

## 2019-04-03 NOTE — Procedures (Addendum)
Patient Name: Brandon Thomas  MRN: [id number]  Epilepsy Attending: Lora Havens  Referring Physician/Provider: Dr. Karena Addison Aroor Duration: 04/02/2019 2339 to 04/03/2019 2339  Patient history: 43 year old male with history of polysubstance abuse was found down with seizure-like activity.  EEG to evaluate for subclinical status epilepticus.  Level of alertness: Comatose/sedated  AEDs during EEG study: Versed, Keppra  Technical aspects: This EEG study was done with scalp electrodes positioned according to the 10-20 International system of electrode placement. Electrical activity was acquired at a sampling rate of 500Hz  and reviewed with a high frequency filter of 70Hz  and a low frequency filter of 1Hz . EEG data were recorded continuously and digitally stored.   Description: EEG showed continuous generalized 2 to 3 Hz delta slowing with overriding 15 to 18 Hz, 2-3 uV beta activity with irregular morphology distributed symmetrically and diffusely.  Hyperventilation and photic stimulation were not performed.  Abnormality  -Continuous slow, generalized -Excessive beta, generalized  IMPRESSION: This study is suggestive of severe diffuse encephalopathy consistent with patient's sedated state. No seizures or epileptiform discharges were seen throughout the recording.  Galia Rahm Barbra Sarks

## 2019-04-03 NOTE — Progress Notes (Signed)
[place] Progress Note Patient Name: PAPA PIERCEFIELD DOB: 11-21-1975 MRN: [id number]   Date of Service  04/03/2019  HPI/Events of Note  Pt with extreme agitation despite sedation.  eICU Interventions  Soft ankle restraints ordered in addition to wrist restraints for patient safety.        Kerry Kass Paul Torpey 04/03/2019, 6:29 AM

## 2019-04-03 NOTE — Progress Notes (Signed)
CRITICAL VALUE ALERT  Critical Value:  Troponin 4459  Date & Time Notied:  04/02/2017 0440  Provider Notified:  Dr. Lucile Shutters via Warren Lacy RN  Orders Received/Actions taken: awaiting new orders

## 2019-04-03 NOTE — Progress Notes (Signed)
Pt taken to/from CT scan via vent w/ no apparent complications.

## 2019-04-03 NOTE — Progress Notes (Signed)
Pharmacy Antibiotic Note  Brandon Thomas is a 43 y.o. male admitted on 04/02/2019 with sepsis.  Pharmacy has been consulted for vancomycin dosing.  Of note pt has AKI w/ baseline SCr ~1, now 2.55.  Plan: Vancomycin 2542m IV x1; monitor SCr +/- vanc levels prior to redosing.  Height: 5' 10"  (177.8 cm) Weight: 230 lb (104.3 kg) IBW/kg (Calculated) : 73  Temp (24hrs), Avg:100.5 F (38.1 C), Min:98.6 F (37 C), Max:101.3 F (38.5 C)  Recent Labs  Lab 04/02/19 2109 04/02/19 2137 04/03/19 0013  WBC 22.5*  --   --   CREATININE 2.55* 2.10*  --   LATICACIDVEN 8.8*  --  4.1*    Estimated Creatinine Clearance: 54.3 mL/min (A) (by C-G formula based on SCr of 2.1 mg/dL (H)).    Allergies  Allergen Reactions  . Atorvastatin     RUQ pain  . Crestor [Rosuvastatin]     Elevated LFTs    Thank you for allowing pharmacy to be a part of this patient's care.  VWynona Neat PharmD, BCPS  04/03/2019 1:45 AM

## 2019-04-03 NOTE — Consult Note (Signed)
Responded to page, met with pt's wife in consult rm A, provided emotional support. She declined prayer. Present for her initial briefing by doctor and, later, when checked back, she was bedside in [facility] room. Her mother and sister are with their 11-y-o daughter. Please page again if further need of chaplain services.   Rev. Eloise Levels Chaplain

## 2019-04-03 NOTE — Procedures (Signed)
Lumbar Puncture Procedure Note  Pre-operative Diagnosis: AMS, fever, r/o meningitis  Post-operative Diagnosis: AMS, fever, r/o meningitis  Indications: Diagnostic  Procedure Details   Consent: Informed consent was obtained. Risks of the procedure were discussed including: infection, bleeding, pain and headache.  The patient was positioned under sterile conditions. Betadine solution and sterile drapes were utilized. A spinal needle was inserted at the L3 - L4 interspace.  Spinal fluid was obtained and sent to the laboratory.  Findings 65m of clear spinal fluid was obtained.   Complications:  None; patient tolerated the procedure well.        Condition: stable

## 2019-04-03 NOTE — Progress Notes (Signed)
[facility] Progress Note Patient Name: Brandon Thomas DOB: 11-25-1975 MRN: [id number]   Date of Service  04/03/2019  HPI/Events of Note  Pt extremely agitated and almost self-extubated on a Precedex infusion.  eICU Interventions  Precedex discontinued, Propofol infusion started, Versed 3 mg iv x 1, Rocuronium 100 mg iv x 1, ETT position in trachea verified using CO@ monitor, Bite block ordered. Portable CXR to check ETT ordered.        Kerry Kass Ogan 04/03/2019, 6:02 AM

## 2019-04-04 ENCOUNTER — Inpatient Hospital Stay (HOSPITAL_COMMUNITY): Payer: BLUE CROSS/BLUE SHIELD

## 2019-04-04 DIAGNOSIS — J9600 Acute respiratory failure, unspecified whether with hypoxia or hypercapnia: Secondary | ICD-10-CM | POA: Diagnosis present

## 2019-04-04 DIAGNOSIS — N179 Acute kidney failure, unspecified: Secondary | ICD-10-CM | POA: Diagnosis present

## 2019-04-04 LAB — VANCOMYCIN, RANDOM: Vancomycin Rm: 7

## 2019-04-04 LAB — CBC WITH DIFFERENTIAL/PLATELET
Abs Immature Granulocytes: 0.08 10*3/uL — ABNORMAL HIGH (ref 0.00–0.07)
Basophils Absolute: 0.1 10*3/uL (ref 0.0–0.1)
Basophils Relative: 1 %
Eosinophils Absolute: 0.1 10*3/uL (ref 0.0–0.5)
Eosinophils Relative: 1 %
HCT: 41.4 % (ref 39.0–52.0)
Hemoglobin: 13.2 g/dL (ref 13.0–17.0)
Immature Granulocytes: 1 %
Lymphocytes Relative: 28 %
Lymphs Abs: 3.3 10*3/uL (ref 0.7–4.0)
MCH: 29.5 pg (ref 26.0–34.0)
MCHC: 31.9 g/dL (ref 30.0–36.0)
MCV: 92.4 fL (ref 80.0–100.0)
Monocytes Absolute: 1 10*3/uL (ref 0.1–1.0)
Monocytes Relative: 8 %
Neutro Abs: 7.5 10*3/uL (ref 1.7–7.7)
Neutrophils Relative %: 61 %
Platelets: 332 10*3/uL (ref 150–400)
RBC: 4.48 MIL/uL (ref 4.22–5.81)
RDW: 14.4 % (ref 11.5–15.5)
WBC: 12 10*3/uL — ABNORMAL HIGH (ref 4.0–10.5)
nRBC: 0 % (ref 0.0–0.2)

## 2019-04-04 LAB — GLUCOSE, CAPILLARY
Glucose-Capillary: 84 mg/dL (ref 70–99)
Glucose-Capillary: 98 mg/dL (ref 70–99)
Glucose-Capillary: 121 mg/dL — ABNORMAL HIGH (ref 70–99)
Glucose-Capillary: 145 mg/dL — ABNORMAL HIGH (ref 70–99)
Glucose-Capillary: 86 mg/dL (ref 70–99)

## 2019-04-04 LAB — POTASSIUM
Potassium: 3.9 mmol/L (ref 3.5–5.1)
Potassium: 4.1 mmol/L (ref 3.5–5.1)
Potassium: 4.3 mmol/L (ref 3.5–5.1)
Potassium: 4.4 mmol/L (ref 3.5–5.1)

## 2019-04-04 LAB — PHOSPHORUS
Phosphorus: 1.1 mg/dL — ABNORMAL LOW (ref 2.5–4.6)
Phosphorus: 3 mg/dL (ref 2.5–4.6)

## 2019-04-04 LAB — CULTURE, BLOOD (ROUTINE X 2): Special Requests: ADEQUATE

## 2019-04-04 LAB — HEPATIC FUNCTION PANEL
ALT: 224 U/L — ABNORMAL HIGH (ref 0–44)
AST: 409 U/L — ABNORMAL HIGH (ref 15–41)
Albumin: 3 g/dL — ABNORMAL LOW (ref 3.5–5.0)
Alkaline Phosphatase: 41 U/L (ref 38–126)
Bilirubin, Direct: 0.2 mg/dL (ref 0.0–0.2)
Indirect Bilirubin: 0.5 mg/dL (ref 0.3–0.9)
Total Bilirubin: 0.7 mg/dL (ref 0.3–1.2)
Total Protein: 5 g/dL — ABNORMAL LOW (ref 6.5–8.1)

## 2019-04-04 LAB — HCV RNA QUANT: HCV Quantitative: NOT DETECTED IU/mL (ref >50)

## 2019-04-04 LAB — LACTIC ACID, PLASMA: Lactic Acid, Venous: 1.1 mmol/L (ref 0.5–1.9)

## 2019-04-04 LAB — BASIC METABOLIC PANEL
Anion gap: 15 (ref 5–15)
BUN: 18 mg/dL (ref 6–20)
CO2: 19 mmol/L — ABNORMAL LOW (ref 22–32)
Calcium: 8.3 mg/dL — ABNORMAL LOW (ref 8.9–10.3)
Chloride: 111 mmol/L (ref 98–111)
Creatinine, Ser: 1.24 mg/dL (ref 0.61–1.24)
GFR calc Af Amer: >60 mL/min (ref >60)
GFR calc non Af Amer: >60 mL/min (ref >60)
Glucose, Bld: 79 mg/dL (ref 70–99)
Potassium: 3.6 mmol/L (ref 3.5–5.1)
Sodium: 145 mmol/L (ref 135–145)

## 2019-04-04 LAB — CK TOTAL AND CKMB (NOT AT ARMC)
CK, MB: 74.3 ng/mL — ABNORMAL HIGH (ref 0.5–5.0)
Relative Index: 0.3 (ref 0.0–2.5)
Total CK: 24818 U/L — ABNORMAL HIGH (ref 49–397)

## 2019-04-04 LAB — HEMOGLOBIN A1C
Hgb A1c MFr Bld: 5.1 % (ref 4.8–5.6)
Mean Plasma Glucose: 99.67 mg/dL

## 2019-04-04 LAB — MAGNESIUM
Magnesium: 2 mg/dL (ref 1.7–2.4)
Magnesium: 1.9 mg/dL (ref 1.7–2.4)

## 2019-04-04 MED ORDER — MIDAZOLAM HCL 2 MG/2ML IJ SOLN
2.0000 mg | Freq: Once | INTRAMUSCULAR | Status: AC
  Administered 2019-04-04: 2 mg via INTRAVENOUS

## 2019-04-04 MED ORDER — MIDAZOLAM HCL 2 MG/2ML IJ SOLN
INTRAMUSCULAR | Status: AC
  Filled 2019-04-04: qty 2

## 2019-04-04 MED ORDER — VANCOMYCIN HCL IN DEXTROSE 1-5 GM/200ML-% IV SOLN
1000.0000 mg | Freq: Three times a day (TID) | INTRAVENOUS | Status: DC
  Filled 2019-04-04: qty 200

## 2019-04-04 MED ORDER — VANCOMYCIN HCL 10 G IV SOLR
2000.0000 mg | Freq: Once | INTRAVENOUS | Status: AC
  Administered 2019-04-04: 2000 mg via INTRAVENOUS
  Filled 2019-04-04: qty 2000

## 2019-04-04 MED ORDER — ALBUTEROL SULFATE (2.5 MG/3ML) 0.083% IN NEBU
2.5000 mg | INHALATION_SOLUTION | Freq: Four times a day (QID) | RESPIRATORY_TRACT | Status: DC | PRN
  Administered 2019-04-04: 2.5 mg via RESPIRATORY_TRACT

## 2019-04-04 MED ORDER — POTASSIUM PHOSPHATES 15 MMOLE/5ML IV SOLN
20.0000 mmol | Freq: Once | INTRAVENOUS | Status: AC
  Administered 2019-04-04: 20 mmol via INTRAVENOUS
  Filled 2019-04-04: qty 6.67

## 2019-04-04 MED ORDER — ALBUTEROL SULFATE HFA 108 (90 BASE) MCG/ACT IN AERS: 8.0000 | INHALATION_SPRAY | RESPIRATORY_TRACT | Status: DC | PRN

## 2019-04-04 MED ORDER — ALBUTEROL SULFATE (2.5 MG/3ML) 0.083% IN NEBU
INHALATION_SOLUTION | RESPIRATORY_TRACT | Status: AC
  Filled 2019-04-04: qty 3

## 2019-04-04 MED ORDER — DEXMEDETOMIDINE HCL IN NACL 400 MCG/100ML IV SOLN
0.4000 ug/kg/h | INTRAVENOUS | Status: DC
  Administered 2019-04-04 (×2): 1.2 ug/kg/h via INTRAVENOUS
  Administered 2019-04-04: 0.4 ug/kg/h via INTRAVENOUS
  Administered 2019-04-04 – 2019-04-05 (×2): 0.8 ug/kg/h via INTRAVENOUS
  Filled 2019-04-04 (×5): qty 100

## 2019-04-04 NOTE — Progress Notes (Signed)
LTM EEG discontinued. Electrodes removed, forehead indentions noted, no skin breakdown. RN notified.

## 2019-04-04 NOTE — Procedures (Signed)
Patient Name: Brandon Thomas  MRN: [id number]  Epilepsy Attending: Lora Havens  Referring Physician/Provider: Dr. Karena Addison Aroor Duration: 04/03/2019 2339 to 04/04/2019 5956  Patient history: 43 year old male with history of polysubstance abuse was found down with seizure-like activity.  EEG to evaluate for subclinical status epilepticus.  Level of alertness: Comatose/sedated  AEDs during EEG study: Versed, Keppra  Technical aspects: This EEG study was done with scalp electrodes positioned according to the 10-20 International system of electrode placement. Electrical activity was acquired at a sampling rate of 500Hz  and reviewed with a high frequency filter of 70Hz  and a low frequency filter of 1Hz . EEG data were recorded continuously and digitally stored.   Description: EEG showed continuous generalized 2 to 3 Hz delta slowing with overriding 15 to 18 Hz, 2-3 uV beta activity with irregular morphology distributed symmetrically and diffusely.  Hyperventilation and photic stimulation were not performed.  Abnormality  -Continuous slow, generalized -Excessive beta, generalized  IMPRESSION: This study is suggestive of severe diffuse encephalopathy consistent with patient's sedated state. No seizures or epileptiform discharges were seen throughout the recording.  Nilo Fallin Barbra Sarks

## 2019-04-04 NOTE — Progress Notes (Signed)
MD called due to patient extremely agitated, fighting ventilator, pulling against restraints. Patient then desaturated into the 50s, suctioned by this RN and suctioned a mucus plug. Saturation immediately improved, but patient continued to fight ventilator and was not receiving his volumes. RT at the bedside. MD camera in via [place] and new orders given.

## 2019-04-04 NOTE — Progress Notes (Addendum)
Subjective: Awake, extubated this morning.   LP negative  He has significant anterograde amnesia, has asked his wife multiple times about where he is.   No sz on EEG  Exam: Vitals:   04/04/19 0824 04/04/19 1100  BP:    Pulse: (!) 121   Resp: (!) 34   Temp:  98.6 F (37 C)  SpO2: 99%    Gen: In bed, NAD Psych: appears mildly agitated Resp: non-labored breathing, no acute distress Abd: soft, nt  Neuro: MS: Awake, not oriented, gives kids ages as 67 and 5(they are 8 and 11) CN: PERRL, absent corneals, no response to doll's eye maneuver.  Motor: moves all extremities spontaneously against gravity.   Pertinent Labs: Ammonia 44 CK [id number]) -> [id number]) -> 32202(54/27)  UDS - benzodiazepines CSF   0 rbc   2 wbc  32 protein  71 glucose   Impression: 43 yo M with unresponsiveness of unclear etiology. 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 This could be further investigated with MRI brain to assess for DWI changes in the hippocampus.   I suspect CKemia is due to being down.   Recommendations: 1) urine fentanyl 2) Abx per IM, no need to   3) STAT CT head 4) LP after CT if no signs of anoxic injury 5) will follow.    This patient is critically ill and at significant risk of neurological worsening, death and care requires constant monitoring of vital signs, hemodynamics,respiratory and cardiac monitoring, neurological assessment, discussion with family, other specialists and medical decision making of high complexity. I spent 40 minutes of neurocritical care time  in the care of  this patient. This was time spent independent of any time provided by nurse practitioner or PA.  Roland Rack, MD [facility] [phone]  If 7pm- 7am, please page neurology on call as listed in  [place]. 04/04/2019  1:08 PM

## 2019-04-04 NOTE — Progress Notes (Signed)
Nutrition Consult   Received consult for TF initiation and management. Patient was extubated this morning. Plans for bedside swallow evaluation by RN, then diet advancement. No nutrition needs at this time.  Molli Barrows, RD, LDN, [place] Pager [phone] After Hours Pager [phone]

## 2019-04-04 NOTE — Progress Notes (Signed)
eLink Physician-Brief Progress Note Patient Name: Brandon Thomas DOB: [birth year] MRN: [id number]   Date of Service  04/04/2019  HPI/Events of Note  PT desaturated and lost his tidal volumes, he was suctioned with a plug being aspirated, he was bagged with a PEEP valve for a bit and put back on the ventilator with recovery of his tidal volumes and saturation, however he was noted to be wheezing and peak airway pressures were high.  eICU Interventions  Albuterol MDI 8 puffs Q 4 hours PRN, stat portable CXR ordered, Versed 2 mg iv x 2 given to optimize sedation.        Kerry Kass Shayle Donahoo 04/04/2019, 6:49 AM

## 2019-04-04 NOTE — Progress Notes (Signed)
[facility] Progress Note Patient Name: Brandon Thomas DOB: [birth year] MRN: [id number]   Date of Service  04/04/2019  HPI/Events of Note  Request for resumption of Precedex for MRI. Patient currently appears calm.  eICU Interventions  Discussed with bedside RN and patient is reportedly confused and may not tolerate lying still for MRI. OKd resumption of Precedex only for MRI purpose.     Intervention Category Minor Interventions: Agitation / anxiety - evaluation and management  Judd Lien 04/04/2019, 11:17 PM

## 2019-04-04 NOTE — Procedures (Signed)
Extubation Procedure Note  Patient Details:   Name: ONIX JUMPER DOB: 1975-10-30 MRN: [id number]   Airway Documentation:  Airway 7.5 mm (Active)  Secured at (cm) 24 cm 04/04/19 0748  Measured From Lips 04/04/19 0748  Secured Location Right 04/04/19 0748  Secured By [employer] 04/04/19 0748  Tube Holder Repositioned Yes 04/04/19 0748  Cuff Pressure (cm H2O) 28 cm H2O 04/04/19 0748  Site Condition Dry 04/04/19 0748   Vent end date: (not recorded) Vent end time: (not recorded)   Evaluation  O2 sats: transiently fell during during procedure Complications: Complications of needed to be placed on BIPAP Patient did tolerate procedure well. Bilateral Breath Sounds: Clear   Yes  Dimple Nanas 04/04/2019, 8:37 AM

## 2019-04-04 NOTE — H&P (Signed)
NAME:  Brandon Thomas, MRN:  [id number], DOB:  [birth year], LOS: 1 ADMISSION DATE:  04/02/2019, CONSULTATION DATE:  04/03/2019 REFERRING MD:  Dr. Dayna Barker, CHIEF COMPLAINT:  AMS/ suspected OD  Brief History   43 year old male with history of polysubstance abuse found unresponsive at home by wife, LSW 8 hrs earlier.  Suspected drug overdose.  Developed possible seizure like activity with EMS.  Required intubation in ER but since waking up following intermittent commands.  Found with suspected aspiration, AKI, rhabdo, hyperkalemia, transaminitis, and hypotension.  PCCM called for admit.    History of present illness   HPI obtained from medical chart review as patient is intubated and sedated on mechanical ventilation and per wife, Brandon Thomas at bedside.   43 year old male with history of polysubstance abuse presenting to ER after being found unresponsive.   Wife states patient long history of drug abuse for over 10 years.  Most recently out of rehab in September.  Wife does not think he has been on suboxone since rehab.  She reports that he has been acting strange over the last week and picking at his skin, with missing money, and suspicious contacts on his phone concerning for relapse.  In the past, patient has taken whatever he can get, including hx of IVDA.  Additionally, has taken [place], last injected one week ago seen by wife in which she states he takes for his hypogonadism instead of going to doctor.  No concerns from wife over possible SI attempt.  She last saw him around 1130 in his normal state of health.  She was unable to reach him by phone, so she came home and found him face down on the floor making some noises but otherwise unresponsiveby his wife around 1900 unresponsive and blue. Wife started CPR until her neighbor arrived who did not continue CPR.  Was given Narcan by Fire without response.  EMS found patient spontaneously breathing who then developed  possible decerebrate posturing and seizure like which was treated with versed.   In Er, patient arrived unresponsive requiring intubation for airway protection, Tmax in ER 101.2, tachycardic, and then developed hypotension after intubation and propofol which has since been changed to versed and fentanyl gtts with some improvement in SBP with fluid boluses.  [name] following intermittent commands and requiring restraints.  Neurology was consulted, loaded with keppra and currently on EEG. Labs noted for K 6.6, sCr 2.55, glucose 66, AST/ ALT 192/ 147, WBC 22.5, neg ETOH, UDS + benzo, Lactic acid 8.8, CK 9495, normal coags, UA with >500 glucose, large Hgb and protein 100.  ABG showing 7.257/ 50.4/ 106/ 22.4.  He is currently on his third liter of IVF with borderline hypotension.  He was treated with stabillizing measures for hyperkalemia, without EKG changes.  Empirically started on vancomycin and zosyn.  PCCM called for admit.   Past Medical History  Polysubstance abuse, HLD, hypogonadism, hx of elevated LFTs  Significant Hospital Events   11/26 Admit  Consults:  Neurology  Procedures:  11/25 ETT >>  11/25 Foley   Significant Diagnostic Tests:  11/25 pelvis xr >> neg  11/25 CT head/ cervical  >> 1. No acute intracranial abnormality. 2. Mild right frontal and supraorbital scalp swelling without subjacent calvarial fracture. 3. Motion artifact degrades of cervical spine imaging quality. May limit detection of subtle, nondisplaced fractures. No definite acute cervical spine fracture or traumatic listhesis. 4. Minimal cervical spondylitic changes  11/25  CT chest >> 1. Extensive areas of consolidation and volume loss posteriorly within the lungs with some air bronchograms, findings may reflect marked hypoventilatory changes in the setting of overdose though some underlying infection and/or sequela of aspiration may be present as well particularly given the airways thickening  and secretions below the endotracheal balloon. 2. Endotracheal tube terminates 3.2 cm from the carina. 3. Transesophageal tube tip terminates within the gastric lumen with the side port distal to the GE junction.  11/27 CXR >>  1. Endotracheal tube 4.6 cm from the carina.Transesophageal tube tip and side port distal to the GE junction. 2. Worsening bibasilar opacities; possibly atelectasis versus pneumonia or aspiration.  Micro Data:  11/26 SARS CoV2 >> neg 11/26 BC x 2 >> 11/26 trach asp >>  Antimicrobials:  11/26 vanc >> 11/26 zosyn x1 11/26 ceftriaxone >>  Interim history/subjective:  RN reports episode of desaturation with questionable mucus plugging, this resolved with lavage and increased sedation. Seen with agitation and AMS with removal of sedation.  Objective   Blood pressure 120/82, pulse 79, temperature 97.6 F (36.4 C), temperature source Axillary, resp. rate 20, height _0  (1.778 m), weight 101.9 kg, SpO2 100 %.    Vent Mode: PRVC FiO2 (%):  [40 %-100 %] 40 % Set Rate:  [20 bmp] 20 bmp Vt Set:  [580 mL] 580 mL PEEP:  [5 cmH20] 5 cmH20 Plateau Pressure:  [18 cmH20-20 cmH20] 19 cmH20   Intake/Output Summary (Last 24 hours) at 04/04/2019 0738 Last data filed at 04/04/2019 0600 Gross per 24 hour  Intake 7816 ml  Output 3950 ml  Net 3866 ml   Filed Weights   04/02/19 2110 04/03/19 0217 04/04/19 0336  Weight: 104.3 kg 99.2 kg 101.9 kg   Examination: General: Well nourished adult male on mechanical ventilation with transition to BIPAP, in NAD HEENT: ETT, MM pink/moist, PERRL,  Neuro: Alert but encephalopathic, unable to follow commands, moves all extremities spontaneously   CV: s1s2 regular rate and rhythm, no murmur, rubs, or gallops,  PULM:  Bilateral rhonchi, non labored respirations  GI: soft, bowel sounds active in all 4 quadrants, non-tender, non-distended Extremities: warm/dry, left upper extremity swelling  Skin: area to left chest and right arm  as below  Left chest    RUE:  circumference of forearm measuring 31 cm, borders marked, has palpable pulse and wiggles fingers on command in right hand   Resolved Hospital Problem list    Assessment & Plan:   Acute respiratory insufficiency Probable aspiration pneumonia P:  Extubated to BIPAP due to inability to effectively breath  Wean FiO2 for sats greater than 90%. Head of bed elevated 30 degrees. Follow intermittent chest x-ray and ABG.   Ensure adequate pulmonary hygiene  Follow cultures   Hypotension -likely multifactorial sepsis +/- sedation, s/p aggressive IV hydration  P:  Continuous telemetry No longer requiring pressors  MAO goal >65 Minimize sedation as able   Sepsis/ SIRS HIV and Hepatitis panel negative  P:  Follow cultures Continue IV antibiotics  Procalitonin elevated  Trend WBC / fever curve   R/o seizure P:  Neurology following, appreciate assistance  EEG 11/26 with no acute evidence of seizures Keppra per neurology  Seizure precautions   Acute encephalopathy most likely related to drug overdose  - CTH/ cervical as above, no acute changes -Tylenol / Salicylate WNL -Toxicology positive for benzos, Ammonia slightly elevated at 44 P:  Frequent neuro exams  PAD protocol with fentanyl and propofol  May require Precedex  post extubation  Delirium precautions   AKI Rhabdomylosis  Hyperkalemia  AGMA/ lactic acidosis  -S/P aggressive IV hydration  P:  AKI improving creatinine now WNL CK remains significantly elevated, Trend  Potassium now WNL Strict I/O  IV hydration  R forearm erythema - possible pressure area (was found face down) vs ? Cellulitis from ?IVDA P:  Monitor for excessive swelling for concern of developing compartement syndrome  Frequent neurovascular checks   Transaminitis  - hx of, wife denies regular ETOH abuse - normal coags P:  Trend LFTs  Hypophosphatemia  P: Supplementation as needed   Best practice:    Diet: NPO Pain/Anxiety/Delirium protocol (if indicated): Fentanyl, precedex, prn versed VAP protocol (if indicated): yes DVT prophylaxis: SCDs, heparin sq GI prophylaxis: PPI Glucose control: CBG q 4 Mobility: BR Code Status: Full Family Communication: Wife, Brandon Thomas at bedside [phone], updated  Disposition: ICU  Labs   CBC: Recent Labs  Lab 04/02/19 2109 04/02/19 2137 04/02/19 2205 04/03/19 0138 04/03/19 0323 04/04/19 0645  WBC 22.5*  --   --   --  14.1* 12.0*  NEUTROABS  --   --   --   --   --  7.5  HGB 16.9 17.3* 16.0 13.9 14.3 13.2  HCT 54.9* 51.0 47.0 41.0 43.7 41.4  MCV 95.8  --   --   --  89.9 92.4  PLT 488*  --   --   --  309 542    Basic Metabolic Panel: Recent Labs  Lab 04/02/19 2109 04/02/19 2137  04/03/19 0138 04/03/19 0214 04/03/19 0323 04/03/19 0605 04/03/19 0931 04/03/19 1300 04/03/19 1849 04/03/19 2333 04/04/19 0526  NA 139 140   < > 135  --  140 141  --  140  --   --  145  K 6.6* 5.6*   < > 6.0*  --  6.3* 5.6* 4.5 4.2 4.3 3.9 3.6  CL 99 106  --   --   --  106 108  --  109  --   --  111  CO2 18*  --   --   --   --  23 21*  --  22  --   --  19*  GLUCOSE 66* 68*  --   --   --  85 97  --  101*  --   --  79  BUN 21* 25*  --   --   --  25* 28*  --  25*  --   --  18  CREATININE 2.55* 2.10*  --   --   --  2.09* 2.40*  --  1.95*  --   --  1.24  CALCIUM 8.9  --   --   --   --  7.7* 7.7*  --  8.0*  --   --  8.3*  MG  --   --   --   --  3.2*  --   --   --   --  1.9  --  2.0  PHOS  --   --   --   --  11.0*  --   --   --   --  1.6*  --  1.1*   < > = values in this interval not displayed.   GFR: Estimated Creatinine Clearance: 91 mL/min (by C-G formula based on SCr of 1.24 mg/dL). Recent Labs  Lab 04/02/19 2109 04/03/19 0013 04/03/19 0143 04/03/19 0254 04/03/19 0323 04/04/19 0645  PROCALCITON  --   --  14.02  --   --   --   WBC 22.5*  --   --   --  14.1* 12.0*  LATICACIDVEN 8.8* 4.1*  --  5.2*  --   --     Liver Function Tests: Recent  Labs  Lab 04/02/19 2109 04/03/19 0323  AST 192* 276*  ALT 147* 149*  ALKPHOS 100 47  BILITOT 0.6 0.8  PROT 7.9 5.3*  ALBUMIN 4.6 3.1*   No results for input(s): LIPASE, AMYLASE in the last 168 hours. Recent Labs  Lab 04/03/19 0323  AMMONIA 44*    ABG    Component Value Date/Time   PHART 7.428 04/03/2019 0138   PCO2ART 33.9 04/03/2019 0138   PO2ART 145.0 (H) 04/03/2019 0138   HCO3 22.4 04/03/2019 0138   TCO2 23 04/03/2019 0138   ACIDBASEDEF 1.0 04/03/2019 0138   O2SAT 99.0 04/03/2019 0138     Coagulation Profile: Recent Labs  Lab 04/02/19 2201 04/03/19 0323  INR 1.1 1.1    Cardiac Enzymes: Recent Labs  Lab 04/02/19 2109 04/03/19 0323 04/03/19 0605  CKTOTAL 9,495* 27,261* 25,638*    HbA1C: No results found for: HGBA1C  CBG: Recent Labs  Lab 04/03/19 1939 04/03/19 2054 04/03/19 2326 04/03/19 2328 04/04/19 0329  GLUCAP 66* 79 66* 71 84    Review of Systems:   Unable   Past Medical History  He,  has a past medical history of Depression, Elevated LFTs (08/20/2013), and Hyperlipidemia (08/20/2013).   Surgical History    Past Surgical History:  Procedure Laterality Date   WISDOM TOOTH EXTRACTION       Social History   reports that he has never smoked. His smokeless tobacco use includes chew. He reports current alcohol use of about 1.0 standard drinks of alcohol per week. He reports that he does not use drugs.   Family History   His family history is not on file. He was adopted.   Allergies Allergies  Allergen Reactions   Atorvastatin     RUQ pain   Crestor [Rosuvastatin]     Elevated LFTs     Home Medications  Prior to Admission medications   Medication Sig Start Date End Date Taking? Authorizing Provider  diltiazem 2 % GEL Apply 1 application topically 5 (five) times daily. 08/10/15   Irene Shipper, MD  Multiple Vitamin (MULTIVITAMIN) tablet Take 1 tablet by mouth daily.    [provider]  Na Sulfate-K Sulfate-Mg Sulf  SOLN Take 1 kit by mouth once. 08/10/15   Irene Shipper, MD  NEEDLE, DISP, 22 G 22G X 1-1/2" MISC Inject into skin every 14 days. 09/10/15   Marcial Pacas, DO  Omega-3 Fatty Acids (FISH OIL PO) Take by mouth 2 (two) times daily.    [provider]  pravastatin (PRAVACHOL) 20 MG tablet Take 1 tablet (20 mg total) by mouth daily. 06/29/15   Marcial Pacas, DO  Syringe, Disposable, 3 ML MISC Inject into skin every 14 days. 09/10/15   Marcial Pacas, DO  testosterone cypionate (DEPOTESTOSTERONE CYPIONATE) 200 MG/ML injection Inject 1 mL (200 mg total) into the muscle every 14 (fourteen) days. 09/10/15   Marcial Pacas, DO     Critical care time: 60 mins    Performed by: Johnsie Cancel   Total critical care time: 35 minutes  Critical care time was exclusive of separately billable procedures and treating other patients.  Critical care was necessary to treat or prevent imminent or life-threatening deterioration.  Critical care was time  spent personally by me on the following activities: development of treatment plan with patient and/or surrogate as well as nursing, discussions with consultants, evaluation of patient's response to treatment, examination of patient, obtaining history from patient or surrogate, ordering and performing treatments and interventions, ordering and review of laboratory studies, ordering and review of radiographic studies, pulse oximetry and re-evaluation of patient's condition.   Johnsie Cancel, NP-C [facility] After hours pager: [phone]. 04/04/2019, 8:09 AM

## 2019-04-04 NOTE — Progress Notes (Signed)
[facility] Progress Note Patient Name: MAGUIRE SIME DOB: Nov 01, 1975 MRN: [id number]   Date of Service  04/04/2019  HPI/Events of Note  Pt is agitated  eICU Interventions  Versed 2 mg iv x 1, increase Fentanyl infusion rate to 400 mcg.        Kerry Kass Ogan 04/04/2019, 6:21 AM

## 2019-04-04 NOTE — Progress Notes (Signed)
Pharmacy Antibiotic Note  Brandon Thomas is a 43 y.o. male admitted on 04/02/2019 with sepsis. Concern for cns infection. Renal fx significantly improved this am  Plan: Continue ceftriaxone vanc 1 g q8h Monitor vanc tr as needed  Temp (24hrs), Avg:98.7 F (37.1 C), Min:97.5 F (36.4 C), Max:100 F (37.8 C)  Recent Labs  Lab 04/02/19 2109 04/02/19 2137 04/03/19 0013 04/03/19 0254 04/03/19 0323 04/03/19 0605 04/03/19 1300 04/03/19 2333 04/04/19 0526 04/04/19 0645  WBC 22.5*  --   --   --  14.1*  --   --   --   --  12.0*  CREATININE 2.55* 2.10*  --   --  2.09* 2.40* 1.95*  --  1.24  --   LATICACIDVEN 8.8*  --  4.1* 5.2*  --   --   --   --   --   --   VANCORANDOM  --   --   --   --   --   --   --  7  --   --     Estimated Creatinine Clearance: 91 mL/min (by C-G formula based on SCr of 1.24 mg/dL).    Allergies  Allergen Reactions  . Atorvastatin     RUQ pain  . Crestor [Rosuvastatin]     Elevated LFTs   Barth Kirks, PharmD, BCPS, BCCCP Clinical Pharmacist [phone]  Please check AMION for all [facility] numbers  04/04/2019 9:25 AM

## 2019-04-04 NOTE — Progress Notes (Signed)
Pharmacy Antibiotic Note  Brandon Thomas is a 43 y.o. male admitted on 04/02/2019 with sepsis.  Pharmacy has been consulted for vancomycin dosing.  Random vanc level 7 so pt likely clearing drug well though has only received one dose; SCr improved some 2.55>2.1>2.4>1.95.  Plan: Vancomycin 2057m IV x1; consider scheduled dosing after am labs.  Temp (24hrs), Avg:100.9 F (38.3 C), Min:97.5 F (36.4 C), Max:103.1 F (39.5 C)  Recent Labs  Lab 04/02/19 2109 04/02/19 2137 04/03/19 0013 04/03/19 0254 04/03/19 0323 04/03/19 0605 04/03/19 1300 04/03/19 2333  WBC 22.5*  --   --   --  14.1*  --   --   --   CREATININE 2.55* 2.10*  --   --  2.09* 2.40* 1.95*  --   LATICACIDVEN 8.8*  --  4.1* 5.2*  --   --   --   --   VANCORANDOM  --   --   --   --   --   --   --  7    Estimated Creatinine Clearance: 57.1 mL/min (A) (by C-G formula based on SCr of 1.95 mg/dL (H)).    Allergies  Allergen Reactions  . Atorvastatin     RUQ pain  . Crestor [Rosuvastatin]     Elevated LFTs     Thank you for allowing pharmacy to be a part of this patient's care.  VWynona Neat PharmD, BCPS  04/04/2019 12:17 AM

## 2019-04-05 ENCOUNTER — Inpatient Hospital Stay (HOSPITAL_COMMUNITY): Payer: BLUE CROSS/BLUE SHIELD

## 2019-04-05 DIAGNOSIS — N179 Acute kidney failure, unspecified: Secondary | ICD-10-CM

## 2019-04-05 DIAGNOSIS — M7989 Other specified soft tissue disorders: Secondary | ICD-10-CM

## 2019-04-05 DIAGNOSIS — I330 Acute and subacute infective endocarditis: Secondary | ICD-10-CM

## 2019-04-05 DIAGNOSIS — J9601 Acute respiratory failure with hypoxia: Secondary | ICD-10-CM

## 2019-04-05 LAB — BASIC METABOLIC PANEL
Anion gap: 13 (ref 5–15)
BUN: 9 mg/dL (ref 6–20)
CO2: 26 mmol/L (ref 22–32)
Calcium: 9.1 mg/dL (ref 8.9–10.3)
Chloride: 107 mmol/L (ref 98–111)
Creatinine, Ser: 1.15 mg/dL (ref 0.61–1.24)
GFR calc Af Amer: >60 mL/min (ref >60)
GFR calc non Af Amer: >60 mL/min (ref >60)
Glucose, Bld: 101 mg/dL — ABNORMAL HIGH (ref 70–99)
Potassium: 3.7 mmol/L (ref 3.5–5.1)
Sodium: 146 mmol/L — ABNORMAL HIGH (ref 135–145)

## 2019-04-05 LAB — GLUCOSE, CAPILLARY
Glucose-Capillary: 108 mg/dL — ABNORMAL HIGH (ref 70–99)
Glucose-Capillary: 119 mg/dL — ABNORMAL HIGH (ref 70–99)
Glucose-Capillary: 123 mg/dL — ABNORMAL HIGH (ref 70–99)
Glucose-Capillary: 140 mg/dL — ABNORMAL HIGH (ref 70–99)
Glucose-Capillary: 91 mg/dL (ref 70–99)
Glucose-Capillary: 99 mg/dL (ref 70–99)

## 2019-04-05 LAB — PHOSPHORUS: Phosphorus: 3.1 mg/dL (ref 2.5–4.6)

## 2019-04-05 LAB — ECHOCARDIOGRAM LIMITED
Height: 70 in
Weight: 3626.13 oz

## 2019-04-05 LAB — CBC
HCT: 37.8 % — ABNORMAL LOW (ref 39.0–52.0)
Hemoglobin: 12.3 g/dL — ABNORMAL LOW (ref 13.0–17.0)
MCH: 29.2 pg (ref 26.0–34.0)
MCHC: 32.5 g/dL (ref 30.0–36.0)
MCV: 89.8 fL (ref 80.0–100.0)
Platelets: 293 10*3/uL (ref 150–400)
RBC: 4.21 MIL/uL — ABNORMAL LOW (ref 4.22–5.81)
RDW: 13.8 % (ref 11.5–15.5)
WBC: 10.1 10*3/uL (ref 4.0–10.5)
nRBC: 0 % (ref 0.0–0.2)

## 2019-04-05 LAB — HEPATIC FUNCTION PANEL
ALT: 295 U/L — ABNORMAL HIGH (ref 0–44)
AST: 491 U/L — ABNORMAL HIGH (ref 15–41)
Albumin: 3.1 g/dL — ABNORMAL LOW (ref 3.5–5.0)
Alkaline Phosphatase: 41 U/L (ref 38–126)
Bilirubin, Direct: <0.1 mg/dL (ref 0.0–0.2)
Total Bilirubin: 0.7 mg/dL (ref 0.3–1.2)
Total Protein: 5.7 g/dL — ABNORMAL LOW (ref 6.5–8.1)

## 2019-04-05 LAB — MAGNESIUM: Magnesium: 1.9 mg/dL (ref 1.7–2.4)

## 2019-04-05 LAB — PROTIME-INR
INR: 1.1 (ref 0.8–1.2)
Prothrombin Time: 14.3 seconds (ref 11.4–15.2)

## 2019-04-05 MED ORDER — ONDANSETRON HCL 4 MG/2ML IJ SOLN
4.0000 mg | Freq: Four times a day (QID) | INTRAMUSCULAR | Status: DC | PRN
  Administered 2019-04-05: 4 mg via INTRAVENOUS

## 2019-04-05 MED ORDER — VANCOMYCIN HCL IN DEXTROSE 1-5 GM/200ML-% IV SOLN
1000.0000 mg | Freq: Two times a day (BID) | INTRAVENOUS | Status: DC
  Administered 2019-04-05 – 2019-04-06 (×2): 1000 mg via INTRAVENOUS
  Filled 2019-04-05 (×2): qty 200

## 2019-04-05 MED ORDER — VANCOMYCIN HCL IN DEXTROSE 1-5 GM/200ML-% IV SOLN
1000.0000 mg | Freq: Once | INTRAVENOUS | Status: AC
  Administered 2019-04-05: 1000 mg via INTRAVENOUS
  Filled 2019-04-05: qty 200

## 2019-04-05 NOTE — Progress Notes (Signed)
Pharmacy Antibiotic Note  Brandon Thomas is a 43 y.o. male admitted on 04/02/2019 and now with Staph Aureus PNA - pharmacy consulted to start Vancomycin.   The patient is noted to have AKI related to rhaddo on admission - resolving with SCr down to 1.15, CrCl~80-100 ml/min.   Plan: - Restart Vancomycin at 1g IV every 12 hours (est AUC 521, SCr 1.15, Vd 0.5) - Will continue to follow renal function, culture results, LOT, and antibiotic de-escalation plans   Height: 5' 10"  (177.8 cm) Weight: 226 lb 10.1 oz (102.8 kg) IBW/kg (Calculated) : 73  Temp (24hrs), Avg:98.6 F (37 C), Min:97.5 F (36.4 C), Max:99.5 F (37.5 C)  Recent Labs  Lab 04/02/19 2109 04/02/19 2137 04/03/19 0013 04/03/19 0254 04/03/19 0323 04/03/19 0605 04/03/19 1300 04/03/19 2333 04/04/19 0526 04/04/19 0645 04/04/19 1034  WBC 22.5*  --   --   --  14.1*  --   --   --   --  12.0*  --   CREATININE 2.55* 2.10*  --   --  2.09* 2.40* 1.95*  --  1.24  --   --   LATICACIDVEN 8.8*  --  4.1* 5.2*  --   --   --   --   --   --  1.1  VANCORANDOM  --   --   --   --   --   --   --  7  --   --   --     Estimated Creatinine Clearance: 91.3 mL/min (by C-G formula based on SCr of 1.24 mg/dL).    Allergies  Allergen Reactions  . Atorvastatin     RUQ pain  . Crestor [Rosuvastatin]     Elevated LFTs    Antimicrobials this admission: Vanc 11/26>> 11/27; restart 11/28 Zosyn 11/26x1 Ceftriaxone 11/26>>  Dose adjustments this admission:   Microbiology results: 11/25 covid neg 11/25 blood x 2 >> 1/2 CoNS 11/26 resp >> staph aureus (pending sens) 11/26 CSF cx >> ngx2d 11/26 MRSA PCR >> neg 11/28 BCx >>   Thank you for allowing pharmacy to be a part of this patient's care.  Alycia Rossetti, PharmD, BCPS Clinical Pharmacist Clinical phone for 04/05/2019: [phone] 04/05/2019 10:58 AM   **Pharmacist phone directory can now be found on [email or web address] ([email or web address]).  Listed under [place].

## 2019-04-05 NOTE — Progress Notes (Signed)
240 ml of fentanyl wasted in the med room via fluid wasted receptacle with Archivist as witness.

## 2019-04-05 NOTE — Progress Notes (Signed)
NAME:  Brandon Thomas, MRN:  [id number], DOB:  1976/02/15, LOS: 2 ADMISSION DATE:  04/02/2019, CONSULTATION DATE:  04/03/2019 REFERRING MD:  Dr. Dayna Barker, CHIEF COMPLAINT:  AMS/ suspected OD  Brief History   43 year old male with history of polysubstance abuse found unresponsive at home by wife, LSW 8 hrs earlier.  Suspected drug overdose.  Developed possible seizure like activity with EMS.  Required intubation in ER but since waking up following intermittent commands.  Found with suspected aspiration, AKI, rhabdo, hyperkalemia, transaminitis, and hypotension.  PCCM called for admit.    History of present illness   HPI obtained from medical chart review as patient is intubated and sedated on mechanical ventilation and per wife, [name] at bedside.   43 year old male with history of polysubstance abuse presenting to ER after being found unresponsive.   Wife states patient long history of drug abuse for over 10 years.  Most recently out of rehab in September.  Wife does not think he has been on suboxone since rehab.  She reports that he has been acting strange over the last week and picking at his skin, with missing money, and suspicious contacts on his phone concerning for relapse.  In the past, patient has taken whatever he can get, including hx of IVDA.  Additionally, has [place], last injected one week ago seen by wife in which she states he takes for his hypogonadism instead of going to doctor.  No concerns from wife over possible SI attempt.  She last saw him around 1130 in his normal state of health.  She was unable to reach him by phone, so she came home and found him face down on the floor making some noises but otherwise unresponsiveby his wife around 1900 unresponsive and blue. Wife started CPR until her neighbor arrived who did not continue CPR.  Was given Narcan by Fire without response.  EMS found patient spontaneously breathing who then developed  possible decerebrate posturing and seizure like which was treated with versed.   In Er, patient arrived unresponsive requiring intubation for airway protection, Tmax in ER 101.2, tachycardic, and then developed hypotension after intubation and propofol which has since been changed to versed and fentanyl gtts with some improvement in SBP with fluid boluses.  [name] following intermittent commands and requiring restraints.  Neurology was consulted, loaded with keppra and currently on EEG. Labs noted for K 6.6, sCr 2.55, glucose 66, AST/ ALT 192/ 147, WBC 22.5, neg ETOH, UDS + benzo, Lactic acid 8.8, CK 9495, normal coags, UA with >500 glucose, large Hgb and protein 100.  ABG showing 7.257/ 50.4/ 106/ 22.4.  He is currently on his third liter of IVF with borderline hypotension.  He was treated with stabillizing measures for hyperkalemia, without EKG changes.  Empirically started on vancomycin and zosyn.  PCCM called for admit.   Past Medical History  Polysubstance abuse, HLD, hypogonadism, hx of elevated LFTs  Significant Hospital Events   11/26 Admit 11/27 extubated  Consults:  Neurology  Significant Diagnostic Tests:  11/25 pelvis xr >> neg  11/25 CT head/ cervical  >> 1. No acute intracranial abnormality. 2. Mild right frontal and supraorbital scalp swelling without subjacent calvarial fracture. 3. Motion artifact degrades of cervical spine imaging quality. May limit detection of subtle, nondisplaced fractures. No definite acute cervical spine fracture or traumatic listhesis. 4. Minimal cervical spondylitic changes  11/25 CT chest >> 1. Extensive areas of consolidation  and volume loss posteriorly within the lungs with some air bronchograms, findings may reflect marked hypoventilatory changes in the setting of overdose though some underlying infection and/or sequela of aspiration may be present as well particularly given the airways thickening and secretions below the  endotracheal balloon. 2. Endotracheal tube terminates 3.2 cm from the carina. 3. Transesophageal tube tip terminates within the gastric lumen with the side port distal to the GE junction.  11/27 CXR >>  1. Endotracheal tube 4.6 cm from the carina.Transesophageal tube tip and side port distal to the GE junction. 2. Worsening bibasilar opacities; possibly atelectasis versus pneumonia or aspiration.  11/27 MRI Brain- diffuse hippocampal injury  Micro Data:  11/26 SARS CoV2 >> neg 11/26 BC x 2 >> 11/26 trach asp >> staph aureus  Antimicrobials:  11/26 vanc >> 11/26 zosyn x1 11/26 ceftriaxone >>11/28  Interim history/subjective:  Tolerated extubation well. Has significant memory impairments requiring frequent reorientation. MRI results discussed with neurology, apprarently this is a classic CNS injury from fentanyl.  Objective   Blood pressure 131/75, pulse 72, temperature 99 F (37.2 C), temperature source Oral, resp. rate (!) 22, height 5' 10"  (1.778 m), weight 102.8 kg, SpO2 93 %.        Intake/Output Summary (Last 24 hours) at 04/05/2019 1044 Last data filed at 04/05/2019 0900 Gross per 24 hour  Intake 5492.7 ml  Output 6615 ml  Net -1122.3 ml   Filed Weights   04/03/19 0217 04/04/19 0336 04/05/19 0422  Weight: 99.2 kg 101.9 kg 102.8 kg   Examination: GEN: middle aged confused man HEENT: Trachea midline, MMM CV: RRR, ext warm PULM: CTAB, no accessory muscle use GI: Soft, +BS EXT: No edema NEURO: Moves all 4 ext to command, CN2-12 intact PSYCH: AO to self and place not date, cannot state children's names, knows he has two of them, no aphasia, seems all memory related SKIN: redness in left chest and arm are stable from pics below  Left chest      Resolved Hospital Problem list    Assessment & Plan:  # 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, has also been reported with cocaine but utox neg for this # Rhabdomyolysis from prolonged  downtime # Transaminitis and elevated troponins related to rhabdomyolysis # Fentanyl-induced hippocampal injury, 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 Okay for transfer to stepdown  Erskine Emery MD

## 2019-04-05 NOTE — Progress Notes (Signed)
  Echocardiogram 2D Echocardiogram has been performed.  Merrie Roof F 04/05/2019, 3:38 PM

## 2019-04-05 NOTE — Progress Notes (Signed)
VASCULAR LAB PRELIMINARY  PRELIMINARY  PRELIMINARY  PRELIMINARY  Right upper extremity venous duplex completed.    Preliminary report:  See CV proc for preliminary results.  Maurine Minister, RN results.  Lindzie Boxx, RVT 04/05/2019, 4:03 PM

## 2019-04-06 ENCOUNTER — Inpatient Hospital Stay: Payer: Self-pay

## 2019-04-06 DIAGNOSIS — T796XXD Traumatic ischemia of muscle, subsequent encounter: Secondary | ICD-10-CM

## 2019-04-06 DIAGNOSIS — T50901D Poisoning by unspecified drugs, medicaments and biological substances, accidental (unintentional), subsequent encounter: Secondary | ICD-10-CM

## 2019-04-06 DIAGNOSIS — J690 Pneumonitis due to inhalation of food and vomit: Secondary | ICD-10-CM

## 2019-04-06 DIAGNOSIS — R7881 Bacteremia: Secondary | ICD-10-CM

## 2019-04-06 LAB — CBC
HCT: 38 % — ABNORMAL LOW (ref 39.0–52.0)
Hemoglobin: 12.2 g/dL — ABNORMAL LOW (ref 13.0–17.0)
MCH: 29 pg (ref 26.0–34.0)
MCHC: 32.1 g/dL (ref 30.0–36.0)
MCV: 90.5 fL (ref 80.0–100.0)
Platelets: 284 10*3/uL (ref 150–400)
RBC: 4.2 MIL/uL — ABNORMAL LOW (ref 4.22–5.81)
RDW: 13.7 % (ref 11.5–15.5)
WBC: 9.4 10*3/uL (ref 4.0–10.5)
nRBC: 0 % (ref 0.0–0.2)

## 2019-04-06 LAB — GLUCOSE, CAPILLARY
Glucose-Capillary: 94 mg/dL (ref 70–99)
Glucose-Capillary: 94 mg/dL (ref 70–99)
Glucose-Capillary: 86 mg/dL (ref 70–99)
Glucose-Capillary: 92 mg/dL (ref 70–99)
Glucose-Capillary: 107 mg/dL — ABNORMAL HIGH (ref 70–99)

## 2019-04-06 LAB — BASIC METABOLIC PANEL
Anion gap: 10 (ref 5–15)
BUN: 7 mg/dL (ref 6–20)
CO2: 26 mmol/L (ref 22–32)
Calcium: 8.5 mg/dL — ABNORMAL LOW (ref 8.9–10.3)
Chloride: 108 mmol/L (ref 98–111)
Creatinine, Ser: 1.06 mg/dL (ref 0.61–1.24)
GFR calc Af Amer: >60 mL/min (ref >60)
GFR calc non Af Amer: >60 mL/min (ref >60)
Glucose, Bld: 100 mg/dL — ABNORMAL HIGH (ref 70–99)
Potassium: 3.3 mmol/L — ABNORMAL LOW (ref 3.5–5.1)
Sodium: 144 mmol/L (ref 135–145)

## 2019-04-06 LAB — HEPATIC FUNCTION PANEL
ALT: 276 U/L — ABNORMAL HIGH (ref 0–44)
AST: 300 U/L — ABNORMAL HIGH (ref 15–41)
Albumin: 2.7 g/dL — ABNORMAL LOW (ref 3.5–5.0)
Alkaline Phosphatase: 43 U/L (ref 38–126)
Bilirubin, Direct: 0.1 mg/dL (ref 0.0–0.2)
Indirect Bilirubin: 0.7 mg/dL (ref 0.3–0.9)
Total Bilirubin: 0.8 mg/dL (ref 0.3–1.2)
Total Protein: 5.4 g/dL — ABNORMAL LOW (ref 6.5–8.1)

## 2019-04-06 LAB — CSF CULTURE W GRAM STAIN: Culture: NO GROWTH

## 2019-04-06 LAB — CK: Total CK: 12146 U/L — ABNORMAL HIGH (ref 49–397)

## 2019-04-06 MED ORDER — SODIUM CHLORIDE 0.9% FLUSH
10.0000 mL | Freq: Two times a day (BID) | INTRAVENOUS | Status: DC
  Administered 2019-04-06 – 2019-04-07 (×2): 10 mL

## 2019-04-06 MED ORDER — SODIUM CHLORIDE 0.9% FLUSH: 10.0000 mL | INTRAVENOUS | Status: DC | PRN

## 2019-04-06 MED ORDER — VANCOMYCIN HCL IN DEXTROSE 1-5 GM/200ML-% IV SOLN
1000.0000 mg | Freq: Two times a day (BID) | INTRAVENOUS | Status: DC
  Administered 2019-04-06 – 2019-04-07 (×2): 1000 mg via INTRAVENOUS
  Filled 2019-04-06 (×2): qty 200

## 2019-04-06 MED ORDER — SODIUM CHLORIDE 0.9 % IV SOLN
INTRAVENOUS | Status: DC
  Administered 2019-04-06 – 2019-04-07 (×3): via INTRAVENOUS

## 2019-04-06 MED ORDER — POTASSIUM CHLORIDE 10 MEQ/100ML IV SOLN
10.0000 meq | INTRAVENOUS | Status: AC
  Administered 2019-04-06 (×2): 10 meq via INTRAVENOUS
  Filled 2019-04-06 (×2): qty 100

## 2019-04-06 MED ORDER — STERILE WATER FOR INJECTION IV SOLN
INTRAVENOUS | Status: DC
  Administered 2019-04-06 – 2019-04-07 (×2): via INTRAVENOUS
  Filled 2019-04-06 (×2): qty 9.71

## 2019-04-06 NOTE — Progress Notes (Signed)
Subjective: MRI revealed changes consistent with fentanyl overdose  Exam: Vitals:   04/06/19 1000 04/06/19 1100  BP: (!) 137/94 121/84  Pulse: 87 66  Resp: 15 20  Temp:    SpO2: 93% 99%   Gen: In bed, NAD Psych: appears anxious Resp: non-labored breathing, no acute distress Abd: soft, nt  Neuro: MS: Awake, able to give year, but not month, gives kids ages as 10 and 66 (they are 8 and 21) CN: PERRL, face symmetric, EOMI, VF F Motor: 5/5 throughout Sensory: Intact to light touch  Pertinent Labs: Ammonia 44 UDS - benzodiazepines CSF   0 rbc   2 wbc  32 protein  71 glucose   Impression: 43 yo M with 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  This is a relatively recently described entity, but has been much more widely recognized in the past couple of years.  Given his history of drug abuse, normal CSF, and the circumstances of his being found, I do not think that any further work-up is needed at this time.  Unfortunately, we do not have any urine that was collected prior to being started on fentanyl, but I do not think this is needed given the appearance of the MRI.  At this point, care is supportive.  Given that this is only a relatively recently described entity, prognosis is unclear to me, but I expect him to have some long-term consequences.  He does appear to have had some improvement even over the past few days, and so I hope that he may have some continued improvement.   Recommendations: 1) OT, ST 2) no further neurodiagnostics or neurological interventions at this time, care is supportive from this point forward.  Please call if neurology can be of any further assistance.  Roland Rack, MD [facility] [phone]  If 7pm- 7am, please page neurology on call as listed in [place]. 04/06/2019  11:43 AM

## 2019-04-06 NOTE — Evaluation (Signed)
Occupational Therapy Evaluation Patient Details Name: Brandon Thomas MRN: [id number] DOB: [birth year] Today's Date: 04/06/2019    History of Present Illness Pt is a 43 y/o male with hx of polysubstance abuse found unresponsive at home by wife, LSW 8 hrs earlier with suspected drug overdose. Developed possible seizure like activity with EMS. Found with suspected aspiration, AKI, rhabdo, hyperkalemia, transaminitis, and hypotension. 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 Intubated 11/25- 11/27. R UE superficial vein thrombosis found.   Clinical Impression   PTA patient independent. Admitted for above and limited by problem list below, including impaired balance, generalized weakness, decreased activity tolerance, limited functional use of dominant R UE due to edema, and impaired cognition.  Pt oriented to self, place and time (year); reoriented to month and able to recall within 5 minutes; patient following 1 step commands with increased time, requires cueing for problem solving and safety awareness. Pt repeats self throughout session, specifically about having to pee and how long he has been in the hospital; able to recall kids names and ages with increased time. Continue cognitive assessment. Pt requires min assist for transfers, grooming at sink, LB ADLs and UB ADLs.  He will benefit from continued OT services while admitted and after dc at CIR level in order to optimize independence, safety with ADLs, mobility.  Will follow acutely.     Follow Up Recommendations  CIR;Supervision/Assistance - 24 hour    Equipment Recommendations  Other (comment)(TBD at next venue of care)    Recommendations for Other Services       Precautions / Restrictions Precautions Precautions: Fall Restrictions Weight Bearing Restrictions: No      Mobility Bed Mobility Overal bed mobility: Needs Assistance Bed Mobility: Supine to Sit     Supine to sit: Min  guard     General bed mobility comments: min guard for safety/balance  Transfers Overall transfer level: Needs assistance Equipment used: 1 person hand held assist Transfers: Sit to/from Stand Sit to Stand: Min assist         General transfer comment: min assist to steady     Balance Overall balance assessment: Needs assistance Sitting-balance support: No upper extremity supported;Feet supported Sitting balance-Leahy Scale: Fair Sitting balance - Comments: able to don socks with min guard    Standing balance support: No upper extremity supported;Single extremity supported;During functional activity Standing balance-Leahy Scale: Poor Standing balance comment: relaint on at least 1 UE and external support dynamically                            ADL either performed or assessed with clinical judgement   ADL Overall ADL's : Needs assistance/impaired     Grooming: Minimal assistance;Standing   Upper Body Bathing: Minimal assistance;Sitting   Lower Body Bathing: Minimal assistance;Sit to/from stand   Upper Body Dressing : Sitting;Minimal assistance   Lower Body Dressing: Minimal assistance;Sit to/from stand   Toilet Transfer: Minimal assistance;Ambulation Toilet Transfer Details (indicate cue type and reason): simulated in room         Functional mobility during ADLs: Minimal assistance;Cueing for safety General ADL Comments: pt limited by impaired balance, decreased activity tolerance, generalized weakness and impaired cognition      Vision   Vision Assessment?: No apparent visual deficits Additional Comments: functionally appears [facility], further assessment as needd     Perception     Praxis      Pertinent Vitals/Pain Pain Assessment: No/denies pain  Hand Dominance Right   Extremity/Trunk Assessment Upper Extremity Assessment Upper Extremity Assessment: Generalized weakness;LUE deficits/detail;RUE deficits/detail RUE Deficits / Details:  edema, limited functional use due to disomfort with movement  RUE Coordination: decreased fine motor;decreased gross motor LUE Deficits / Details: appears WFL    Lower Extremity Assessment Lower Extremity Assessment: Defer to PT evaluation   Cervical / Trunk Assessment Cervical / Trunk Assessment: Normal   Communication Communication Communication: No difficulties   Cognition Arousal/Alertness: Awake/alert Behavior During Therapy: Flat affect Overall Cognitive Status: Impaired/Different from baseline Area of Impairment: Orientation;Attention;Following commands;Memory;Safety/judgement;Awareness;Problem solving                 Orientation Level: Disoriented to;Situation(able to report year, month with cueing ) Current Attention Level: Sustained Memory: Decreased recall of precautions;Decreased short-term memory Following Commands: Follows one step commands consistently;Follows one step commands with increased time;Follows multi-step commands inconsistently Safety/Judgement: Decreased awareness of safety;Decreased awareness of deficits Awareness: Intellectual Problem Solving: Slow processing;Decreased initiation;Difficulty sequencing;Requires verbal cues;Requires tactile cues General Comments: pt pleasant and cooperative, repetition of "how long have I been in here" and "I need to pee" during session; fair immediate recall of hoildays; increased time for processing but able to engage in habitual tasks with minimal cueing; labile but appropriately   General Comments  VSS during session     Exercises     Shoulder Instructions      Home Living Family/patient expects to be discharged to:: Private residence Living Arrangements: Spouse/significant other;Children Available Help at Discharge: Family Type of Home: House Home Access: Stairs to enter Technical brewer of Steps: 2 Entrance Stairs-Rails: Right Home Layout: Two level;Able to live on main level with  bedroom/bathroom     Bathroom Shower/Tub: Walk-in shower         Home Equipment: None          Prior Functioning/Environment Level of Independence: Independent        Comments: works in Manufacturing engineer         OT Problem List: Decreased strength;Decreased activity tolerance;Impaired balance (sitting and/or standing);Decreased coordination;Decreased cognition;Decreased range of motion;Decreased safety awareness;Decreased knowledge of use of DME or AE;Decreased knowledge of precautions;Impaired UE functional use;Increased edema      OT Treatment/Interventions: Self-care/ADL training;DME and/or AE instruction;Therapeutic exercise;Therapeutic activities;Cognitive remediation/compensation;Patient/family education;Balance training    OT Goals(Current goals can be found in the care plan section) Acute Rehab OT Goals Patient Stated Goal: to get cleaned up  OT Goal Formulation: With patient Time For Goal Achievement: 04/20/19 Potential to Achieve Goals: Good  OT Frequency: Min 2X/week   Barriers to D/C:            Co-evaluation PT/OT/SLP Co-Evaluation/Treatment: Yes Reason for Co-Treatment: For patient/therapist safety;To address functional/ADL transfers;Necessary to address cognition/behavior during functional activity   OT goals addressed during session: ADL's and self-care      AM-PAC OT "6 Clicks" Daily Activity     Outcome Measure Help from another person eating meals?: A Little Help from another person taking care of personal grooming?: A Little Help from another person toileting, which includes using toliet, bedpan, or urinal?: A Lot Help from another person bathing (including washing, rinsing, drying)?: A Little Help from another person to put on and taking off regular upper body clothing?: A Little Help from another person to put on and taking off regular lower body clothing?: A Little 6 Click Score: 17   End of Session Equipment Utilized During  Treatment: Gait belt Nurse Communication: Mobility status  Activity Tolerance: Patient  tolerated treatment well Patient left: in chair;with call bell/phone within reach;with chair alarm set;with nursing/sitter in room  OT Visit Diagnosis: Other abnormalities of gait and mobility (R26.89);Muscle weakness (generalized) (M62.81);Other symptoms and signs involving cognitive function                Time: 7893-8101 OT Time Calculation (min): 31 min Charges:  OT General Charges $OT Visit: 1 Visit OT Evaluation $OT Eval Moderate Complexity: [place], OT Acute Rehabilitation Services Pager [phone] Office [phone]   Delight Stare 04/06/2019, 9:27 AM

## 2019-04-06 NOTE — Progress Notes (Signed)
Rehab Admissions Coordinator Note:  Per PT/OT recommendations, patient was screened by Michel Santee for appropriateness for an Inpatient Acute Rehab Consult.  Note pt already functioning at very high mobility level, and may not meet functional criteria for a CIR admit.  Will follow for one more therapy session to observe progress and await SLP cog eval.   Michel Santee 04/06/2019, 12:16 PM  I can be reached at [phone].

## 2019-04-06 NOTE — Progress Notes (Signed)
PROGRESS NOTE  Brandon Thomas YWV:[id number] DOB: 09/06/1975 DOA: 04/02/2019 PCP: Marcial Pacas, DO  Brief History   43 year old male with history of polysubstance abuse found unresponsive at home by wife, LSW 8 hrs earlier.  Suspected drug overdose.  Developed possible seizure like activity with EMS.  Required intubation in ER but since waking up following intermittent commands.  Found with suspected aspiration, AKI, rhabdo, hyperkalemia, transaminitis, and hypotension.  PCCM called for admit.  MRI was performed and demonstrated CVA involving the bilateral hippocampus.  EEG was performed and demonstrated diffuse encephalopathy, but no seizure activity. Doppler of the upper extremities was performed on 04/05/2019 which demonstrated superficial thrombosis of the basilic vein in the right upper extremity. He has grown Coagulase negative staph from 1/2 blood cultures obtained on 04/03/2019. Surveillance cultures were drawn on 04/05/2019. They have had no growth. The patient is receiving IV Vancomycin. CK is down to 12,600 from 27,261 on admission. LFT's have been elevated, but are resolving. Probably due to shock liver related to drug overdose.   Consultants  . Neurology  Procedures  . EEG . LP  Antibiotics   Anti-infectives (From admission, onward)   Start     Dose/Rate Route Frequency Ordered Stop   04/05/19 2330  vancomycin (VANCOCIN) IVPB 1000 mg/200 mL premix     1,000 mg 200 mL/hr over 60 Minutes Intravenous Every 12 hours 04/05/19 1326     04/05/19 1100  vancomycin (VANCOCIN) IVPB 1000 mg/200 mL premix     1,000 mg 200 mL/hr over 60 Minutes Intravenous  Once 04/05/19 1056 04/05/19 1246   04/04/19 1000  vancomycin (VANCOCIN) IVPB 1000 mg/200 mL premix  Status:  Discontinued     1,000 mg 200 mL/hr over 60 Minutes Intravenous Every 8 hours 04/04/19 0924 04/04/19 1015   04/04/19 0949  cefTRIAXone (ROCEPHIN) 2 g in sodium chloride 0.9 % 100 mL IVPB  Status:  Discontinued     2 g  200 mL/hr over 30 Minutes Intravenous Every 24 hours 04/03/19 1429 04/05/19 1039   04/04/19 0030  vancomycin (VANCOCIN) 2,000 mg in sodium chloride 0.9 % 500 mL IVPB     2,000 mg 250 mL/hr over 120 Minutes Intravenous  Once 04/04/19 0017 04/04/19 0308   04/03/19 0800  cefTRIAXone (ROCEPHIN) 1 g in sodium chloride 0.9 % 100 mL IVPB  Status:  Discontinued     1 g 200 mL/hr over 30 Minutes Intravenous Every 24 hours 04/03/19 0122 04/03/19 0759   04/03/19 0800  cefTRIAXone (ROCEPHIN) 2 g in sodium chloride 0.9 % 100 mL IVPB  Status:  Discontinued     2 g 200 mL/hr over 30 Minutes Intravenous Every 12 hours 04/03/19 0759 04/03/19 1429   04/03/19 0147  vancomycin variable dose per unstable renal function (pharmacist dosing)  Status:  Discontinued      Does not apply See admin instructions 04/03/19 0147 04/04/19 0926   04/03/19 0000  vancomycin (VANCOCIN) 2,500 mg in sodium chloride 0.9 % 500 mL IVPB     2,500 mg 250 mL/hr over 120 Minutes Intravenous  Once 04/02/19 2349 04/03/19 0255   04/03/19 0000  piperacillin-tazobactam (ZOSYN) IVPB 3.375 g     3.375 g 100 mL/hr over 30 Minutes Intravenous  Once 04/02/19 2349 04/03/19 0426    .  Subjective  The patient is sitting up at bedside. No new complaints.   Objective   Vitals:  Vitals:   04/06/19 1100 04/06/19 1145  BP: 121/84   Pulse: 66  Resp: 20   Temp:  98.4 F (36.9 C)  SpO2: 99%    Exam:  Constitutional:  . The patient is awake and alert. No acute distress. His wife states that his recent memory is still severely affected, although his long term memory is improved.  Respiratory:  . No increased work of breathing. . No wheezes, rales, or rhonchi . No tactile fremitus Cardiovascular:  . Regular rate and rhythm . No murmurs, ectopy, or gallups. . No lateral PMI. No thrills. Abdomen:  . Abdomen is soft, non-tender, non-distended . No hernias, masses, or organomegaly . Normoactive bowel sounds.  Musculoskeletal:  . No  cyanosis, clubbing, or edema Skin:  . No rashes, lesions, ulcers . palpation of skin: no induration or nodules Neurologic:  . CN 2-12 intact . Sensation all 4 extremities intact Psychiatric:  . Mental status o Mood, affect appropriate o Orientation to person, place I have personally reviewed the following:   Today's Data  . Vitals, CSF results, CMP, CBC, EEG, CK  Micro Data  . Blood cultures . CSF cultures  Imaging  . MRI brain . CT head . Doppler of upper extremities bilaterally  Scheduled Meds: . Chlorhexidine Gluconate Cloth  6 each Topical Q0600  . heparin  5,000 Units Subcutaneous Q8H  . pantoprazole (PROTONIX) IV  40 mg Intravenous Daily   Continuous Infusions: . sodium chloride Stopped (04/04/19 1307)  . dextrose 5% lactated ringers Stopped (04/06/19 1041)  . lactated ringers Stopped (04/06/19 1041)  . 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 Not known if he will meet criteria for inpatient rehab.  ?Possible for neurorestorative program in the area? Will ask social work to look into it.  Rhabdomyolysis from prolonged downtime: CK has declined from 27,600 to 12, 146. Increase IV fluids and add bicarb. Creatinine appears to have recovered at 1.06 down from 2.10 on admission.  Transaminitis and elevated troponins related to rhabdomyolysis/shock from overdose: Trending down. Monitor.  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 If patient is not a candidate for inpatient rehab consider local neuro-restorative program.  Staph aureus pneumonia: Likely aspiration. Continue Vancomycin.   Rt basilic vein superficial thrombosis: Avoid use of the right upper extremity for PICC. Warm compresses.   Coag negative staph grown from 1/2 blood cultures from 11/26./2020: Likely contaminant, but given the patient's history of IVDA surveillance cultures have been drawn. No growth from other culture from 04/03/2019 or surveillance culture from 04/05/2019. Echocardiogram did not reveal evidence of vegetations or wall motion abnormalities.   Troponin elevations: Likely troponin leak due to shock related to drug overdose and elevated creatinine. No wall motion abnormalities in echocardiogram.  Cellulitis of chest wall and RUE: Improving. IV vancomycin.  I have seen and evaluated this patient myself. I have spent 42 minutes in his evaluation and care. I have spent 50% or more of this time in counseling with the patient's wife. All questions answered to the best of my ability.  Abrielle Finck, DO [facility] Direct contact: see [email or web address]  7PM-7AM contact night coverage as above 04/06/2019, 12:42 PM  LOS: 3 days

## 2019-04-06 NOTE — Progress Notes (Signed)
Peripherally Inserted Central Catheter/Midline Placement  The IV Nurse has discussed with the patient and/or persons authorized to consent for the patient, the purpose of this procedure and the potential benefits and risks involved with this procedure.  The benefits include less needle sticks, lab draws from the catheter, and the patient may be discharged home with the catheter. Risks include, but not limited to, infection, bleeding, blood clot (thrombus formation), and puncture of an artery; nerve damage and irregular heartbeat and possibility to perform a PICC exchange if needed/ordered by physician.  Alternatives to this procedure were also discussed.  Bard Power PICC patient education guide, fact sheet on infection prevention and patient information card has been provided to patient /or left at bedside.  Consent signed by wife due to altered mental status.  PICC/Midline Placement Documentation  PICC Triple Lumen 04/06/19 PICC Left Brachial 46 cm 2 cm (Active)  Indication for Insertion or Continuance of Line Poor Vasculature-patient has had multiple peripheral attempts or PIVs lasting less than 24 hours 04/06/19 1505  Exposed Catheter (cm) 2 cm 04/06/19 1505  Site Assessment Clean;Dry;Intact 04/06/19 1505  Lumen #1 Status Flushed;Saline locked;Blood return noted 04/06/19 1505  Lumen #2 Status Flushed;Saline locked;Blood return noted 04/06/19 1505  Lumen #3 Status Flushed;Saline locked;Blood return noted 04/06/19 1505  Dressing Type Transparent 04/06/19 1505  Dressing Status Clean;Dry;Intact 04/06/19 1505  Dressing Intervention New dressing 04/06/19 1505  Dressing Change Due 04/13/19 04/06/19 1505       Solmon Bohr, Nicolette Bang 04/06/2019, 3:06 PM

## 2019-04-06 NOTE — Evaluation (Signed)
Physical Therapy Evaluation Patient Details Name: Brandon Thomas MRN: [id number] DOB: 20-Aug-1975 Today's Date: 04/06/2019   History of Present Illness  Pt is a 43 y/o male with hx of polysubstance abuse found unresponsive at home by wife, LSW 8 hrs earlier with suspected drug overdose. Developed possible seizure like activity with EMS. Found with suspected aspiration, AKI, rhabdo, hyperkalemia, transaminitis, and hypotension. 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 Intubated 11/25- 11/27. R UE superficial vein thrombosis found.  Clinical Impression  Pt presents to PT with decr mobility, balance, and cognitive deficits. Recommend CIR for further therapy since feel pt and family are going to need to learn strategies to compensate for pt's cognitive deficits to allow him to return home safely.     Follow Up Recommendations CIR    Equipment Recommendations  None recommended by PT    Recommendations for Other Services       Precautions / Restrictions Precautions Precautions: Fall Restrictions Weight Bearing Restrictions: No      Mobility  Bed Mobility Overal bed mobility: Needs Assistance Bed Mobility: Supine to Sit     Supine to sit: Min guard     General bed mobility comments: min guard for safety/balance  Transfers Overall transfer level: Needs assistance Equipment used: 1 person hand held assist Transfers: Sit to/from Stand Sit to Stand: Min assist         General transfer comment: assist to bring hips up and for balance  Ambulation/Gait Ambulation/Gait assistance: Min assist;+2 safety/equipment Gait Distance (Feet): 100 Feet Assistive device: 1 person hand held assist Gait Pattern/deviations: Step-through pattern;Decreased stride length;Wide base of support(incr lateral trunk sway) Gait velocity: decr Gait velocity interpretation: 1.31 - 2.62 ft/sec, indicative of limited community ambulator General Gait  Details: assist for balance and support. Foley cath discomfort may have contributed to pt's wide base  Stairs            Wheelchair Mobility    Modified Rankin (Stroke Patients Only)       Balance Overall balance assessment: Needs assistance Sitting-balance support: No upper extremity supported;Feet supported Sitting balance-Leahy Scale: Fair Sitting balance - Comments: able to don socks with min guard    Standing balance support: No upper extremity supported;Single extremity supported;During functional activity Standing balance-Leahy Scale: Poor Standing balance comment: UE support or min assist for static standing                             Pertinent Vitals/Pain Pain Assessment: No/denies pain    Home Living Family/patient expects to be discharged to:: Private residence Living Arrangements: Spouse/significant other;Children Available Help at Discharge: Family Type of Home: House Home Access: Stairs to enter Entrance Stairs-Rails: Right Entrance Stairs-Number of Steps: 2 Home Layout: Two level;Able to live on main level with bedroom/bathroom Home Equipment: None      Prior Function Level of Independence: Independent         Comments: works in [place]   Dominant Hand: Right    Extremity/Trunk Assessment   Upper Extremity Assessment Upper Extremity Assessment: Defer to OT evaluation RUE Deficits / Details: edema, limited functional use due to disomfort with movement  RUE Coordination: decreased fine motor;decreased gross motor LUE Deficits / Details: appears [facility]     Lower Extremity Assessment Lower Extremity Assessment: Generalized weakness    Cervical / Trunk Assessment Cervical / Trunk Assessment: Normal  Communication  Communication: No difficulties  Cognition Arousal/Alertness: Awake/alert Behavior During Therapy: Flat affect Overall Cognitive Status: Impaired/Different from baseline Area of  Impairment: Orientation;Attention;Following commands;Memory;Safety/judgement;Awareness;Problem solving                 Orientation Level: Disoriented to;Situation(able to report year, month with cueing ) Current Attention Level: Sustained Memory: Decreased recall of precautions;Decreased short-term memory Following Commands: Follows one step commands consistently;Follows one step commands with increased time;Follows multi-step commands inconsistently Safety/Judgement: Decreased awareness of safety;Decreased awareness of deficits Awareness: Intellectual Problem Solving: Slow processing;Decreased initiation;Difficulty sequencing;Requires verbal cues;Requires tactile cues General Comments: pt pleasant and cooperative, repetition of "how long have I been in here" and "I need to pee" during session; fair immediate recall of hoildays; increased time for processing but able to engage in habitual tasks with minimal cueing; labile but appropriately      General Comments General comments (skin integrity, edema, etc.): VSS    Exercises     Assessment/Plan    PT Assessment Patient needs continued PT services  PT Problem List Decreased strength;Decreased activity tolerance;Decreased balance;Decreased mobility;Decreased cognition;Decreased safety awareness;Decreased knowledge of precautions       PT Treatment Interventions Gait training;Stair training;Functional mobility training;Therapeutic activities;Therapeutic exercise;Balance training;Cognitive remediation;Patient/family education    PT Goals (Current goals can be found in the Care Plan section)  Acute Rehab PT Goals Patient Stated Goal: to get cleaned up  PT Goal Formulation: With patient Time For Goal Achievement: 04/20/19 Potential to Achieve Goals: Good    Frequency Min 3X/week   Barriers to discharge        Co-evaluation PT/OT/SLP Co-Evaluation/Treatment: Yes Reason for Co-Treatment: Necessary to address  cognition/behavior during functional activity;For patient/therapist safety PT goals addressed during session: Mobility/safety with mobility;Balance OT goals addressed during session: ADL's and self-care       AM-PAC PT "6 Clicks" Mobility  Outcome Measure Help needed turning from your back to your side while in a flat bed without using bedrails?: A Little Help needed moving from lying on your back to sitting on the side of a flat bed without using bedrails?: A Little Help needed moving to and from a bed to a chair (including a wheelchair)?: A Little Help needed standing up from a chair using your arms (e.g., wheelchair or bedside chair)?: A Little Help needed to walk in hospital room?: A Little Help needed climbing 3-5 steps with a railing? : A Little 6 Click Score: 18    End of Session Equipment Utilized During Treatment: Gait belt Activity Tolerance: Patient tolerated treatment well Patient left: in chair;with call bell/phone within reach;with chair alarm set Nurse Communication: Mobility status PT Visit Diagnosis: Unsteadiness on feet (R26.81);Other abnormalities of gait and mobility (R26.89);Muscle weakness (generalized) (M62.81)    Time: 7034-0352 PT Time Calculation (min) (ACUTE ONLY): 31 min   Charges:   PT Evaluation $PT Eval Moderate Complexity: [place] Pager [phone] Office [place] 04/06/2019, 10:33 AM

## 2019-04-06 NOTE — Progress Notes (Signed)
[facility] ADULT ICU REPLACEMENT PROTOCOL FOR AM LAB REPLACEMENT ONLY  The patient does apply for the [facility] Adult ICU Electrolyte Replacment Protocol based on the criteria listed below:   1. Is GFR >/= 40 ml/min? Yes.    Patient's GFR today is >60 2. Is urine output >/= 0.5 ml/kg/hr for the last 6 hours? Yes.   Patient's UOP is 0.9 ml/kg/hr 3. Is BUN < 60 mg/dL? Yes.    Patient's BUN today is 7 4. Abnormal electrolyte(s): k 3.3 5. Ordered repletion with: protocol 6. If a panic level lab has been reported, has the CCM MD in charge been notified? No..   Physician:    Ronda Fairly A 04/06/2019 6:22 AM

## 2019-04-07 DIAGNOSIS — R4189 Other symptoms and signs involving cognitive functions and awareness: Secondary | ICD-10-CM

## 2019-04-07 LAB — HEPATIC FUNCTION PANEL
ALT: 189 U/L — ABNORMAL HIGH (ref 0–44)
AST: 144 U/L — ABNORMAL HIGH (ref 15–41)
Albumin: 2.4 g/dL — ABNORMAL LOW (ref 3.5–5.0)
Alkaline Phosphatase: 38 U/L (ref 38–126)
Bilirubin, Direct: 0.2 mg/dL (ref 0.0–0.2)
Indirect Bilirubin: 0.6 mg/dL (ref 0.3–0.9)
Total Bilirubin: 0.8 mg/dL (ref 0.3–1.2)
Total Protein: 4.6 g/dL — ABNORMAL LOW (ref 6.5–8.1)

## 2019-04-07 LAB — BASIC METABOLIC PANEL
Anion gap: 9 (ref 5–15)
BUN: 6 mg/dL (ref 6–20)
CO2: 29 mmol/L (ref 22–32)
Calcium: 7.1 mg/dL — ABNORMAL LOW (ref 8.9–10.3)
Chloride: 105 mmol/L (ref 98–111)
Creatinine, Ser: 0.96 mg/dL (ref 0.61–1.24)
GFR calc Af Amer: >60 mL/min (ref >60)
GFR calc non Af Amer: >60 mL/min (ref >60)
Glucose, Bld: 83 mg/dL (ref 70–99)
Potassium: 3 mmol/L — ABNORMAL LOW (ref 3.5–5.1)
Sodium: 143 mmol/L (ref 135–145)

## 2019-04-07 LAB — CBC
HCT: 35.7 % — ABNORMAL LOW (ref 39.0–52.0)
Hemoglobin: 11.4 g/dL — ABNORMAL LOW (ref 13.0–17.0)
MCH: 28.9 pg (ref 26.0–34.0)
MCHC: 31.9 g/dL (ref 30.0–36.0)
MCV: 90.4 fL (ref 80.0–100.0)
Platelets: 263 10*3/uL (ref 150–400)
RBC: 3.95 MIL/uL — ABNORMAL LOW (ref 4.22–5.81)
RDW: 13.4 % (ref 11.5–15.5)
WBC: 7.1 10*3/uL (ref 4.0–10.5)
nRBC: 0 % (ref 0.0–0.2)

## 2019-04-07 LAB — CULTURE, RESPIRATORY W GRAM STAIN

## 2019-04-07 LAB — GLUCOSE, CAPILLARY
Glucose-Capillary: 100 mg/dL — ABNORMAL HIGH (ref 70–99)
Glucose-Capillary: 90 mg/dL (ref 70–99)
Glucose-Capillary: 96 mg/dL (ref 70–99)
Glucose-Capillary: 100 mg/dL — ABNORMAL HIGH (ref 70–99)
Glucose-Capillary: 88 mg/dL (ref 70–99)

## 2019-04-07 LAB — CK: Total CK: 4316 U/L — ABNORMAL HIGH (ref 49–397)

## 2019-04-07 MED ORDER — FUROSEMIDE 10 MG/ML IJ SOLN
20.0000 mg | Freq: Once | INTRAMUSCULAR | Status: AC
  Administered 2019-04-07: 20 mg via INTRAVENOUS
  Filled 2019-04-07: qty 2

## 2019-04-07 MED ORDER — TRAZODONE HCL 50 MG PO TABS
50.0000 mg | ORAL_TABLET | Freq: Once | ORAL | Status: AC
  Administered 2019-04-07: 50 mg via ORAL
  Filled 2019-04-07: qty 1

## 2019-04-07 MED ORDER — AMLODIPINE BESYLATE 2.5 MG PO TABS
2.5000 mg | ORAL_TABLET | Freq: Every day | ORAL | Status: DC
  Administered 2019-04-07 – 2019-04-09 (×3): 2.5 mg via ORAL
  Filled 2019-04-07 (×4): qty 1

## 2019-04-07 MED ORDER — POTASSIUM CHLORIDE CRYS ER 20 MEQ PO TBCR: 40.0000 meq | EXTENDED_RELEASE_TABLET | Freq: Once | ORAL | Status: AC

## 2019-04-07 MED ORDER — POTASSIUM CHLORIDE CRYS ER 20 MEQ PO TBCR
40.0000 meq | EXTENDED_RELEASE_TABLET | Freq: Once | ORAL | Status: AC
  Administered 2019-04-07: 40 meq via ORAL
  Filled 2019-04-07: qty 2

## 2019-04-07 MED ORDER — PNEUMOCOCCAL VAC POLYVALENT 25 MCG/0.5ML IJ INJ
0.5000 mL | INJECTION | INTRAMUSCULAR | Status: AC
  Administered 2019-04-09: 0.5 mL via INTRAMUSCULAR
  Filled 2019-04-07: qty 0.5

## 2019-04-07 MED ORDER — ASPIRIN EC 81 MG PO TBEC
81.0000 mg | DELAYED_RELEASE_TABLET | Freq: Every day | ORAL | Status: DC
  Administered 2019-04-07 – 2019-04-09 (×3): 81 mg via ORAL
  Filled 2019-04-07 (×4): qty 1

## 2019-04-07 MED ORDER — TRAMADOL HCL 50 MG PO TABS
50.0000 mg | ORAL_TABLET | Freq: Once | ORAL | Status: AC
  Administered 2019-04-07: 50 mg via ORAL
  Filled 2019-04-07: qty 1

## 2019-04-07 MED ORDER — CEFAZOLIN SODIUM-DEXTROSE 2-4 GM/100ML-% IV SOLN
2.0000 g | Freq: Three times a day (TID) | INTRAVENOUS | Status: DC
  Administered 2019-04-07 – 2019-04-09 (×7): 2 g via INTRAVENOUS
  Filled 2019-04-07 (×7): qty 100

## 2019-04-07 NOTE — Progress Notes (Addendum)
PROGRESS NOTE    KOLDEN DUPEE  YJE:[id number] DOB: 10/07/1975 DOA: 04/02/2019 PCP: Marcial Pacas, DO   Brief Narrative: 43 year old with PMH significant for polysubstance abuse found unresponsive at home by wife, LMSW 8 hours prior to admission.  Suspected drug overdose. Developed possible seizure like activity with EMS. Required intubation in ER but since waking up following intermittent commands. Found with suspected aspiration, AKI, rhabdo, hyperkalemia, transaminitis, and hypotension. PCCM called for admit. MRI was performed and demonstrated CVA involving the bilateral hippocampus.  EEG was performed and demonstrated diffuse encephalopathy, but no seizure activity. Doppler of the upper extremities was performed on 04/05/2019 which demonstrated superficial thrombosis of the basilic vein in the right upper extremity. He has grown Coagulase negative staph from 1/2 blood cultures obtained on 04/03/2019. Surveillance cultures were drawn on 04/05/2019. They have had no growth. The patient is receiving IV Vancomycin. CK is down to 12,600 from 27,261 on admission. LFT's have been elevated, but are resolving. Probably due to shock liver related to drug overdose.    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 He also has history of cocaine use.  CIR consulted.  PT OT. Evaluated by neurology, recommended suport care  2-Rhabdomyolysis; due to prolonged immobilization: CK level 27,000 trending down 12,000. Today at 4000.  Stop Bicarb Gtt.  Change NS to 125 cc per hour.   3-Transaminases and elevated troponin; rhabdomyolysis/shock from overdose  4-fentanyl induced hippocampal  Injury/stroke; continue to have short-term memory deficit. CIR evaluation  MSSA pneumonia: Likely aspiration.  Change vancomycin to Ancef.   Right basilic  vein superficial thrombosis: Start baby aspirin.  Warm compress Addendum; right arm more swollen, will decrease IV fluids. Will give IV lasix one dose and measure arm. Keep arm elevated   Coagulase negative staph 1 out of 2 blood cultures positive from 11/26 Likely a contaminant Repeated blood cultures for surveillance 11/28 no growth to date.  Echocardiogram negative for vegetation  Cellulitis of the chest wall and right upper extremity: Improving continue with IV antibiotics.  Hypertension: Blood pressure has been elevated restart low-dose Norvasc  Estimated body mass index is 33.15 kg/m as calculated from the following:   Height as of this encounter: 5' 10"  (1.778 m).   Weight as of this encounter: 104.8 kg.   DVT prophylaxis: Heparin Code Status: Full code Family Communication: Care discussed with patient Disposition Plan: Awaiting CIR evaluation Consultants:   Neurology  Procedures:   EEG  LP  Antimicrobials:    Subjective: He is alert, he is oriented to place and person.  He said that he was told why he is here in the hospital due to overdose.  Right arm continues to be swollen  Objective: Vitals:   04/07/19 0300 04/07/19 0400 04/07/19 0500 04/07/19 0600  BP: (!) 169/77 (!) 176/80 (!) 158/71 (!) 163/79  Pulse: 61 60 (!) 57 73  Resp: (!) 21 19 14 18   Temp:      TempSrc:      SpO2: 98% 95% 92% 95%  Weight:   104.8 kg   Height:        Intake/Output Summary (Last 24 hours) at 04/07/2019 0730 Last data filed at 04/07/2019 0600 Gross per 24 hour  Intake 3795.54 ml  Output 2960 ml  Net 835.54 ml   Filed Weights   04/05/19 0422 04/06/19 0500  04/07/19 0500  Weight: 102.8 kg 105.8 kg 104.8 kg    Examination:  General exam: Appears calm and comfortable  Respiratory system: Clear to auscultation. Respiratory effort normal. Cardiovascular system: S1 & S2 heard, RRR. No JVD, murmurs, rubs, gallops or clicks. No pedal edema. Gastrointestinal system: Abdomen  is nondistended, soft and nontender. No organomegaly or masses felt. Normal bowel sounds heard. Central nervous system: Alert and oriented. . Extremities: Symmetric 5 x 5 power. Right arm with edema, redness has improved.  Skin: No rashes, lesions or ulcers   Data Reviewed: I have personally reviewed following labs and imaging studies  CBC: Recent Labs  Lab 04/03/19 0323 04/04/19 0645 04/05/19 1105 04/06/19 0444 04/07/19 0528  WBC 14.1* 12.0* 10.1 9.4 7.1  NEUTROABS  --  7.5  --   --   --   HGB 14.3 13.2 12.3* 12.2* 11.4*  HCT 43.7 41.4 37.8* 38.0* 35.7*  MCV 89.9 92.4 89.8 90.5 90.4  PLT 309 332 293 284 119   Basic Metabolic Panel: Recent Labs  Lab 04/03/19 0214  04/03/19 1300 04/03/19 1849  04/04/19 0526  04/04/19 1250 04/04/19 1648 04/05/19 0337 04/05/19 1105 04/06/19 0444 04/07/19 0528  NA  --    < > 140  --   --  145  --   --   --   --  146* 144 143  K  --    < > 4.2 4.3   < > 3.6   < > 4.1 4.4  --  3.7 3.3* 3.0*  CL  --    < > 109  --   --  111  --   --   --   --  107 108 105  CO2  --    < > 22  --   --  19*  --   --   --   --  26 26 29   GLUCOSE  --    < > 101*  --   --  79  --   --   --   --  101* 100* 83  BUN  --    < > 25*  --   --  18  --   --   --   --  9 7 6   CREATININE  --    < > 1.95*  --   --  1.24  --   --   --   --  1.15 1.06 0.96  CALCIUM  --    < > 8.0*  --   --  8.3*  --   --   --   --  9.1 8.5* 7.1*  MG 3.2*  --   --  1.9  --  2.0  --   --  1.9 1.9  --   --   --   PHOS 11.0*  --   --  1.6*  --  1.1*  --   --  3.0 3.1  --   --   --    < > = values in this interval not displayed.   GFR: Estimated Creatinine Clearance: 119 mL/min (by C-G formula based on SCr of 0.96 mg/dL). Liver Function Tests: Recent Labs  Lab 04/03/19 0323 04/04/19 1034 04/05/19 0337 04/06/19 0444 04/07/19 0528  AST 276* 409* 491* 300* 144*  ALT 149* 224* 295* 276* 189*  ALKPHOS 47 41 41 43 38  BILITOT 0.8 0.7 0.7 0.8 0.8  PROT 5.3* 5.0* 5.7* 5.4* 4.6*  ALBUMIN 3.1*  3.0* 3.1* 2.7* 2.4*   No results for input(s): LIPASE, AMYLASE in the last 168 hours. Recent Labs  Lab 04/03/19 0323  AMMONIA 44*   Coagulation Profile: Recent Labs  Lab 04/02/19 2201 04/03/19 0323 04/05/19 0337  INR 1.1 1.1 1.1   Cardiac Enzymes: Recent Labs  Lab 04/03/19 0323 04/03/19 0605 04/04/19 1034 04/06/19 0444 04/07/19 0528  CKTOTAL 27,261* 49,675* 24,818* 12,146* 4,316*  CKMB  --   --  74.3*  --   --    BNP (last 3 results) No results for input(s): PROBNP in the last 8760 hours. HbA1C: Recent Labs    04/04/19 1722  HGBA1C 5.1   CBG: Recent Labs  Lab 04/06/19 0035 04/06/19 0502 04/06/19 0827 04/06/19 1144 04/06/19 1546  GLUCAP 94 94 86 92 107*   Lipid Profile: No results for input(s): CHOL, HDL, LDLCALC, TRIG, CHOLHDL, LDLDIRECT in the last 72 hours. Thyroid Function Tests: No results for input(s): TSH, T4TOTAL, FREET4, T3FREE, THYROIDAB in the last 72 hours. Anemia Panel: No results for input(s): VITAMINB12, FOLATE, FERRITIN, TIBC, IRON, RETICCTPCT in the last 72 hours. Sepsis Labs: Recent Labs  Lab 04/02/19 2109 04/03/19 0013 04/03/19 0143 04/03/19 0254 04/04/19 1034  PROCALCITON  --   --  14.02  --   --   LATICACIDVEN 8.8* 4.1*  --  5.2* 1.1    Recent Results (from the past 240 hour(s))  Blood Culture (routine x 2)     Status: Abnormal   Collection Time: 04/02/19  9:00 PM   Specimen: BLOOD  Result Value Ref Range Status   Specimen Description BLOOD RIGHT ANTECUBITAL  Final   Special Requests   Final    BOTTLES DRAWN AEROBIC AND ANAEROBIC Blood Culture adequate volume   Culture  Setup Time   Final    AEROBIC BOTTLE ONLY GRAM POSITIVE COCCI CRITICAL RESULT CALLED TO, READ BACK BY AND VERIFIED WITH: L CURRAN PHARMD 04/03/19 2102 JDW    Culture (A)  Final    STAPHYLOCOCCUS SPECIES (COAGULASE NEGATIVE) THE SIGNIFICANCE OF ISOLATING THIS ORGANISM FROM A SINGLE SET OF BLOOD CULTURES WHEN MULTIPLE SETS ARE DRAWN IS UNCERTAIN. PLEASE  NOTIFY THE MICROBIOLOGY DEPARTMENT WITHIN ONE WEEK IF SPECIATION AND SENSITIVITIES ARE REQUIRED. Performed at [facility], [place] [address] [place], [place] [address]    Report Status 04/04/2019 FINAL  Final  SARS Coronavirus 2 by RT PCR (hospital order, performed in [facility] hospital lab) Nasopharyngeal Nasopharyngeal Swab     Status: None   Collection Time: 04/02/19 10:37 PM   Specimen: Nasopharyngeal Swab  Result Value Ref Range Status   SARS Coronavirus 2 NEGATIVE NEGATIVE Final    Comment: (NOTE) SARS-CoV-2 target nucleic acids are NOT DETECTED. The SARS-CoV-2 RNA is generally detectable in upper and lower respiratory specimens during the acute phase of infection. The lowest concentration of SARS-CoV-2 viral copies this assay can detect is 250 copies / mL. A negative result does not preclude SARS-CoV-2 infection and should not be used as the sole basis for treatment or other patient management decisions.  A negative result may occur with improper specimen collection / handling, submission of specimen other than nasopharyngeal swab, presence of viral mutation(s) within the areas targeted by this assay, and inadequate number of viral copies (<250 copies / mL). A negative result must be combined with clinical observations, patient history, and epidemiological information. Fact Sheet for Patients:   StrictlyIdeas.no Fact Sheet for Healthcare Providers: BankingDealers.co.za This test is not yet approved or cleared  by the  [place] Architectural technologist and has been authorized for detection and/or diagnosis of SARS-CoV-2 by FDA under an Print production planner (EUA).  This EUA will remain in effect (meaning this test can be used) for the duration of the COVID-19 declaration under Section 564(b)(1) of the Act, 21 U.S.C. section 360bbb-3(b)(1), unless the authorization is terminated or revoked sooner. Performed at [facility],  [place] [address] [place], [place] [address]   Blood Culture (routine x 2)     Status: None (Preliminary result)   Collection Time: 04/03/19 12:13 AM   Specimen: BLOOD RIGHT FOREARM  Result Value Ref Range Status   Specimen Description BLOOD RIGHT FOREARM  Final   Special Requests   Final    BOTTLES DRAWN AEROBIC AND ANAEROBIC Blood Culture adequate volume   Culture   Final    NO GROWTH 3 DAYS Performed at [facility], [place] [address], [place], [place] [address]    Report Status PENDING  Incomplete  MRSA PCR Screening     Status: None   Collection Time: 04/03/19  5:36 AM  Result Value Ref Range Status   MRSA by PCR NEGATIVE NEGATIVE Final    Comment:        The GeneXpert MRSA Assay (FDA approved for NASAL specimens only), is one component of a comprehensive MRSA colonization surveillance program. It is not intended to diagnose MRSA infection nor to guide or monitor treatment for MRSA infections. Performed at [facility], [place] [address], [place], [place] [address]   Culture, respiratory (tracheal aspirate)     Status: None (Preliminary result)   Collection Time: 04/03/19  8:07 AM   Specimen: Tracheal Aspirate; Respiratory  Result Value Ref Range Status   Specimen Description TRACHEAL ASPIRATE  Final   Special Requests NONE  Final   Gram Stain   Final    RARE WBC PRESENT,BOTH PMN AND MONONUCLEAR RARE GRAM POSITIVE COCCI IN PAIRS    Culture   Final    FEW STAPHYLOCOCCUS AUREUS SUSCEPTIBILITIES TO FOLLOW Performed at [facility], [place] [address] [place], [place] [address]    Report Status PENDING  Incomplete  CSF culture     Status: None   Collection Time: 04/03/19 12:15 PM   Specimen: CSF; Cerebrospinal Fluid  Result Value Ref Range Status   Specimen Description CSF  Final   Special Requests NONE  Final   Gram Stain   Final    WBC PRESENT, PREDOMINANTLY MONONUCLEAR NO ORGANISMS SEEN CYTOSPIN SMEAR    Culture   Final    NO GROWTH 3 DAYS Performed  at [facility], [place] [address] [place], [place] [address]    Report Status 04/06/2019 FINAL  Final  Culture, blood (routine x 2)     Status: None (Preliminary result)   Collection Time: 04/05/19 11:05 AM   Specimen: BLOOD LEFT HAND  Result Value Ref Range Status   Specimen Description BLOOD LEFT HAND  Final   Special Requests   Final    BOTTLES DRAWN AEROBIC AND ANAEROBIC Blood Culture adequate volume   Culture   Final    NO GROWTH < 24 HOURS Performed at [facility], [place] [address] [place], [place] [address]    Report Status PENDING  Incomplete  Culture, blood (routine x 2)     Status: None (Preliminary result)   Collection Time: 04/05/19 12:45 PM   Specimen: BLOOD LEFT HAND  Result Value Ref Range Status   Specimen  Description BLOOD LEFT HAND  Final   Special Requests   Final    BOTTLES DRAWN AEROBIC ONLY Blood Culture adequate volume   Culture   Final    NO GROWTH < 24 HOURS Performed at [facility], [address] [place], [place] [address]    Report Status PENDING  Incomplete         Radiology Studies: Vas [place] Upper Extremity Venous Duplex  Result Date: 04/06/2019 UPPER VENOUS STUDY  Indications: Swelling, and Erythema Risk Factors: Suspected overdose, patient down for at least 8 hours, rhabdomyolysis. Comparison Study: No prior study on file for comparison Performing Technologist: Sharion Dove RVS  Examination Guidelines: A complete evaluation includes B-mode imaging, spectral Doppler, color Doppler, and power Doppler as needed of all accessible portions of each vessel. Bilateral testing is considered an integral part of a complete examination. Limited examinations for reoccurring indications may be performed as noted.  Right Findings: +----------+------------+---------+-----------+----------+---------------------+ RIGHT     CompressiblePhasicitySpontaneousProperties       Summary         +----------+------------+---------+-----------+----------+---------------------+ IJV           Full       Yes       Yes                                    +----------+------------+---------+-----------+----------+---------------------+ Subclavian               Yes       Yes                                    +----------+------------+---------+-----------+----------+---------------------+ Axillary                 Yes       Yes                                    +----------+------------+---------+-----------+----------+---------------------+ Brachial      Full       Yes       Yes                                    +----------+------------+---------+-----------+----------+---------------------+ Radial        Full                                                        +----------+------------+---------+-----------+----------+---------------------+ Ulnar                                                  Not visualized     +----------+------------+---------+-----------+----------+---------------------+ Cephalic      Full                                                        +----------+------------+---------+-----------+----------+---------------------+  Basilic       None                                   Acute, localized to                                                             [facility] only        +----------+------------+---------+-----------+----------+---------------------+  Left Findings: +----------+------------+---------+-----------+----------+-------+ LEFT      CompressiblePhasicitySpontaneousPropertiesSummary +----------+------------+---------+-----------+----------+-------+ Subclavian               Yes       Yes                      +----------+------------+---------+-----------+----------+-------+  Summary:  Right: No evidence of deep vein thrombosis in the upper extremity. Findings consistent with acute superficial vein thrombosis  involving the right basilic vein.  Left: No evidence of thrombosis in the subclavian.  *See table(s) above for measurements and observations.  Diagnosing physician: Curt Jews MD Electronically signed by Curt Jews MD on 04/06/2019 at 37:48:16 AM.    Final    [place] Ekg Site Rite  Result Date: 04/06/2019 If Site Rite image not attached, placement could not be confirmed due to current cardiac rhythm.       Scheduled Meds: . Chlorhexidine Gluconate Cloth  6 each Topical Q0600  . heparin  5,000 Units Subcutaneous Q8H  . pantoprazole (PROTONIX) IV  40 mg Intravenous Daily  . sodium chloride flush  10-40 mL Intracatheter Q12H   Continuous Infusions: . sodium chloride Stopped (04/07/19 0544)  . sodium chloride 125 mL/hr at 04/07/19 0600  .  sodium bicarbonate infusion 1/4 NS 1000 mL 75 mL/hr at 04/07/19 0600  . vancomycin 200 mL/hr at 04/07/19 0600     LOS: 4 days    Time spent: 35 minutes.     Elmarie Shiley, MD [facility]   If 7PM-7AM, please contact night-coverage [email or web address] Password [email or web address] 04/07/2019, 7:30 AM

## 2019-04-07 NOTE — Progress Notes (Signed)
Patient and wife concerned about right arm swelling. Dr. Tyrell Antonio notified patient's concern, elevated SBP, and patient's request for pain med or something to relax .Reviewed plan of care, to include IV antibiotics to continue. One time dose of IV lasix and p.o.Tramadol ordered.

## 2019-04-07 NOTE — Progress Notes (Signed)
Inpatient Rehabilitation-Admissions Coordinator   Met with pt and his wife at the bedside as follow up from PM&R MD consult. Discussed program details, expectations of IP Rehab, and anticipated assist level at DC. Pt and his wife are very interested in the program if insurance approves. Will begin insurance authorization process for possible admit.   Please call if questions.   Jhonnie Garner, OTR/L  Rehab Admissions Coordinator  [phone] 04/07/2019 6:13 PM

## 2019-04-07 NOTE — Progress Notes (Signed)
Pharmacy Antibiotic Note  Brandon Thomas is a 43 y.o. male admitted on 04/02/2019 after being found unresponsive. Now with MSSA pneumonia.  Pharmacy has been consulted for Cefazolin dosing. Vancomycin has been discontinued.   WBC is improving. Last dose of Ceftriaxone was 11/28. SCr is improving at 0.96 and CK is decreasing.  Patient is afebrile.   Plan: Ancef 2g IV every 8 hours.  Monitor renal function, culture results, and clinical status.   Height: 5' 10"  (177.8 cm) Weight: 231 lb 0.7 oz (104.8 kg) IBW/kg (Calculated) : 73  Temp (24hrs), Avg:98.3 F (36.8 C), Min:98.1 F (36.7 C), Max:98.4 F (36.9 C)  Recent Labs  Lab 04/02/19 2109  04/03/19 0013 04/03/19 0254 04/03/19 0323  04/03/19 1300 04/03/19 2333 04/04/19 0526 04/04/19 0645 04/04/19 1034 04/05/19 1105 04/06/19 0444 04/07/19 0528  WBC 22.5*  --   --   --  14.1*  --   --   --   --  12.0*  --  10.1 9.4 7.1  CREATININE 2.55*   < >  --   --  2.09*   < > 1.95*  --  1.24  --   --  1.15 1.06 0.96  LATICACIDVEN 8.8*  --  4.1* 5.2*  --   --   --   --   --   --  1.1  --   --   --   VANCORANDOM  --   --   --   --   --   --   --  7  --   --   --   --   --   --    < > = values in this interval not displayed.    Estimated Creatinine Clearance: 119 mL/min (by C-G formula based on SCr of 0.96 mg/dL).    Allergies  Allergen Reactions  . Atorvastatin     RUQ pain  . Crestor [Rosuvastatin]     Elevated LFTs    Antimicrobials this admission: Vanc 11/26>> 11/27; restart 11/28 >> Zosyn 11/26x1 Ceftriaxone 11/26>>11/28 Cefazolin 11/30 >>  Dose adjustments this admission:   Microbiology results: 11/25 covid neg 11/25 blood x 2 >> 1/2 CoNS 11/26 resp >> MSSA 11/26 CSF cx >> ngx2d 11/26 MRSA PCR >> neg 11/28 BCx >> ngtd  Thank you for allowing pharmacy to be a part of this patient's care.  Brain Hilts 04/07/2019 9:45 AM

## 2019-04-07 NOTE — Consult Note (Signed)
Physical Medicine and Rehabilitation Consult   Reason for Consult: Encephalopathy due to fentanyl overdose Referring Physician: Dr. Tyrell Antonio   HPI: Brandon Thomas is a 43 y.o. male with history of depression, polysubstance abuse (completed drug rehab but family reported recent behaviors consistent with relapse), hypogonadism (self treats with IM steroids) who was admitted on 04/03/19 found unresponsive with gurgling sounds by wife and CPR initiated. Patient treated with narcan without response and had decerebrate posturing with concerns of prolonged seizure with CK 9495, WBC- 22.5, shocked liver, was febrile with T-101.5, had lactic acidosis and UDS positive for benzo's. He was intubated for airway protection, started on broad spectrum antibiotics for sepsis due to aspiration PNA and/or RUE cellulitis, IVF for rhabdomyolysis and hypotension. 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 Blood cultures negative. CSF negative for organisms and no growth. EEG showed severe diffuse encephalopathy. RUE dopplers with acute superficial thrombosis of right basilar vein. He tolerated extubation the next day and respiratory status stable Dr. Leonel Ramsay felt that work up pointed to fentanyl overdose and no further work up indicated. Therapy evaluations completed yesterday revealing balance and cognitive deficits. Nursing reports that he sat up in a chair for most of the shift yesterday.  CIR recommended due to functional deficits.     Review of Systems  Constitutional: Negative for chills and fever.  HENT: Negative for hearing loss and tinnitus.   Eyes: Negative for blurred vision and double vision.  Respiratory: Negative for cough and shortness of breath.   Cardiovascular: Negative for chest pain and palpitations.       RUE edema with pain/numbness.   Gastrointestinal: Negative  for abdominal pain, heartburn and nausea.       Tongue soreness  Musculoskeletal: Negative for back pain and joint pain.  Neurological: Negative for dizziness, speech change, weakness and headaches.  Psychiatric/Behavioral: Positive for memory loss.     Past Medical History:  Diagnosis Date  . Depression    Well treated without meds currently  . Elevated LFTs 08/20/2013  . Hyperlipidemia 08/20/2013    Past Surgical History:  Procedure Laterality Date  . WISDOM TOOTH EXTRACTION      Family History  Adopted: Yes    Social History:  Married--has two young daughters at home. Works as a Counselling psychologist. He  reports that he has never smoked. His smokeless tobacco use includes chew-- a can daily. He reports current alcohol use of about 1.0 standard drinks of alcohol per week. He reports that he uses opiates--"whatever he can get--injects it"     Allergies  Allergen Reactions  . Atorvastatin     RUQ pain  . Crestor [Rosuvastatin]     Elevated LFTs    Medications Prior to Admission  Medication Sig Dispense Refill  . cetirizine (ZYRTEC) 10 MG tablet Take 10 mg by mouth daily.    Marland Kitchen oxymetazoline (AFRIN) 0.05 % nasal spray Place 1 spray into both nostrils 2 (two) times daily as needed for congestion.    . sertraline (ZOLOFT) 100 MG tablet Take 100 mg by mouth daily.    . sodium chloride (OCEAN) 0.65 % SOLN nasal spray Place 1 spray into both nostrils as needed for congestion.    Marland Kitchen tetrahydrozoline-zinc (VISINE-AC) 0.05-0.25 % ophthalmic solution Place 2 drops into both eyes daily as needed (dry eyes).    Marland Kitchen diltiazem 2 % GEL Apply 1 application  topically 5 (five) times daily. (Patient not taking: Reported on 04/03/2019) 30 g 6  . Na Sulfate-K Sulfate-Mg Sulf SOLN Take 1 kit by mouth once. (Patient not taking: Reported on 04/03/2019) 354 mL 0  . NEEDLE, DISP, 22 G 22G X 1-1/2" MISC Inject into skin every 14 days. 20 each prn  . pravastatin (PRAVACHOL) 20 MG tablet Take  1 tablet (20 mg total) by mouth daily. (Patient not taking: Reported on 04/03/2019) 90 tablet 1  . Syringe, Disposable, 3 ML MISC Inject into skin every 14 days. 20 each prn  . testosterone cypionate (DEPOTESTOSTERONE CYPIONATE) 200 MG/ML injection Inject 1 mL (200 mg total) into the muscle every 14 (fourteen) days. (Patient not taking: Reported on 04/03/2019) 4 mL 0    Home: Home Living Family/patient expects to be discharged to:: Private residence Living Arrangements: Spouse/significant other, Children Available Help at Discharge: Family Type of Home: House Home Access: Stairs to enter Technical brewer of Steps: 2 Entrance Stairs-Rails: Right Home Layout: Two level, Able to live on main level with bedroom/bathroom Bathroom Shower/Tub: Walk-in shower Home Equipment: None  Functional History: Prior Function Level of Independence: Independent Comments: works in Economist Status:  Mobility: Bed Mobility Overal bed mobility: Needs Assistance Bed Mobility: Supine to Sit Supine to sit: Min guard General bed mobility comments: min guard for safety/balance Transfers Overall transfer level: Needs assistance Equipment used: 1 person hand held assist Transfers: Sit to/from Stand Sit to Stand: Min assist General transfer comment: assist to bring hips up and for balance Ambulation/Gait Ambulation/Gait assistance: Min assist, +2 safety/equipment Gait Distance (Feet): 100 Feet Assistive device: 1 person hand held assist Gait Pattern/deviations: Step-through pattern, Decreased stride length, Wide base of support(incr lateral trunk sway) General Gait Details: assist for balance and support. Foley cath discomfort may have contributed to pt's wide base Gait velocity: decr Gait velocity interpretation: 1.31 - 2.62 ft/sec, indicative of limited community ambulator    ADL: ADL Overall ADL's : Needs assistance/impaired Grooming: Minimal assistance, Standing  Upper Body Bathing: Minimal assistance, Sitting Lower Body Bathing: Minimal assistance, Sit to/from stand Upper Body Dressing : Sitting, Minimal assistance Lower Body Dressing: Minimal assistance, Sit to/from stand Toilet Transfer: Minimal assistance, Ambulation Toilet Transfer Details (indicate cue type and reason): simulated in room Functional mobility during ADLs: Minimal assistance, Cueing for safety General ADL Comments: pt limited by impaired balance, decreased activity tolerance, generalized weakness and impaired cognition   Cognition: Cognition Overall Cognitive Status: Impaired/Different from baseline Orientation Level: Oriented X4 Cognition Arousal/Alertness: Awake/alert Behavior During Therapy: Flat affect Overall Cognitive Status: Impaired/Different from baseline Area of Impairment: Orientation, Attention, Following commands, Memory, Safety/judgement, Awareness, Problem solving Orientation Level: Disoriented to, Situation(able to report year, month with cueing ) Current Attention Level: Sustained Memory: Decreased recall of precautions, Decreased short-term memory Following Commands: Follows one step commands consistently, Follows one step commands with increased time, Follows multi-step commands inconsistently Safety/Judgement: Decreased awareness of safety, Decreased awareness of deficits Awareness: Intellectual Problem Solving: Slow processing, Decreased initiation, Difficulty sequencing, Requires verbal cues, Requires tactile cues General Comments: pt pleasant and cooperative, repetition of "how long have I been in here" and "I need to pee" during session; fair immediate recall of hoildays; increased time for processing but able to engage in habitual tasks with minimal cueing; labile but appropriately   Blood pressure (!) 156/62, pulse 74, temperature 98.4 F (36.9 C), temperature source Oral, resp. rate 18, height 5' 10" (1.778 m), weight 104.8 kg, SpO2 96 %.  Physical Exam  Nursing note and vitals reviewed. Constitutional: He appears well-developed and well-nourished. No distress.  HENT:  Head: Normocephalic and atraumatic.  Mouth/Throat: Oropharynx is clear and moist.  Eyes: Pupils are equal, round, and reactive to light. Conjunctivae are normal.  Respiratory: No respiratory distress.  GI: He exhibits no distension. There is no abdominal tenderness.  Musculoskeletal:     Comments: RUE--tight with 2+ edema and foam dressing on biceps.   Strength appears intact throughout with 5/5 strength on testing with the exception of his right upper extremity which has limited hand grip (3/5) and EE, WF, and EF (4/5) Neurological: He is alert. AOx2. Unable to state date.  Speech clear. Has amnesia of events leading to admission. He was able to utilize calender for date independently and answer most basic orientation questions without difficulty. Able to follow 2 step commands.   Skin: He is not diaphoretic.  Psychiatric: Depressed, tearful about the impact his illness is having on his family.  Results for orders placed or performed during the hospital encounter of 04/02/19 (from the past 24 hour(s))  Glucose, capillary     Status: None   Collection Time: 04/06/19 11:44 AM  Result Value Ref Range   Glucose-Capillary 92 70 - 99 mg/dL  Glucose, capillary     Status: Abnormal   Collection Time: 04/06/19  3:46 PM  Result Value Ref Range   Glucose-Capillary 107 (H) 70 - 99 mg/dL  Hepatic function panel     Status: Abnormal   Collection Time: 04/07/19  5:28 AM  Result Value Ref Range   Total Protein 4.6 (L) 6.5 - 8.1 g/dL   Albumin 2.4 (L) 3.5 - 5.0 g/dL   AST 144 (H) 15 - 41 U/L   ALT 189 (H) 0 - 44 U/L   Alkaline Phosphatase 38 38 - 126 U/L   Total Bilirubin 0.8 0.3 - 1.2 mg/dL   Bilirubin, Direct 0.2 0.0 - 0.2 mg/dL   Indirect Bilirubin 0.6 0.3 - 0.9 mg/dL  Basic metabolic panel     Status: Abnormal   Collection Time: 04/07/19  5:28 AM  Result  Value Ref Range   Sodium 143 135 - 145 mmol/L   Potassium 3.0 (L) 3.5 - 5.1 mmol/L   Chloride 105 98 - 111 mmol/L   CO2 29 22 - 32 mmol/L   Glucose, Bld 83 70 - 99 mg/dL   BUN 6 6 - 20 mg/dL   Creatinine, Ser 0.96 0.61 - 1.24 mg/dL   Calcium 7.1 (L) 8.9 - 10.3 mg/dL   GFR calc non Af Amer >60 >60 mL/min   GFR calc Af Amer >60 >60 mL/min   Anion gap 9 5 - 15  CBC     Status: Abnormal   Collection Time: 04/07/19  5:28 AM  Result Value Ref Range   WBC 7.1 4.0 - 10.5 K/uL   RBC 3.95 (L) 4.22 - 5.81 MIL/uL   Hemoglobin 11.4 (L) 13.0 - 17.0 g/dL   HCT 35.7 (L) 39.0 - 52.0 %   MCV 90.4 80.0 - 100.0 fL   MCH 28.9 26.0 - 34.0 pg   MCHC 31.9 30.0 - 36.0 g/dL   RDW 13.4 11.5 - 15.5 %   Platelets 263 150 - 400 K/uL   nRBC 0.0 0.0 - 0.2 %  CK     Status: Abnormal   Collection Time: 04/07/19  5:28 AM  Result Value Ref Range   Total CK 4,316 (H) 49 - 397 U/L  Glucose, capillary  Status: Abnormal   Collection Time: 04/07/19  7:44 AM  Result Value Ref Range   Glucose-Capillary 100 (H) 70 - 99 mg/dL   Vas [place] Upper Extremity Venous Duplex  Result Date: 04/06/2019 UPPER VENOUS STUDY  Indications: Swelling, and Erythema Risk Factors: Suspected overdose, patient down for at least 8 hours, rhabdomyolysis. Comparison Study: No prior study on file for comparison Performing Technologist: Sharion Dove RVS  Examination Guidelines: A complete evaluation includes B-mode imaging, spectral Doppler, color Doppler, and power Doppler as needed of all accessible portions of each vessel. Bilateral testing is considered an integral part of a complete examination. Limited examinations for reoccurring indications may be performed as noted.  Right Findings: +----------+------------+---------+-----------+----------+---------------------+ RIGHT     CompressiblePhasicitySpontaneousProperties       Summary        +----------+------------+---------+-----------+----------+---------------------+ IJV            Full       Yes       Yes                                    +----------+------------+---------+-----------+----------+---------------------+ Subclavian               Yes       Yes                                    +----------+------------+---------+-----------+----------+---------------------+ Axillary                 Yes       Yes                                    +----------+------------+---------+-----------+----------+---------------------+ Brachial      Full       Yes       Yes                                    +----------+------------+---------+-----------+----------+---------------------+ Radial        Full                                                        +----------+------------+---------+-----------+----------+---------------------+ Ulnar                                                  Not visualized     +----------+------------+---------+-----------+----------+---------------------+ Cephalic      Full                                                        +----------+------------+---------+-----------+----------+---------------------+ Basilic       None  Acute, localized to                                                             [facility] only        +----------+------------+---------+-----------+----------+---------------------+  Left Findings: +----------+------------+---------+-----------+----------+-------+ LEFT      CompressiblePhasicitySpontaneousPropertiesSummary +----------+------------+---------+-----------+----------+-------+ Subclavian               Yes       Yes                      +----------+------------+---------+-----------+----------+-------+  Summary:  Right: No evidence of deep vein thrombosis in the upper extremity. Findings consistent with acute superficial vein thrombosis involving the right basilic vein.  Left: No evidence of thrombosis in the subclavian.  *See table(s)  above for measurements and observations.  Diagnosing physician: Curt Jews MD Electronically signed by Curt Jews MD on 04/06/2019 at 70:48:16 AM.    Final    [place] Ekg Site Rite  Result Date: 04/06/2019 If Site Rite image not attached, placement could not be confirmed due to current cardiac rhythm.    Assessment/Plan: Diagnosis: Impaired mobility and ADLs following fentanyl overdose 1. Does the need for close, 24 hr/day medical supervision in concert with the patient's rehab needs make it unreasonable for this patient to be served in a less intensive setting? Yes 2. Co-Morbidities requiring supervision/potential complications: depression, polysubstance abuse, hypogonadism  3. Due to safety, disease management, medication administration and patient education, does the patient require 24 hr/day rehab nursing? Yes 4. Does the patient require coordinated care of a physician, rehab nurse, therapy disciplines of PT, OT, SLP to address physical and functional deficits in the context of the above medical diagnosis(es)? Yes Addressing deficits in the following areas: balance, endurance, locomotion, strength, bathing, dressing, feeding, grooming, cognition and psychosocial support 5. Can the patient actively participate in an intensive therapy program of at least 3 hrs of therapy per day at least 5 days per week? Yes 6. The potential for patient to make measurable gains while on inpatient rehab is excellent 7. Anticipated functional outcomes upon discharge from inpatient rehab are modified independent  with PT, modified independent with OT, independent with SLP. 8. Estimated rehab length of stay to reach the above functional goals is: 10-14 days 9. Anticipated discharge destination: Home 10. Overall Rehab/Functional Prognosis: excellent  RECOMMENDATIONS: This patient's condition is appropriate for continued rehabilitative care in the following setting: CIR Patient has agreed to participate in recommended  program. Yes Note that insurance prior authorization may be required for reimbursement for recommended care.  Comment: Mr. Luten has impaired mobility, balance, right upper extremity strength, and cognitive deficits and would benefit from inpatient rehabilitation to maximize his safety and independence at home. He would be able to tolerate 3 hours of daily therapy.   Bary Leriche, PA-C 04/07/2019   I have personally performed a face to face diagnostic evaluation, including, but not limited to relevant history and physical exam findings, of this patient and developed relevant assessment and plan.  Additionally, I have reviewed and concur with the physician assistant's documentation above.  Leeroy Cha, MD

## 2019-04-07 NOTE — Progress Notes (Signed)
Attempted to call report to 6N. Secretary took number and said a nurse will call back.

## 2019-04-07 NOTE — Progress Notes (Signed)
Patient transferred to [facility] room 12. IVF infusing via left PICC. Right arm swelling noted, as per report. Skin taut. Foam dressing to right antecubital area. Skin underneath with blisters. Patient awake and alert. Some memory impairment. Easily reoriented. Transferred from wheelchair to bed with one person assistance. Call bell in reach. Wife at bedside.

## 2019-04-08 ENCOUNTER — Inpatient Hospital Stay (HOSPITAL_COMMUNITY): Payer: BLUE CROSS/BLUE SHIELD

## 2019-04-08 DIAGNOSIS — M7989 Other specified soft tissue disorders: Secondary | ICD-10-CM

## 2019-04-08 DIAGNOSIS — R509 Fever, unspecified: Secondary | ICD-10-CM

## 2019-04-08 DIAGNOSIS — T50901A Poisoning by unspecified drugs, medicaments and biological substances, accidental (unintentional), initial encounter: Secondary | ICD-10-CM | POA: Diagnosis present

## 2019-04-08 LAB — GLUCOSE, CAPILLARY
Glucose-Capillary: 104 mg/dL — ABNORMAL HIGH (ref 70–99)
Glucose-Capillary: 106 mg/dL — ABNORMAL HIGH (ref 70–99)
Glucose-Capillary: 87 mg/dL (ref 70–99)
Glucose-Capillary: 94 mg/dL (ref 70–99)
Glucose-Capillary: 96 mg/dL (ref 70–99)
Glucose-Capillary: 97 mg/dL (ref 70–99)

## 2019-04-08 LAB — CBC
HCT: 41.1 % (ref 39.0–52.0)
Hemoglobin: 13.2 g/dL (ref 13.0–17.0)
MCH: 28.9 pg (ref 26.0–34.0)
MCHC: 32.1 g/dL (ref 30.0–36.0)
MCV: 89.9 fL (ref 80.0–100.0)
Platelets: 329 10*3/uL (ref 150–400)
RBC: 4.57 MIL/uL (ref 4.22–5.81)
RDW: 13.6 % (ref 11.5–15.5)
WBC: 9.4 10*3/uL (ref 4.0–10.5)
nRBC: 0 % (ref 0.0–0.2)

## 2019-04-08 LAB — HEPATIC FUNCTION PANEL
ALT: 207 U/L — ABNORMAL HIGH (ref 0–44)
AST: 135 U/L — ABNORMAL HIGH (ref 15–41)
Albumin: 3.1 g/dL — ABNORMAL LOW (ref 3.5–5.0)
Alkaline Phosphatase: 47 U/L (ref 38–126)
Bilirubin, Direct: 0.2 mg/dL (ref 0.0–0.2)
Indirect Bilirubin: 0.6 mg/dL (ref 0.3–0.9)
Total Bilirubin: 0.8 mg/dL (ref 0.3–1.2)
Total Protein: 5.7 g/dL — ABNORMAL LOW (ref 6.5–8.1)

## 2019-04-08 LAB — BASIC METABOLIC PANEL
Anion gap: 12 (ref 5–15)
BUN: 5 mg/dL — ABNORMAL LOW (ref 6–20)
CO2: 25 mmol/L (ref 22–32)
Calcium: 8.7 mg/dL — ABNORMAL LOW (ref 8.9–10.3)
Chloride: 107 mmol/L (ref 98–111)
Creatinine, Ser: 1.05 mg/dL (ref 0.61–1.24)
GFR calc Af Amer: >60 mL/min (ref >60)
GFR calc non Af Amer: >60 mL/min (ref >60)
Glucose, Bld: 92 mg/dL (ref 70–99)
Potassium: 2.9 mmol/L — ABNORMAL LOW (ref 3.5–5.1)
Sodium: 144 mmol/L (ref 135–145)

## 2019-04-08 LAB — CULTURE, BLOOD (ROUTINE X 2)
Culture: NO GROWTH
Special Requests: ADEQUATE

## 2019-04-08 MED ORDER — POTASSIUM CHLORIDE CRYS ER 20 MEQ PO TBCR
40.0000 meq | EXTENDED_RELEASE_TABLET | Freq: Once | ORAL | Status: AC
  Administered 2019-04-08: 40 meq via ORAL
  Filled 2019-04-08: qty 2

## 2019-04-08 MED ORDER — PANTOPRAZOLE SODIUM 40 MG PO TBEC
40.0000 mg | DELAYED_RELEASE_TABLET | Freq: Every day | ORAL | Status: DC
  Administered 2019-04-08 – 2019-04-09 (×2): 40 mg via ORAL
  Filled 2019-04-08 (×3): qty 1

## 2019-04-08 MED ORDER — PHENAZOPYRIDINE HCL 200 MG PO TABS
200.0000 mg | ORAL_TABLET | Freq: Once | ORAL | Status: AC
  Administered 2019-04-08: 200 mg via ORAL
  Filled 2019-04-08 (×2): qty 1

## 2019-04-08 MED ORDER — MAGNESIUM SULFATE 50 % IJ SOLN: 1.0000 g | Freq: Once | INTRAMUSCULAR | Status: DC

## 2019-04-08 MED ORDER — HYDRALAZINE HCL 20 MG/ML IJ SOLN
10.0000 mg | Freq: Once | INTRAMUSCULAR | Status: AC
  Administered 2019-04-08: 10 mg via INTRAVENOUS
  Filled 2019-04-08: qty 1

## 2019-04-08 MED ORDER — PHENOL 1.4 % MT LIQD
1.0000 | OROMUCOSAL | Status: DC | PRN
  Administered 2019-04-08: 1 via OROMUCOSAL
  Filled 2019-04-08: qty 177

## 2019-04-08 MED ORDER — MAGNESIUM SULFATE IN D5W 1-5 GM/100ML-% IV SOLN
1.0000 g | Freq: Once | INTRAVENOUS | Status: AC
  Administered 2019-04-08: 1 g via INTRAVENOUS
  Filled 2019-04-08: qty 100

## 2019-04-08 NOTE — Progress Notes (Signed)
Physical Therapy Treatment Patient Details Name: Brandon Thomas MRN: [id number] DOB: 12/10/1975 Today's Date: 04/08/2019    History of Present Illness Pt is a 43 y/o male with hx of polysubstance abuse found unresponsive at home by wife, LSW 8 hrs earlier with suspected drug overdose. Developed possible seizure like activity with EMS. Found with suspected aspiration, AKI, rhabdo, hyperkalemia, transaminitis, and hypotension. 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 Intubated 11/25- 11/27. R UE superficial vein thrombosis found.    PT Comments    Pt sitting up in bed upon PT arrival, agreeable to PT session. Pt was able to demo improved mobility in bed and in sit-stand transfer, requiring only supervision for the transfers. Pt was then able to demo improvements in ambulation technique and endurance, but STM deficits and cognition deficits are evident with advanced mobility as the pt re-told the same story x3 during short ambulation, was unable to remember what room he had come from, and demos poor problem solving in regards to use of IV pole for balance (held pole too close and kicked it with each step until given VCs for repositioning). The pt will continue to benefit from sig skilled PT to address safety with mobility and functional tasks.    Follow Up Recommendations  CIR     Equipment Recommendations  None recommended by PT    Recommendations for Other Services       Precautions / Restrictions Precautions Precautions: Fall Restrictions Weight Bearing Restrictions: No    Mobility  Bed Mobility Overal bed mobility: Modified Independent Bed Mobility: Rolling Rolling: Modified independent (Device/Increase time)   Supine to sit: Modified independent (Device/Increase time)     General bed mobility comments: pt long-sitting in bed upon PT arrival  Transfers Overall transfer level: Needs assistance Equipment used: None Transfers:  Sit to/from Stand Sit to Stand: Min guard         General transfer comment: Pt able to stand without use of arms, wobbly but no LOB immediately upon standing  Ambulation/Gait Ambulation/Gait assistance: Min guard Gait Distance (Feet): 250 Feet Assistive device: IV Pole Gait Pattern/deviations: Step-through pattern;Decreased stride length;Wide base of support Gait velocity: 0.57 m/s Gait velocity interpretation: 1.31 - 2.62 ft/sec, indicative of limited community ambulator General Gait Details: Pt reorts improved comfort with use of IV pole for stability, still amb with increased lat movement and wide BOS   Stairs             Wheelchair Mobility    Modified Rankin (Stroke Patients Only) Modified Rankin (Stroke Patients Only) Pre-Morbid Rankin Score: No symptoms Modified Rankin: Moderately severe disability     Balance Overall balance assessment: Needs assistance Sitting-balance support: No upper extremity supported;Feet supported Sitting balance-Leahy Scale: Good     Standing balance support: During functional activity;Single extremity supported Standing balance-Leahy Scale: Poor Standing balance comment: UE support or min assist for static standing                            Cognition Arousal/Alertness: Awake/alert Behavior During Therapy: WFL for tasks assessed/performed Overall Cognitive Status: Impaired/Different from baseline Area of Impairment: Orientation;Attention;Following commands;Memory;Safety/judgement;Awareness;Problem solving                 Orientation Level: Disoriented to;Situation Current Attention Level: Sustained Memory: Decreased recall of precautions;Decreased short-term memory(pt re-told the same story x3 in 16 min session) Following Commands: Follows one step commands consistently;Follows one step commands with increased  time;Follows multi-step commands inconsistently Safety/Judgement: Decreased awareness of  safety;Decreased awareness of deficits Awareness: Intellectual Problem Solving: Slow processing;Decreased initiation;Difficulty sequencing;Requires verbal cues;Requires tactile cues General Comments: pt pleasant and cooperative, repetition of history of [place] and [place]ac[place] Pontes family during session; fair immediate recall; minimal rpoblem solving without cues (pt continued to kick IV pole wheels throught amb with no alterations in gait)      Exercises      General Comments        Pertinent Vitals/Pain Pain Assessment: No/denies pain    Home Living                      Prior Function            PT Goals (current goals can now be found in the care plan section) Acute Rehab PT Goals Patient Stated Goal: to return home PT Goal Formulation: With patient Time For Goal Achievement: 04/20/19 Potential to Achieve Goals: Good Progress towards PT goals: Progressing toward goals    Frequency    Min 3X/week      PT Plan Current plan remains appropriate    Co-evaluation              AM-PAC PT "6 Clicks" Mobility   Outcome Measure  Help needed turning from your back to your side while in a flat bed without using bedrails?: None Help needed moving from lying on your back to sitting on the side of a flat bed without using bedrails?: A Little Help needed moving to and from a bed to a chair (including a wheelchair)?: A Little Help needed standing up from a chair using your arms (e.g., wheelchair or bedside chair)?: A Little Help needed to walk in hospital room?: A Little Help needed climbing 3-5 steps with a railing? : A Little 6 Click Score: 19    End of Session Equipment Utilized During Treatment: Gait belt Activity Tolerance: Patient tolerated treatment well Patient left: in chair;with call bell/phone within reach;with chair alarm set Nurse Communication: Mobility status PT Visit Diagnosis: Unsteadiness on feet (R26.81);Other abnormalities of gait and  mobility (R26.89);Muscle weakness (generalized) (M62.81)     Time: 2876-8115 PT Time Calculation (min) (ACUTE ONLY): 16 min  Charges:  $Gait Training: 8-22 mins                     Mickey Farber, PT, DPT   Acute Rehabilitation Department [phone]   Otho Bellows 04/08/2019, 9:33 AM

## 2019-04-08 NOTE — Progress Notes (Signed)
Right Upper Extremity measured.  Upper mark 39 cm lower mark is 29 cm.

## 2019-04-08 NOTE — Progress Notes (Signed)
This RN assisted patient while ambulating in hall, did two rounds and tolerated well.

## 2019-04-08 NOTE — Progress Notes (Signed)
Changed pt's foam dressing on R biceps, elevated arm. And pt asked for some sleeping medicine, this RN notified MD and was told to keep off of it for now. Will continue to monitor.

## 2019-04-08 NOTE — Progress Notes (Signed)
Right upper extremity measured. Lower mark is 29 cm. Upper mark is 39 cm.

## 2019-04-08 NOTE — Progress Notes (Signed)
Pt's BP: 179/85 (112), HR: 74, alert, oriented but with some short term memory deficit. MD notified about elevated BP, given hydralazine 10 mg once per MD's order. Will continue to monitor.

## 2019-04-08 NOTE — PMR Pre-admission (Signed)
PMR Admission Coordinator Pre-Admission Assessment  Patient: Brandon Thomas is an 43 y.o., male MRN: [id number] DOB: 06-16-1975 Height: 5' 10"  (177.8 cm) Weight: 104.8 kg              Insurance Information HMO:     PPO: yes     PCP:      IPA:      80/20:      OTHER:  PRIMARY: [place]      Policy#: [id number]      Subscriber: Patient CM Name: Faxed approval from utilization management team at [facility]     Phone#: [phone]     Fax#: [phone] Pre-Cert#: [id number]      Employer:  Josem Kaufmann ([id number]) provided by faxed approval for CIR. Pt is approved for 7 days of IP Rehab for DOS 04/08/19-04/15/19; updates due 04/14/19 or DC due 04/15/2019. (p): [phone]; (f): [phone] Benefits:  Phone #: [phone]     Name:  Eff. Date: 01/08/2019 - 07/06/2020     Deduct: $4,100 ($3,065.46 met)      Out of Pocket Max: $4,100 (includes deductible - $3,065.46 met)      Life Max:  CIR: after deductible, 100% coverage, 0% co-insurance      SNF: after deductible, 100% coverage, 0% co-insurance; with a limit of 60 days/cal yr Outpatient: $0/visit co-pay; limited to combined therapy limit of 20 visits PT/OT/ST     Home Health: after deductible, 100% coverage, 0% co-insurance; limited by medical necessity      DME: after deductible, 100% coverage, 0% co-insurance; pre-auth required if greater than $500       Providers:  SECONDARY: None      Policy#:       Subscriber:  CM Name:       Phone#:      Fax#:  Pre-Cert#:       Employer:  Benefits:  Phone #:      Name: Eff. Date:      Deduct:       Out of Pocket Max:       Life Max:  CIR:       SNF:  Outpatient:      Co-Pay:  Home Health:       Co-Pay:  DME:     Co-Pay:   Medicaid Application Date:       Case Manager:  Disability Application Date:       Case Worker:   The "Data Collection Information Summary" for patients in Inpatient Rehabilitation Facilities with attached "Privacy Act [place] Records" was provided  and verbally reviewed with: N/A  Emergency Contact Information Contact Information    Name Relation Home Work Mobile   [place] Spouse [phone]       Current Medical History  Patient Admitting Diagnosis: Impaired mobility and ADLs following fentanyl overdose  History of Present Illness: Brandon Thomas is a 43 y.o. male with history of depression, polysubstance abuse (completed drug rehab but family reported recent behaviors consistent with relapse), hypogonadism (self treats with IM steroids) who was admitted on 04/03/19 found unresponsive with gurgling sounds by wife and CPR initiated. Patient treated with narcan without response and had decerebrate posturing with concerns of prolonged seizure with CK 9495, WBC- 22.5, shocked liver, was febrile with T-101.5, had lactic acidosis and UDS positive for benzo's. He was intubated for airway protection, started on broad spectrum antibiotics for sepsis due to aspiration PNA and/or RUE cellulitis, IVF for rhabdomyolysis and hypotension. Pt ended up completing 7 days of  IV antibiotics and is to switch to oral Keflex.  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 Blood cultures negative. CSF negative for organisms and no growth. EEG showed severe diffuse encephalopathy. RUE dopplers with acute superficial thrombosis of right basilar vein. He tolerated extubation the next day and respiratory status stable Dr. Leonel Ramsay felt that work up pointed to fentanyl overdose and no further work up indicated.  Therapy evaluations completed yesterday revealing balance and cognitive deficits. CIR recommended due to functional deficits.  Pt is to be admitted to CIR on 04/09/2019.     Glasgow Coma Scale Score: 14  Past Medical History  Past Medical History:  Diagnosis Date  . Depression    Well treated without meds currently  . Elevated  LFTs 08/20/2013  . Hyperlipidemia 08/20/2013    Family History  family history is not on file. He was adopted.  Prior Rehab/Hospitalizations:  Has the patient had prior rehab or hospitalizations prior to admission? No  Has the patient had major surgery during 100 days prior to admission? No  Current Medications   Current Facility-Administered Medications:  .  0.9 %  sodium chloride infusion, , Intravenous, PRN, Candee Furbish, MD, Stopped at 04/07/19 1009 .  albuterol (PROVENTIL) (2.5 MG/3ML) 0.083% nebulizer solution 2.5 mg, 2.5 mg, Nebulization, Q6H PRN, Candee Furbish, MD, 2.5 mg at 04/04/19 0539 .  amLODipine (NORVASC) tablet 2.5 mg, 2.5 mg, Oral, Daily, Regalado, Belkys A, MD, 2.5 mg at 04/09/19 1000 .  aspirin EC tablet 81 mg, 81 mg, Oral, Daily, Regalado, Belkys A, MD, 81 mg at 04/09/19 1001 .  bisacodyl (DULCOLAX) suppository 10 mg, 10 mg, Rectal, Daily PRN, Arnell Asal, NP .  Chlorhexidine Gluconate Cloth 2 % PADS 6 each, 6 each, Topical, Q0600, Milagros Loll, MD, 6 each at 04/09/19 0700 .  docusate (COLACE) 50 MG/5ML liquid 100 mg, 100 mg, Per Tube, BID PRN, Jennelle Human B, NP .  heparin injection 5,000 Units, 5,000 Units, Subcutaneous, Q8H, Greta Doom, MD, 5,000 Units at 04/09/19 0700 .  ibuprofen (ADVIL) tablet 600 mg, 600 mg, Oral, Once, [place] Donnamarie Poag, DO .  ondansetron [facility]) injection 4 mg, 4 mg, Intravenous, Q6H PRN, Candee Furbish, MD, 4 mg at 04/05/19 1509 .  pantoprazole (PROTONIX) EC tablet 40 mg, 40 mg, Oral, Daily, Vann, Jessica U, DO, 40 mg at 04/09/19 1001 .  phenol (CHLORASEPTIC) mouth spray 1 spray, 1 spray, Mouth/Throat, PRN, Vann, Jessica U, DO, 1 spray at 04/08/19 1045 .  potassium chloride SA (KLOR-CON) CR tablet 40 mEq, 40 mEq, Oral, Q3H, [place] Eric J, DO, 40 mEq at 04/09/19 1001 .  sodium chloride flush (NS) 0.9 % injection 10-40 mL, 10-40 mL, Intracatheter, Q12H, Swayze, Ava, DO, 10 mL at 04/07/19 0951 .  sodium chloride  flush (NS) 0.9 % injection 10-40 mL, 10-40 mL, Intracatheter, PRN, Swayze, Ava, DO  Patients Current Diet:  Diet Order            Diet - low sodium heart healthy        Diet regular Room service appropriate? Yes; Fluid consistency: Thin  Diet effective now              Precautions / Restrictions Precautions Precautions: Fall Restrictions Weight Bearing Restrictions: No   Has the patient had 2 or more falls or a fall with injury in the past year?No  Prior Activity Level  Community (5-7x/wk): very active PTA, Independent, full time medical device salesman  Prior Functional Level Prior Function Level of Independence: Independent Comments: works in Post: Did the patient need help bathing, dressing, using the toilet or eating?  Independent  Indoor Mobility: Did the patient need assistance with walking from room to room (with or without device)? Independent  Stairs: Did the patient need assistance with internal or external stairs (with or without device)? Independent  Functional Cognition: Did the patient need help planning regular tasks such as shopping or remembering to take medications? Independent  Home Assistive Devices / Equipment Home Equipment: None  Prior Device Use: Indicate devices/aids used by the patient prior to current illness, exacerbation or injury? None of the above  Current Functional Level Cognition  Overall Cognitive Status: Impaired/Different from baseline Current Attention Level: Sustained Orientation Level: Oriented X4 Following Commands: Follows one step commands consistently, Follows one step commands with increased time, Follows multi-step commands inconsistently Safety/Judgement: Decreased awareness of safety, Decreased awareness of deficits General Comments: pt pleasant and cooperative, repetition of history of [place] and [facility] Pontes family during session; fair immediate recall; minimal rpoblem solving without  cues (pt continued to kick IV pole wheels throught amb with no alterations in gait)    Extremity Assessment (includes Sensation/Coordination)  Upper Extremity Assessment: Defer to OT evaluation RUE Deficits / Details: edema, limited functional use due to disomfort with movement  RUE Coordination: decreased fine motor, decreased gross motor LUE Deficits / Details: appears WFL   Lower Extremity Assessment: Generalized weakness    ADLs  Overall ADL's : Needs assistance/impaired Grooming: Minimal assistance, Standing Upper Body Bathing: Minimal assistance, Sitting Lower Body Bathing: Minimal assistance, Sit to/from stand Upper Body Dressing : Sitting, Minimal assistance Lower Body Dressing: Minimal assistance, Sit to/from stand Toilet Transfer: Minimal assistance, Ambulation Toilet Transfer Details (indicate cue type and reason): simulated in room Functional mobility during ADLs: Minimal assistance, Cueing for safety General ADL Comments: pt limited by impaired balance, decreased activity tolerance, generalized weakness and impaired cognition     Mobility  Overal bed mobility: Modified Independent Bed Mobility: Rolling Rolling: Modified independent (Device/Increase time) Supine to sit: Modified independent (Device/Increase time) General bed mobility comments: pt long-sitting in bed upon PT arrival    Transfers  Overall transfer level: Needs assistance Equipment used: None Transfers: Sit to/from Stand Sit to Stand: Min guard General transfer comment: Pt able to stand without use of arms, wobbly but no LOB immediately upon standing    Ambulation / Gait / Stairs / Wheelchair Mobility  Ambulation/Gait Ambulation/Gait assistance: Counsellor (Feet): 250 Feet Assistive device: IV Pole Gait Pattern/deviations: Step-through pattern, Decreased stride length, Wide base of support General Gait Details: Pt reorts improved comfort with use of IV pole for stability, still amb with  increased lat movement and wide BOS Gait velocity: 0.57 m/s Gait velocity interpretation: 1.31 - 2.62 ft/sec, indicative of limited community ambulator    Posture / Balance Dynamic Sitting Balance Sitting balance - Comments: able to don socks with min guard  Balance Overall balance assessment: Needs assistance Sitting-balance support: No upper extremity supported, Feet supported Sitting balance-Leahy Scale: Good Sitting balance - Comments: able to don socks with min guard  Standing balance support: During functional activity, Single extremity supported Standing balance-Leahy Scale: Poor Standing balance comment: UE support or min assist for static standing    Special needs/care consideration BiPAP/CPAP: no CPM: no Continuous Drip IV: no Dialysis: no  Days: no Life Vest: no Oxygen: no Special Bed: no Trach Size: no Wound Vac (area): no      Location: no Skin: abrasion to medial upper face, blister to right upper arm, cellulities to right, upper, mid arm, ecchymosis to left anterior arm                          Bowel mgmt:last BM 04/08/2019, [facility] Bladder mgmt: continent  Diabetic mgmt: no Behavioral consideration : memory issues Chemo/radiation : no     Previous Environmental health practitioner (from acute therapy documentation) Living Arrangements: Spouse/significant other, Children Available Help at Discharge: Family Type of Home: House Home Layout: Two level, Able to live on main level with bedroom/bathroom Home Access: Stairs to enter Entrance Stairs-Rails: Right Entrance Stairs-Number of Steps: 2 Bathroom Shower/Tub: Walk-in shower  Discharge Living Setting Plans for Discharge Living Setting: Patient's home, Lives with (comment)(wife and 2 young kids (73 and 2 yo)) Type of Home at Discharge: House Discharge Home Layout: Two level, Able to live on main level with bedroom/bathroom Alternate Level Stairs-Rails: None(NA) Alternate Level Stairs-Number of Steps: NA Discharge  Home Access: Stairs to enter Entrance Stairs-Rails: Can reach both Entrance Stairs-Number of Steps: 4 Discharge Bathroom Shower/Tub: Walk-in shower Discharge Bathroom Toilet: Standard Discharge Bathroom Accessibility: Yes How Accessible: Accessible via walker Does the patient have any problems obtaining your medications?: No  Social/Family/Support Systems Patient Roles: Spouse, Parent Contact Information: wife: [phone] Anticipated Caregiver: wife (danielle) Anticipated Caregiver's Contact Information: see above Ability/Limitations of Caregiver: supervision Caregiver Availability: 24/7 Discharge Plan Discussed with Primary Caregiver: Yes(pt and wife) Is Caregiver In Agreement with Plan?: Yes Does Caregiver/Family have Issues with Lodging/Transportation while Pt is in Rehab?: No   Goals/Additional Needs Patient/Family Goal for Rehab: PT/OT: Mod I; SLP: Mod I/Supervision  Expected length of stay: 5-8 days Cultural Considerations: NA Dietary Needs: regular diet, thin liquids Equipment Needs: TBD Pt/Family Agrees to Admission and willing to participate: Yes Program Orientation Provided & Reviewed with Pt/Caregiver Including Roles  & Responsibilities: Yes(pt and his wife)  Barriers to Discharge: Home environment Child psychotherapist, Insurance for SNF coverage  Barriers to Discharge Comments: steps to enter   Decrease burden of Care through IP rehab admission: NA   Possible need for SNF placement upon discharge:Not anticipated. Pt has good social support from wife at DC. Pt very motivated to return home to family quickly. Anticipate pt can return to Mod I/Supervision level quickly.    Patient Condition: This patient's medical and functional status has changed since the consult dated: 04/07/2019 in which the Rehabilitation Physician determined and documented that the patient's condition is appropriate for intensive rehabilitative care in an inpatient rehabilitation facility. See  "History of Present Illness" (above) for medical update. Functional changes are: improvement in transfer ability from Min A to Min G and improvement in ambulation from Min A +2 to Min G 250 feet. Patient's medical and functional status update has been discussed with the Rehabilitation physician and patient remains appropriate for inpatient rehabilitation. Will admit to inpatient rehab today.  Preadmission Screen Completed By:  Raechel Ache, OT, 04/09/2019 11:03 AM ______________________________________________________________________   Discussed status with Dr. Ranell Patrick on 04/09/2019 at 11:03AM and received approval for admission today.  Admission Coordinator:  Raechel Ache, time 11:03AM/Date 04/09/2019

## 2019-04-08 NOTE — Progress Notes (Signed)
Right upper extremity venous duplex complete. Please see CV Proc tab for preliminary results. [place], RVT 4:20 PM  04/08/2019

## 2019-04-08 NOTE — Progress Notes (Addendum)
PROGRESS NOTE    Brandon Thomas  OIN:[id number] DOB: [birth year] DOA: 04/02/2019 PCP: Marcial Pacas, DO   Brief Narrative: 43 year old with PMH significant for polysubstance abuse found unresponsive at home by wife, Suspected drug overdose. Developed possible seizure like activity with EMS. Required intubation in ER.  Aspiration, AKI, rhabdo, hyperkalemia, transaminitis, and hypotension. PCCM called for admit. MRI was performed and demonstrated CVA involving the bilateral hippocampus.  EEG was performed and demonstrated diffuse encephalopathy, but no seizure activity. Doppler of the upper extremities was performed on 04/05/2019 which demonstrated superficial thrombosis of the basilic vein in the right upper extremity. He has grown Coagulase negative staph from 1/2 blood cultures obtained on 04/03/2019. Surveillance cultures were drawn on 04/05/2019. They have had no growth. CK is down to 12,600 from 27,261 on admission. LFT's have been elevated, but are resolving. Probably due to shock liver related to drug overdose.  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 He also has history of cocaine use.  CIR placement if approved by insurance Evaluated by neurology: supportive care  Rhabdomyolysis: due to prolonged immobilization: CK level 27,000 trending down 12,000  Transaminases and elevated troponin -due to rhabdomyolysis/shock from overdose  fentanyl induced hippocampal  Injury/stroke -continues to have short-term memory deficit. CIR evaluation  MSSA pneumonia: -Change vancomycin to Ancef.  (vanc 11/26-11/30; ancef 11/30- current)  Hypokalemia -replace PO  -replace Mg x 1 -recheck in AM  Right basilic vein superficial thrombosis:  -Started baby aspirin.  Warm compress -Keep arm elevated  -repeat  U/S -pulses intact  Coagulase negative staph 1 out of 2 blood cultures positive from 11/26 Likely a contaminant Repeated blood cultures for surveillance 11/28 no growth to date.  Echocardiogram negative for vegetation  Cellulitis of the chest wall and right upper extremity:  -continue with IV antibiotics.  Hypertension:  -Blood pressure has been elevated restart low-dose Norvasc  obesity Estimated body mass index is 33.15 kg/m as calculated from the following:   Height as of this encounter: 5' 10"  (1.778 m).   Weight as of this encounter: 104.8 kg.   DVT prophylaxis: Heparin SQ Code Status: Full code Family Communication: Care discussed with patient Disposition Plan: Awaiting CIR approval Consultants:  -Neurology -PCCM  Procedures:   EEG  LP     Subjective: C/o sore throat and arm swelling  Objective: Vitals:   04/08/19 0325 04/08/19 0616 04/08/19 0855 04/08/19 0949  BP: (!) 163/72 (!) 145/73  131/76  Pulse: 86 76 100 84  Resp: 18   18  Temp: 99.2 F (37.3 C) 99.2 F (37.3 C)  98.9 F (37.2 C)  TempSrc: Oral Oral  Axillary  SpO2: 97% 97%  97%  Weight:      Height:        Intake/Output Summary (Last 24 hours) at 04/08/2019 1017 Last data filed at 04/08/2019 0730 Gross per 24 hour  Intake 2474.71 ml  Output 4620 ml  Net -2145.29 ml   Filed Weights   04/05/19 0422 04/06/19 0500 04/07/19 0500  Weight: 102.8 kg 105.8 kg 104.8 kg    Examination:  General exam: in bed Respiratory system: no increased work of breathing Cardiovascular system: rrr Gastrointestinal system: +BS, soft, NT Central nervous system: alert Extremities: moves all 4 ext, right arm swelling, pulses intact.     Data Reviewed: I  have personally reviewed following labs and imaging studies  CBC: Recent Labs  Lab 04/04/19 0645 04/05/19 1105 04/06/19 0444 04/07/19 0528 04/08/19 0338  WBC 12.0* 10.1 9.4 7.1 9.4  NEUTROABS 7.5  --   --   --   --   HGB 13.2 12.3* 12.2*  11.4* 13.2  HCT 41.4 37.8* 38.0* 35.7* 41.1  MCV 92.4 89.8 90.5 90.4 89.9  PLT 332 293 284 263 825   Basic Metabolic Panel: Recent Labs  Lab 04/03/19 0214  04/03/19 1849  04/04/19 0526  04/04/19 1648 04/05/19 0337 04/05/19 1105 04/06/19 0444 04/07/19 0528 04/08/19 0338  NA  --    < >  --   --  145  --   --   --  146* 144 143 144  K  --    < > 4.3   < > 3.6   < > 4.4  --  3.7 3.3* 3.0* 2.9*  CL  --    < >  --   --  111  --   --   --  107 108 105 107  CO2  --    < >  --   --  19*  --   --   --  26 26 29 25   GLUCOSE  --    < >  --   --  79  --   --   --  101* 100* 83 92  BUN  --    < >  --   --  18  --   --   --  9 7 6  5*  CREATININE  --    < >  --   --  1.24  --   --   --  1.15 1.06 0.96 1.05  CALCIUM  --    < >  --   --  8.3*  --   --   --  9.1 8.5* 7.1* 8.7*  MG 3.2*  --  1.9  --  2.0  --  1.9 1.9  --   --   --   --   PHOS 11.0*  --  1.6*  --  1.1*  --  3.0 3.1  --   --   --   --    < > = values in this interval not displayed.   GFR: Estimated Creatinine Clearance: 108.8 mL/min (by C-G formula based on SCr of 1.05 mg/dL). Liver Function Tests: Recent Labs  Lab 04/04/19 1034 04/05/19 0337 04/06/19 0444 04/07/19 0528 04/08/19 0338  AST 409* 491* 300* 144* 135*  ALT 224* 295* 276* 189* 207*  ALKPHOS 41 41 43 38 47  BILITOT 0.7 0.7 0.8 0.8 0.8  PROT 5.0* 5.7* 5.4* 4.6* 5.7*  ALBUMIN 3.0* 3.1* 2.7* 2.4* 3.1*   No results for input(s): LIPASE, AMYLASE in the last 168 hours. Recent Labs  Lab 04/03/19 0323  AMMONIA 44*   Coagulation Profile: Recent Labs  Lab 04/02/19 2201 04/03/19 0323 04/05/19 0337  INR 1.1 1.1 1.1   Cardiac Enzymes: Recent Labs  Lab 04/03/19 0323 04/03/19 0605 04/04/19 1034 04/06/19 0444 04/07/19 0528  CKTOTAL 27,261* 05,397* 24,818* 12,146* 4,316*  CKMB  --   --  74.3*  --   --    BNP (last 3 results) No results for input(s): PROBNP in the last 8760 hours. HbA1C: No results for input(s): HGBA1C in the last 72 hours. CBG: Recent  Labs  Lab 04/07/19 1605 04/07/19 1931 04/07/19 2316 04/08/19 0425  04/08/19 0727  GLUCAP 96 100* 88 87 106*   Lipid Profile: No results for input(s): CHOL, HDL, LDLCALC, TRIG, CHOLHDL, LDLDIRECT in the last 72 hours. Thyroid Function Tests: No results for input(s): TSH, T4TOTAL, FREET4, T3FREE, THYROIDAB in the last 72 hours. Anemia Panel: No results for input(s): VITAMINB12, FOLATE, FERRITIN, TIBC, IRON, RETICCTPCT in the last 72 hours. Sepsis Labs: Recent Labs  Lab 04/02/19 2109 04/03/19 0013 04/03/19 0143 04/03/19 0254 04/04/19 1034  PROCALCITON  --   --  14.02  --   --   LATICACIDVEN 8.8* 4.1*  --  5.2* 1.1    Recent Results (from the past 240 hour(s))  Blood Culture (routine x 2)     Status: Abnormal   Collection Time: 04/02/19  9:00 PM   Specimen: BLOOD  Result Value Ref Range Status   Specimen Description BLOOD RIGHT ANTECUBITAL  Final   Special Requests   Final    BOTTLES DRAWN AEROBIC AND ANAEROBIC Blood Culture adequate volume   Culture  Setup Time   Final    AEROBIC BOTTLE ONLY GRAM POSITIVE COCCI CRITICAL RESULT CALLED TO, READ BACK BY AND VERIFIED WITH: L CURRAN PHARMD 04/03/19 2102 JDW    Culture (A)  Final    STAPHYLOCOCCUS SPECIES (COAGULASE NEGATIVE) THE SIGNIFICANCE OF ISOLATING THIS ORGANISM FROM A SINGLE SET OF BLOOD CULTURES WHEN MULTIPLE SETS ARE DRAWN IS UNCERTAIN. PLEASE NOTIFY THE MICROBIOLOGY DEPARTMENT WITHIN ONE WEEK IF SPECIATION AND SENSITIVITIES ARE REQUIRED. Performed at [facility], [place] [address] [place], [place] [address]    Report Status 04/04/2019 FINAL  Final  SARS Coronavirus 2 by RT PCR (hospital order, performed in [facility] hospital lab) Nasopharyngeal Nasopharyngeal Swab     Status: None   Collection Time: 04/02/19 10:37 PM   Specimen: Nasopharyngeal Swab  Result Value Ref Range Status   SARS Coronavirus 2 NEGATIVE NEGATIVE Final    Comment: (NOTE) SARS-CoV-2 target nucleic acids are NOT DETECTED. The SARS-CoV-2  RNA is generally detectable in upper and lower respiratory specimens during the acute phase of infection. The lowest concentration of SARS-CoV-2 viral copies this assay can detect is 250 copies / mL. A negative result does not preclude SARS-CoV-2 infection and should not be used as the sole basis for treatment or other patient management decisions.  A negative result may occur with improper specimen collection / handling, submission of specimen other than nasopharyngeal swab, presence of viral mutation(s) within the areas targeted by this assay, and inadequate number of viral copies (<250 copies / mL). A negative result must be combined with clinical observations, patient history, and epidemiological information. Fact Sheet for Patients:   StrictlyIdeas.no Fact Sheet for Healthcare Providers: BankingDealers.co.za This test is not yet approved or cleared  by the [place] FDA and has been authorized for detection and/or diagnosis of SARS-CoV-2 by FDA under an Emergency Use Authorization (EUA).  This EUA will remain in effect (meaning this test can be used) for the duration of the COVID-19 declaration under Section 564(b)(1) of the Act, 21 U.S.C. section 360bbb-3(b)(1), unless the authorization is terminated or revoked sooner. Performed at [facility], [place] [address] [place], [place] [address]   Blood Culture (routine x 2)     Status: None   Collection Time: 04/03/19 12:13 AM   Specimen: BLOOD RIGHT FOREARM  Result Value Ref Range Status   Specimen Description BLOOD RIGHT FOREARM  Final   Special Requests   Final    BOTTLES DRAWN AEROBIC AND  ANAEROBIC Blood Culture adequate volume   Culture   Final    NO GROWTH 5 DAYS Performed at [facility], [place] [address], [place], [place] [address]    Report Status 04/08/2019 FINAL  Final  MRSA PCR Screening     Status: None   Collection Time: 04/03/19  5:36 AM  Result Value  Ref Range Status   MRSA by PCR NEGATIVE NEGATIVE Final    Comment:        The GeneXpert MRSA Assay (FDA approved for NASAL specimens only), is one component of a comprehensive MRSA colonization surveillance program. It is not intended to diagnose MRSA infection nor to guide or monitor treatment for MRSA infections. Performed at [facility], [place] [address] [place], [place] [address]   Culture, respiratory (tracheal aspirate)     Status: None   Collection Time: 04/03/19  8:07 AM   Specimen: Tracheal Aspirate; Respiratory  Result Value Ref Range Status   Specimen Description TRACHEAL ASPIRATE  Final   Special Requests NONE  Final   Gram Stain   Final    RARE WBC PRESENT,BOTH PMN AND MONONUCLEAR RARE GRAM POSITIVE COCCI IN PAIRS    Culture   Final    FEW STAPHYLOCOCCUS AUREUS WITHIN MIXED ORGANISMS Performed at [facility], [place] [address] [place], [place] [address]    Report Status 04/07/2019 FINAL  Final   Organism ID, Bacteria STAPHYLOCOCCUS AUREUS  Final      Susceptibility   Staphylococcus aureus - MIC*    CIPROFLOXACIN <=0.5 SENSITIVE Sensitive     ERYTHROMYCIN <=0.25 SENSITIVE Sensitive     GENTAMICIN <=0.5 SENSITIVE Sensitive     OXACILLIN 0.5 SENSITIVE Sensitive     TETRACYCLINE <=1 SENSITIVE Sensitive     VANCOMYCIN <=0.5 SENSITIVE Sensitive     TRIMETH/SULFA <=10 SENSITIVE Sensitive     CLINDAMYCIN <=0.25 SENSITIVE Sensitive     RIFAMPIN <=0.5 SENSITIVE Sensitive     Inducible Clindamycin NEGATIVE Sensitive     * FEW STAPHYLOCOCCUS AUREUS  CSF culture     Status: None   Collection Time: 04/03/19 12:15 PM   Specimen: CSF; Cerebrospinal Fluid  Result Value Ref Range Status   Specimen Description CSF  Final   Special Requests NONE  Final   Gram Stain   Final    WBC PRESENT, PREDOMINANTLY MONONUCLEAR NO ORGANISMS SEEN CYTOSPIN SMEAR    Culture   Final    NO GROWTH 3 DAYS Performed at [facility], [place] [address] [place], [place]  [address]    Report Status 04/06/2019 FINAL  Final  Culture, blood (routine x 2)     Status: None (Preliminary result)   Collection Time: 04/05/19 11:05 AM   Specimen: BLOOD LEFT HAND  Result Value Ref Range Status   Specimen Description BLOOD LEFT HAND  Final   Special Requests   Final    BOTTLES DRAWN AEROBIC AND ANAEROBIC Blood Culture adequate volume   Culture   Final    NO GROWTH 3 DAYS Performed at [facility], [place] [address] [place], [place] [address]    Report Status PENDING  Incomplete  Culture, blood (routine x 2)     Status: None (Preliminary result)   Collection Time: 04/05/19 12:45 PM   Specimen: BLOOD LEFT HAND  Result Value Ref Range Status   Specimen Description BLOOD LEFT HAND  Final   Special Requests   Final    BOTTLES DRAWN AEROBIC ONLY Blood Culture  adequate volume   Culture   Final    NO GROWTH 3 DAYS Performed at [facility], [place] [address], [place], [place] [address]    Report Status PENDING  Incomplete         Radiology Studies: [place] Ekg Site Rite  Result Date: 04/06/2019 If Site Rite image not attached, placement could not be confirmed due to current cardiac rhythm.       Scheduled Meds:  amLODipine  2.5 mg Oral Daily   aspirin EC  81 mg Oral Daily   Chlorhexidine Gluconate Cloth  6 each Topical Q0600   heparin  5,000 Units Subcutaneous Q8H   magnesium sulfate  1 g Intravenous Once   pantoprazole  40 mg Oral Daily   pneumococcal 23 valent vaccine  0.5 mL Intramuscular Tomorrow-1000   sodium chloride flush  10-40 mL Intracatheter Q12H   Continuous Infusions:  sodium chloride Stopped (04/07/19 1009)    ceFAZolin (ANCEF) IV 2 g (04/08/19 0229)     LOS: 5 days    Time spent: 35 minutes.     Geradine Girt, MD [facility]   If 7PM-7AM, please contact night-coverage [email or web address] Password [email or web address] 04/08/2019, 10:17 AM

## 2019-04-09 ENCOUNTER — Inpatient Hospital Stay (HOSPITAL_COMMUNITY)
Admission: RE | Admit: 2019-04-09 | Discharge: 2019-04-14 | DRG: 091 | Disposition: A | Discharge status: Home / Self Care | Payer: BLUE CROSS/BLUE SHIELD | Source: Intra-hospital | Attending: Physical Medicine & Rehabilitation | Admitting: Physical Medicine & Rehabilitation

## 2019-04-09 ENCOUNTER — Encounter (HOSPITAL_COMMUNITY): Payer: Self-pay

## 2019-04-09 ENCOUNTER — Other Ambulatory Visit: Payer: Self-pay

## 2019-04-09 DIAGNOSIS — R600 Localized edema: Secondary | ICD-10-CM | POA: Diagnosis not present

## 2019-04-09 DIAGNOSIS — F17220 Nicotine dependence, chewing tobacco, uncomplicated: Secondary | ICD-10-CM | POA: Diagnosis present

## 2019-04-09 DIAGNOSIS — R945 Abnormal results of liver function studies: Secondary | ICD-10-CM | POA: Diagnosis present

## 2019-04-09 DIAGNOSIS — T796XXS Traumatic ischemia of muscle, sequela: Secondary | ICD-10-CM | POA: Diagnosis not present

## 2019-04-09 DIAGNOSIS — F1910 Other psychoactive substance abuse, uncomplicated: Secondary | ICD-10-CM | POA: Diagnosis present

## 2019-04-09 DIAGNOSIS — I82611 Acute embolism and thrombosis of superficial veins of right upper extremity: Secondary | ICD-10-CM | POA: Diagnosis present

## 2019-04-09 DIAGNOSIS — T40411D Poisoning by fentanyl or fentanyl analogs, accidental (unintentional), subsequent encounter: Secondary | ICD-10-CM | POA: Diagnosis not present

## 2019-04-09 DIAGNOSIS — J9602 Acute respiratory failure with hypercapnia: Secondary | ICD-10-CM

## 2019-04-09 DIAGNOSIS — Z86718 Personal history of other venous thrombosis and embolism: Secondary | ICD-10-CM | POA: Diagnosis not present

## 2019-04-09 DIAGNOSIS — R300 Dysuria: Secondary | ICD-10-CM | POA: Diagnosis not present

## 2019-04-09 DIAGNOSIS — T50901A Poisoning by unspecified drugs, medicaments and biological substances, accidental (unintentional), initial encounter: Secondary | ICD-10-CM

## 2019-04-09 DIAGNOSIS — R7989 Other specified abnormal findings of blood chemistry: Secondary | ICD-10-CM | POA: Diagnosis present

## 2019-04-09 DIAGNOSIS — R488 Other symbolic dysfunctions: Secondary | ICD-10-CM | POA: Diagnosis present

## 2019-04-09 DIAGNOSIS — L0390 Cellulitis, unspecified: Secondary | ICD-10-CM | POA: Diagnosis present

## 2019-04-09 DIAGNOSIS — J15211 Pneumonia due to Methicillin susceptible Staphylococcus aureus: Secondary | ICD-10-CM | POA: Diagnosis present

## 2019-04-09 DIAGNOSIS — M6282 Rhabdomyolysis: Secondary | ICD-10-CM | POA: Diagnosis present

## 2019-04-09 DIAGNOSIS — G931 Anoxic brain damage, not elsewhere classified: Principal | ICD-10-CM | POA: Diagnosis present

## 2019-04-09 DIAGNOSIS — I10 Essential (primary) hypertension: Secondary | ICD-10-CM | POA: Diagnosis present

## 2019-04-09 DIAGNOSIS — L03313 Cellulitis of chest wall: Secondary | ICD-10-CM

## 2019-04-09 DIAGNOSIS — G9340 Encephalopathy, unspecified: Secondary | ICD-10-CM | POA: Diagnosis not present

## 2019-04-09 DIAGNOSIS — L03113 Cellulitis of right upper limb: Secondary | ICD-10-CM | POA: Diagnosis present

## 2019-04-09 DIAGNOSIS — E876 Hypokalemia: Secondary | ICD-10-CM | POA: Diagnosis present

## 2019-04-09 DIAGNOSIS — G5601 Carpal tunnel syndrome, right upper limb: Secondary | ICD-10-CM | POA: Diagnosis present

## 2019-04-09 DIAGNOSIS — I639 Cerebral infarction, unspecified: Secondary | ICD-10-CM | POA: Diagnosis present

## 2019-04-09 DIAGNOSIS — F1110 Opioid abuse, uncomplicated: Secondary | ICD-10-CM | POA: Diagnosis present

## 2019-04-09 LAB — HEPATIC FUNCTION PANEL
ALT: 200 U/L — ABNORMAL HIGH (ref 0–44)
AST: 102 U/L — ABNORMAL HIGH (ref 15–41)
Albumin: 3 g/dL — ABNORMAL LOW (ref 3.5–5.0)
Alkaline Phosphatase: 51 U/L (ref 38–126)
Bilirubin, Direct: <0.1 mg/dL (ref 0.0–0.2)
Total Bilirubin: 0.3 mg/dL (ref 0.3–1.2)
Total Protein: 5.5 g/dL — ABNORMAL LOW (ref 6.5–8.1)

## 2019-04-09 LAB — CBC
HCT: 42.4 % (ref 39.0–52.0)
Hemoglobin: 13.4 g/dL (ref 13.0–17.0)
MCH: 28.7 pg (ref 26.0–34.0)
MCHC: 31.6 g/dL (ref 30.0–36.0)
MCV: 90.8 fL (ref 80.0–100.0)
Platelets: 323 10*3/uL (ref 150–400)
RBC: 4.67 MIL/uL (ref 4.22–5.81)
RDW: 14 % (ref 11.5–15.5)
WBC: 9 10*3/uL (ref 4.0–10.5)
nRBC: 0 % (ref 0.0–0.2)

## 2019-04-09 LAB — GLUCOSE, CAPILLARY
Glucose-Capillary: 109 mg/dL — ABNORMAL HIGH (ref 70–99)
Glucose-Capillary: 98 mg/dL (ref 70–99)
Glucose-Capillary: 99 mg/dL (ref 70–99)

## 2019-04-09 LAB — BASIC METABOLIC PANEL
Anion gap: 12 (ref 5–15)
BUN: 9 mg/dL (ref 6–20)
CO2: 26 mmol/L (ref 22–32)
Calcium: 8.5 mg/dL — ABNORMAL LOW (ref 8.9–10.3)
Chloride: 105 mmol/L (ref 98–111)
Creatinine, Ser: 1.16 mg/dL (ref 0.61–1.24)
GFR calc Af Amer: >60 mL/min (ref >60)
GFR calc non Af Amer: >60 mL/min (ref >60)
Glucose, Bld: 113 mg/dL — ABNORMAL HIGH (ref 70–99)
Potassium: 3 mmol/L — ABNORMAL LOW (ref 3.5–5.1)
Sodium: 143 mmol/L (ref 135–145)

## 2019-04-09 LAB — URINALYSIS, ROUTINE W REFLEX MICROSCOPIC
Bacteria, UA: NONE SEEN
Bilirubin Urine: NEGATIVE
Glucose, UA: NEGATIVE mg/dL
Hgb urine dipstick: NEGATIVE
Ketones, ur: NEGATIVE mg/dL
Nitrite: NEGATIVE
Protein, ur: NEGATIVE mg/dL
Specific Gravity, Urine: 1.011 (ref 1.005–1.030)
pH: 7 (ref 5.0–8.0)

## 2019-04-09 MED ORDER — PROCHLORPERAZINE MALEATE 5 MG PO TABS: 5.0000 mg | ORAL_TABLET | Freq: Four times a day (QID) | ORAL | Status: DC | PRN

## 2019-04-09 MED ORDER — PANTOPRAZOLE SODIUM 40 MG PO TBEC
40.0000 mg | DELAYED_RELEASE_TABLET | Freq: Every day | ORAL | Status: DC
  Administered 2019-04-10 – 2019-04-14 (×5): 40 mg via ORAL
  Filled 2019-04-09 (×5): qty 1

## 2019-04-09 MED ORDER — ASPIRIN EC 81 MG PO TBEC
81.0000 mg | DELAYED_RELEASE_TABLET | Freq: Every day | ORAL | Status: DC
  Administered 2019-04-10 – 2019-04-14 (×5): 81 mg via ORAL
  Filled 2019-04-09 (×5): qty 1

## 2019-04-09 MED ORDER — GUAIFENESIN-DM 100-10 MG/5ML PO SYRP: 5.0000 mL | ORAL_SOLUTION | Freq: Four times a day (QID) | ORAL | Status: DC | PRN

## 2019-04-09 MED ORDER — PROCHLORPERAZINE EDISYLATE 10 MG/2ML IJ SOLN: 5.0000 mg | Freq: Four times a day (QID) | INTRAMUSCULAR | Status: DC | PRN

## 2019-04-09 MED ORDER — BISACODYL 10 MG RE SUPP: 10.0000 mg | Freq: Every day | RECTAL | Status: DC | PRN

## 2019-04-09 MED ORDER — POTASSIUM CHLORIDE CRYS ER 20 MEQ PO TBCR
40.0000 meq | EXTENDED_RELEASE_TABLET | ORAL | Status: AC
  Administered 2019-04-09: 15:00:00 40 meq via ORAL
  Filled 2019-04-09: qty 2

## 2019-04-09 MED ORDER — AMLODIPINE BESYLATE 2.5 MG PO TABS: 2.5000 mg | ORAL_TABLET | Freq: Every day | ORAL | Status: DC

## 2019-04-09 MED ORDER — FLEET ENEMA 7-19 GM/118ML RE ENEM: 1.0000 | ENEMA | Freq: Once | RECTAL | Status: DC | PRN

## 2019-04-09 MED ORDER — ASPIRIN 81 MG PO TBEC: 81.0000 mg | DELAYED_RELEASE_TABLET | Freq: Every day | ORAL | Status: DC

## 2019-04-09 MED ORDER — IBUPROFEN 600 MG PO TABS
600.0000 mg | ORAL_TABLET | Freq: Once | ORAL | Status: AC
  Administered 2019-04-09: 600 mg via ORAL
  Filled 2019-04-09: qty 1

## 2019-04-09 MED ORDER — POTASSIUM CHLORIDE CRYS ER 20 MEQ PO TBCR
40.0000 meq | EXTENDED_RELEASE_TABLET | ORAL | Status: DC
  Administered 2019-04-09 (×2): 40 meq via ORAL
  Filled 2019-04-09 (×2): qty 2

## 2019-04-09 MED ORDER — ALBUTEROL SULFATE (2.5 MG/3ML) 0.083% IN NEBU: 2.5000 mg | INHALATION_SOLUTION | Freq: Four times a day (QID) | RESPIRATORY_TRACT | Status: DC | PRN

## 2019-04-09 MED ORDER — PROCHLORPERAZINE 25 MG RE SUPP: 12.5000 mg | Freq: Four times a day (QID) | RECTAL | Status: DC | PRN

## 2019-04-09 MED ORDER — AMLODIPINE BESYLATE 2.5 MG PO TABS
2.5000 mg | ORAL_TABLET | Freq: Every day | ORAL | Status: DC
  Administered 2019-04-10 – 2019-04-14 (×5): 2.5 mg via ORAL
  Filled 2019-04-09 (×5): qty 1

## 2019-04-09 MED ORDER — PANTOPRAZOLE SODIUM 40 MG PO TBEC: 40.0000 mg | DELAYED_RELEASE_TABLET | Freq: Every day | ORAL | Status: DC

## 2019-04-09 MED ORDER — DOCUSATE SODIUM 50 MG/5ML PO LIQD: 100.0000 mg | Freq: Two times a day (BID) | ORAL | Status: DC | PRN

## 2019-04-09 MED ORDER — DIPHENHYDRAMINE HCL 12.5 MG/5ML PO ELIX
12.5000 mg | ORAL_SOLUTION | Freq: Four times a day (QID) | ORAL | Status: DC | PRN
  Administered 2019-04-13: 25 mg via ORAL
  Filled 2019-04-09: qty 10

## 2019-04-09 MED ORDER — HEPARIN SODIUM (PORCINE) 5000 UNIT/ML IJ SOLN
5000.0000 [IU] | Freq: Three times a day (TID) | INTRAMUSCULAR | Status: DC
  Administered 2019-04-09 – 2019-04-14 (×15): 5000 [IU] via SUBCUTANEOUS
  Filled 2019-04-09 (×15): qty 1

## 2019-04-09 MED ORDER — ACETAMINOPHEN 325 MG PO TABS
325.0000 mg | ORAL_TABLET | ORAL | Status: DC | PRN
  Administered 2019-04-09: 650 mg via ORAL
  Administered 2019-04-10: 325 mg via ORAL
  Administered 2019-04-13: 650 mg via ORAL
  Filled 2019-04-09 (×3): qty 2

## 2019-04-09 MED ORDER — CEPHALEXIN 250 MG PO CAPS
500.0000 mg | ORAL_CAPSULE | Freq: Three times a day (TID) | ORAL | Status: DC
  Administered 2019-04-10 – 2019-04-11 (×3): 500 mg via ORAL
  Filled 2019-04-09 (×3): qty 2

## 2019-04-09 MED ORDER — CEPHALEXIN 500 MG PO CAPS: 500.0000 mg | ORAL_CAPSULE | Freq: Two times a day (BID) | ORAL | 0 refills | Status: DC

## 2019-04-09 MED ORDER — TRAZODONE HCL 50 MG PO TABS
25.0000 mg | ORAL_TABLET | Freq: Every evening | ORAL | Status: DC | PRN
  Administered 2019-04-09 – 2019-04-13 (×4): 50 mg via ORAL
  Filled 2019-04-09 (×5): qty 1

## 2019-04-09 MED ORDER — ALUM & MAG HYDROXIDE-SIMETH 200-200-20 MG/5ML PO SUSP: 30.0000 mL | ORAL | Status: DC | PRN

## 2019-04-09 MED ORDER — POLYETHYLENE GLYCOL 3350 17 G PO PACK: 17.0000 g | PACK | Freq: Every day | ORAL | Status: DC | PRN

## 2019-04-09 MED ORDER — POTASSIUM CHLORIDE CRYS ER 20 MEQ PO TBCR
20.0000 meq | EXTENDED_RELEASE_TABLET | Freq: Every day | ORAL | Status: DC
  Administered 2019-04-10: 20 meq via ORAL
  Filled 2019-04-09: qty 1

## 2019-04-09 NOTE — Progress Notes (Signed)
Brandon Ribas, MD  Physician  Physical Medicine and Rehabilitation  Consult Note  Signed  Date of Service:  04/07/2019 9:07 AM      Related encounter: ED to Hosp-Admission (Discharged) from 04/02/2019 in [place] Collapse All         Physical Medicine and Rehabilitation Consult   Reason for Consult: Encephalopathy due to fentanyl overdose Referring Physician: Dr. Tyrell Antonio   HPI: Brandon Thomas is a 43 y.o. male with history of depression, polysubstance abuse (completed drug rehab but family reported recent behaviors consistent with relapse), hypogonadism (self treats with IM steroids) who was admitted on 04/03/19 found unresponsive with gurgling sounds by wife and CPR initiated. Patient treated with narcan without response and had decerebrate posturing with concerns of prolonged seizure with CK 9495, WBC- 22.5, shocked liver, was febrile with T-101.5, had lactic acidosis and UDS positive for benzo's. He was intubated for airway protection, started on broad spectrum antibiotics for sepsis due to aspiration PNA and/or RUE cellulitis, IVF for rhabdomyolysis and hypotension. 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 Blood cultures negative. CSF negative for organisms and no growth. EEG showed severe diffuse encephalopathy. RUE dopplers with acute superficial thrombosis of right basilar vein. He tolerated extubation the next day and respiratory status stable Dr. Leonel Ramsay felt that work up pointed to fentanyl overdose and no further work up indicated. Therapy evaluations completed yesterday revealing balance and cognitive deficits. Nursing reports that he sat up in a chair for most of the shift yesterday.  CIR recommended due to functional deficits.     Review of Systems    Constitutional: Negative for chills and fever.  HENT: Negative for hearing loss and tinnitus.   Eyes: Negative for blurred vision and double vision.  Respiratory: Negative for cough and shortness of breath.   Cardiovascular: Negative for chest pain and palpitations.       RUE edema with pain/numbness.   Gastrointestinal: Negative for abdominal pain, heartburn and nausea.       Tongue soreness  Musculoskeletal: Negative for back pain and joint pain.  Neurological: Negative for dizziness, speech change, weakness and headaches.  Psychiatric/Behavioral: Positive for memory loss.         Past Medical History:  Diagnosis Date   Depression    Well treated without meds currently   Elevated LFTs 08/20/2013   Hyperlipidemia 08/20/2013         Past Surgical History:  Procedure Laterality Date   WISDOM TOOTH EXTRACTION      Family History  Adopted: Yes    Social History:  Married--has two young daughters at home. Works as a Counselling psychologist. He  reports that he has never smoked. His smokeless tobacco use includes chew-- a can daily. He reports current alcohol use of about 1.0 standard drinks of alcohol per week. He reports that he uses opiates--"whatever he can get--injects it"          Allergies  Allergen Reactions   Atorvastatin     RUQ pain   Crestor [Rosuvastatin]     Elevated LFTs          Medications Prior to Admission  Medication Sig Dispense Refill   cetirizine (ZYRTEC) 10 MG tablet Take 10 mg by mouth daily.     oxymetazoline (AFRIN) 0.05 %  nasal spray Place 1 spray into both nostrils 2 (two) times daily as needed for congestion.     sertraline (ZOLOFT) 100 MG tablet Take 100 mg by mouth daily.     sodium chloride (OCEAN) 0.65 % SOLN nasal spray Place 1 spray into both nostrils as needed for congestion.     tetrahydrozoline-zinc (VISINE-AC) 0.05-0.25 % ophthalmic solution Place 2 drops into both eyes daily as  needed (dry eyes).     diltiazem 2 % GEL Apply 1 application topically 5 (five) times daily. (Patient not taking: Reported on 04/03/2019) 30 g 6   Na Sulfate-K Sulfate-Mg Sulf SOLN Take 1 kit by mouth once. (Patient not taking: Reported on 04/03/2019) 354 mL 0   NEEDLE, DISP, 22 G 22G X 1-1/2" MISC Inject into skin every 14 days. 20 each prn   pravastatin (PRAVACHOL) 20 MG tablet Take 1 tablet (20 mg total) by mouth daily. (Patient not taking: Reported on 04/03/2019) 90 tablet 1   Syringe, Disposable, 3 ML MISC Inject into skin every 14 days. 20 each prn   testosterone cypionate (DEPOTESTOSTERONE CYPIONATE) 200 MG/ML injection Inject 1 mL (200 mg total) into the muscle every 14 (fourteen) days. (Patient not taking: Reported on 04/03/2019) 4 mL 0    Home: Home Living Family/patient expects to be discharged to:: Private residence Living Arrangements: Spouse/significant other, Children Available Help at Discharge: Family Type of Home: House Home Access: Stairs to enter Technical brewer of Steps: 2 Entrance Stairs-Rails: Right Home Layout: Two level, Able to live on main level with bedroom/bathroom Bathroom Shower/Tub: Walk-in shower Home Equipment: None  Functional History: Prior Function Level of Independence: Independent Comments: works in Econom[employer] Status:  Mobility: Bed Mobility Overal bed mobility: Needs Assistance Bed Mobility: Supine to Sit Supine to sit: Min guard General bed mobility comments: min guard for safety/balance Transfers Overall transfer level: Needs assistance Equipment used: 1 person hand held assist Transfers: Sit to/from Stand Sit to Stand: Min assist General transfer comment: assist to bring hips up and for balance Ambulation/Gait Ambulation/Gait assistance: Min assist, +2 safety/equipment Gait Distance (Feet): 100 Feet Assistive device: 1 person hand held assist Gait Pattern/deviations: Step-through pattern,  Decreased stride length, Wide base of support(incr lateral trunk sway) General Gait Details: assist for balance and support. Foley cath discomfort may have contributed to pt's wide base Gait velocity: decr Gait velocity interpretation: 1.31 - 2.62 ft/sec, indicative of limited community ambulator  ADL: ADL Overall ADL's : Needs assistance/impaired Grooming: Minimal assistance, Standing Upper Body Bathing: Minimal assistance, Sitting Lower Body Bathing: Minimal assistance, Sit to/from stand Upper Body Dressing : Sitting, Minimal assistance Lower Body Dressing: Minimal assistance, Sit to/from stand Toilet Transfer: Minimal assistance, Ambulation Toilet Transfer Details (indicate cue type and reason): simulated in room Functional mobility during ADLs: Minimal assistance, Cueing for safety General ADL Comments: pt limited by impaired balance, decreased activity tolerance, generalized weakness and impaired cognition   Cognition: Cognition Overall Cognitive Status: Impaired/Different from baseline Orientation Level: Oriented X4 Cognition Arousal/Alertness: Awake/alert Behavior During Therapy: Flat affect Overall Cognitive Status: Impaired/Different from baseline Area of Impairment: Orientation, Attention, Following commands, Memory, Safety/judgement, Awareness, Problem solving Orientation Level: Disoriented to, Situation(able to report year, month with cueing ) Current Attention Level: Sustained Memory: Decreased recall of precautions, Decreased short-term memory Following Commands: Follows one step commands consistently, Follows one step commands with increased time, Follows multi-step commands inconsistently Safety/Judgement: Decreased awareness of safety, Decreased awareness of deficits Awareness: Intellectual Problem Solving: Slow processing, Decreased initiation, Difficulty  sequencing, Requires verbal cues, Requires tactile cues General Comments: pt pleasant and cooperative,  repetition of "how long have I been in here" and "I need to pee" during session; fair immediate recall of hoildays; increased time for processing but able to engage in habitual tasks with minimal cueing; labile but appropriately   Blood pressure (!) 156/62, pulse 74, temperature 98.4 F (36.9 C), temperature source Oral, resp. rate 18, height 5' 10"  (1.778 m), weight 104.8 kg, SpO2 96 %.  Physical Exam  Nursing note and vitals reviewed. Constitutional: He appears well-developed and well-nourished. No distress.  HENT:  Head: Normocephalic and atraumatic.  Mouth/Throat: Oropharynx is clear and moist.  Eyes: Pupils are equal, round, and reactive to light. Conjunctivae are normal.  Respiratory: No respiratory distress.  GI: He exhibits no distension. There is no abdominal tenderness.  Musculoskeletal:     Comments: RUE--tight with 2+ edema and foam dressing on biceps.   Strength appears intact throughout with 5/5 strength on testing with the exception of his right upper extremity which has limited hand grip (3/5) and EE, WF, and EF (4/5) Neurological: He is alert. AOx2. Unable to state date.  Speech clear. Has amnesia of events leading to admission. He was able to utilize calender for date independently and answer most basic orientation questions without difficulty. Able to follow 2 step commands.   Skin: He is not diaphoretic.  Psychiatric: Depressed, tearful about the impact his illness is having on his family.  Lab Results Last 24 Hours       Results for orders placed or performed during the hospital encounter of 04/02/19 (from the past 24 hour(s))  Glucose, capillary     Status: None   Collection Time: 04/06/19 11:44 AM  Result Value Ref Range   Glucose-Capillary 92 70 - 99 mg/dL  Glucose, capillary     Status: Abnormal   Collection Time: 04/06/19  3:46 PM  Result Value Ref Range   Glucose-Capillary 107 (H) 70 - 99 mg/dL  Hepatic function panel     Status: Abnormal    Collection Time: 04/07/19  5:28 AM  Result Value Ref Range   Total Protein 4.6 (L) 6.5 - 8.1 g/dL   Albumin 2.4 (L) 3.5 - 5.0 g/dL   AST 144 (H) 15 - 41 U/L   ALT 189 (H) 0 - 44 U/L   Alkaline Phosphatase 38 38 - 126 U/L   Total Bilirubin 0.8 0.3 - 1.2 mg/dL   Bilirubin, Direct 0.2 0.0 - 0.2 mg/dL   Indirect Bilirubin 0.6 0.3 - 0.9 mg/dL  Basic metabolic panel     Status: Abnormal   Collection Time: 04/07/19  5:28 AM  Result Value Ref Range   Sodium 143 135 - 145 mmol/L   Potassium 3.0 (L) 3.5 - 5.1 mmol/L   Chloride 105 98 - 111 mmol/L   CO2 29 22 - 32 mmol/L   Glucose, Bld 83 70 - 99 mg/dL   BUN 6 6 - 20 mg/dL   Creatinine, Ser 0.96 0.61 - 1.24 mg/dL   Calcium 7.1 (L) 8.9 - 10.3 mg/dL   GFR calc non Af Amer >60 >60 mL/min   GFR calc Af Amer >60 >60 mL/min   Anion gap 9 5 - 15  CBC     Status: Abnormal   Collection Time: 04/07/19  5:28 AM  Result Value Ref Range   WBC 7.1 4.0 - 10.5 K/uL   RBC 3.95 (L) 4.22 - 5.81 MIL/uL   Hemoglobin 11.4 (L) 13.0 -  17.0 g/dL   HCT 35.7 (L) 39.0 - 52.0 %   MCV 90.4 80.0 - 100.0 fL   MCH 28.9 26.0 - 34.0 pg   MCHC 31.9 30.0 - 36.0 g/dL   RDW 13.4 11.5 - 15.5 %   Platelets 263 150 - 400 K/uL   nRBC 0.0 0.0 - 0.2 %  CK     Status: Abnormal   Collection Time: 04/07/19  5:28 AM  Result Value Ref Range   Total CK 4,316 (H) 49 - 397 U/L  Glucose, capillary     Status: Abnormal   Collection Time: 04/07/19  7:44 AM  Result Value Ref Range   Glucose-Capillary 100 (H) 70 - 99 mg/dL      Imaging Results (Last 48 hours)  Vas [place] Upper Extremity Venous Duplex  Result Date: 04/06/2019 UPPER VENOUS STUDY  Indications: Swelling, and Erythema Risk Factors: Suspected overdose, patient down for at least 8 hours, rhabdomyolysis. Comparison Study: No prior study on file for comparison Performing Technologist: Sharion Dove RVS  Examination Guidelines: A complete evaluation includes B-mode imaging, spectral Doppler,  color Doppler, and power Doppler as needed of all accessible portions of each vessel. Bilateral testing is considered an integral part of a complete examination. Limited examinations for reoccurring indications may be performed as noted.  Right Findings: +----------+------------+---------+-----------+----------+---------------------+  RIGHT      Compressible Phasicity Spontaneous Properties        Summary         +----------+------------+---------+-----------+----------+---------------------+  IJV            Full        Yes        Yes                                       +----------+------------+---------+-----------+----------+---------------------+  Subclavian                 Yes        Yes                                       +----------+------------+---------+-----------+----------+---------------------+  Axillary                   Yes        Yes                                       +----------+------------+---------+-----------+----------+---------------------+  Brachial       Full        Yes        Yes                                       +----------+------------+---------+-----------+----------+---------------------+  Radial         Full                                                             +----------+------------+---------+-----------+----------+---------------------+  Ulnar  Not visualized      +----------+------------+---------+-----------+----------+---------------------+  Cephalic       Full                                                             +----------+------------+---------+-----------+----------+---------------------+  Basilic        None                                       Acute, localized to                                                                    [facility] only         +----------+------------+---------+-----------+----------+---------------------+  Left Findings:  +----------+------------+---------+-----------+----------+-------+  LEFT       Compressible Phasicity Spontaneous Properties Summary  +----------+------------+---------+-----------+----------+-------+  Subclavian                 Yes        Yes                         +----------+------------+---------+-----------+----------+-------+  Summary:  Right: No evidence of deep vein thrombosis in the upper extremity. Findings consistent with acute superficial vein thrombosis involving the right basilic vein.  Left: No evidence of thrombosis in the subclavian.  *See table(s) above for measurements and observations.  Diagnosing physician: Curt Jews MD Electronically signed by Curt Jews MD on 04/06/2019 at 37:48:16 AM.    Final    [place] Ekg Site Rite  Result Date: 04/06/2019 If Site Rite image not attached, placement could not be confirmed due to current cardiac rhythm.     Assessment/Plan: Diagnosis: Impaired mobility and ADLs following fentanyl overdose 1. Does the need for close, 24 hr/day medical supervision in concert with the patient's rehab needs make it unreasonable for this patient to be served in a less intensive setting? Yes 2. Co-Morbidities requiring supervision/potential complications: depression, polysubstance abuse, hypogonadism       3. Due to safety, disease management, medication administration and patient education, does the patient require 24 hr/day rehab nursing? Yes 4. Does the patient require coordinated care of a physician, rehab nurse, therapy disciplines of PT, OT, SLP to address physical and functional deficits in the context of the above medical diagnosis(es)? Yes Addressing deficits in the following areas: balance, endurance, locomotion, strength, bathing, dressing, feeding, grooming, cognition and psychosocial support 5. Can the patient actively participate in an intensive therapy program of at least 3 hrs of therapy per day at least 5 days per week? Yes 6. The potential for  patient to make measurable gains while on inpatient rehab is excellent 7. Anticipated functional outcomes upon discharge from inpatient rehab are modified independent  with PT, modified independent with OT, independent with SLP. 8. Estimated rehab length of stay to reach the above functional goals is: 10-14 days 9. Anticipated discharge destination: Home 10. Overall Rehab/Functional Prognosis: excellent  RECOMMENDATIONS: This patient's condition is appropriate for continued  rehabilitative care in the following setting: CIR Patient has agreed to participate in recommended program. Yes Note that insurance prior authorization may be required for reimbursement for recommended care.  Comment: Brandon Thomas has impaired mobility, balance, right upper extremity strength, and cognitive deficits and would benefit from inpatient rehabilitation to maximize his safety and independence at home. He would be able to tolerate 3 hours of daily therapy.   Bary Leriche, PA-C 04/07/2019   I have personally performed a face to face diagnostic evaluation, including, but not limited to relevant history and physical exam findings, of this patient and developed relevant assessment and plan.  Additionally, I have reviewed and concur with the physician assistant's documentation above.  Leeroy Cha, MD        Revision History Date/Time User Provider Type Action  04/07/2019 11:20 AM Ranell Patrick, Clide Deutscher, MD Physician Sign  04/07/2019 9:56 AM Love, Ivan Anchors, PA-C Physician Assistant Share  View Details Report     Routing History

## 2019-04-09 NOTE — Discharge Summary (Signed)
Physician Discharge Summary  Brandon Thomas:[id number] DOB: 1975/10/02 DOA: 04/02/2019  PCP: Brandon Pacas, DO  Admit date: 04/02/2019 Discharge date: 04/09/2019  Admitted From: Home Disposition: CIR  Recommendations for Outpatient Follow-up:  1. Follow up with PCP in 1-2 weeks 2. Please obtain BMP in 2-3 days to assess potassium level, also obtain magnesium level 3. Continue antibiotics with Keflex 500 mg p.o. twice daily for additional 3 days to start on 04/10/2023 cellulitis  Home Health: No Equipment/Devices: None  Discharge Condition: Stable CODE STATUS: Full code Diet recommendation: Heart healthy  History of present illness:  Brandon Thomas is a 43 year old male with PMH significant for polysubstance abuse found unresponsive at home by wife; with suspected drug overdose. Developed possible seizure like activity with EMS. Required intubation in ER.  Noted with Aspiration pneumonia, AKI, rhabdo, hyperkalemia, transaminitis, and hypotension. PCCM called for admit.MRI was performed and demonstrated CVA involving the bilateral hippocampus. EEG was performed and demonstrated diffuse encephalopathy, but no seizure activity. Doppler of the upper extremities was performed on 04/05/2019 which demonstrated superficial thrombosis of the basilic vein in the right upper extremity. He has grown Coagulase negative staph from 1/2 blood cultures obtained on 04/03/2019. Surveillance cultures were drawn on 04/05/2019. They have had no growth. CK is down to 4,000 from 27,261 on admission. LFT's have been elevated, but are resolving. Probably due to shock liver related to drug overdose.    Discharging to CIR today.  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  Also history of cocaine abuse.  Continues to have short-term memory deficit.  EEG consistent with generalized  slowing, without seizures or epileptiform discharges.  Neurology was consulted, recommendations of supportive care with therapy.  Discharging to CIR for further rehabilitation today.  Continue aspirin 81 mg p.o. daily.  Rhabdomyolysis: due to prolonged immobilization: CK level 27,000 trended down to 4,316.  Continue mobilization.  Elevated transaminases likely secondary to shock liver Elevated troponin likely secondary to demand ischemia from prolonged downtime All results and from overdose.  LFTs improving.  Denies chest pain.  Supportive care.  MSSA pneumonia: Started on vancomycin, which was changed to Ancef on 04/07/2019, completed 7-day course.  Hypokalemia Potassium 3.0 today, will replete.  Recommend repeat BMP with magnesium level in 2-3 days.  Right basilic vein superficial thrombosis:  Patient with significant edema right upper extremity in comparison with his left upper extremity.  Patient has had 2 vascular duplex ultrasounds of his right upper extremity with findings of a right basilic vein superficial thrombosis, no DVTs appreciated.  Continue aspirin 81 mg p.o. daily.  Warm compresses and keep arm elevated.  Coagulase negative staph 1 out of 2 blood cultures positive from 11/26 Likely contaminant. Repeat blood cultures for surveillance 11/28 no growth to date.  Echocardiogram negative for vegetation  Cellulitis of the chest wall and right upper extremity:  Completed a 7-day course of IV antibiotics with vancomycin which was transitioned to cefazolin.  Will continue an additional 3 days of oral antibiotics with Keflex 500 mg p.o. twice daily, to start on 04/10/2019.  Hypertension:  Blood pressure 128/77 this morning, well controlled.  Continue Norvasc 2.5 mg p.o. daily.  obesity Estimated body mass index is 33.15 kg/m as calculated from the following:   Height as of this encounter: 5' 10"  (1.778 m).   Weight as of this encounter: 104.8 kg.  Discharge Diagnoses:   Active Problems:  CVA (cerebral vascular accident) ([place])   Cellulitis   Superficial venous thrombosis of arm, right   Edema of right upper arm   Substance abuse [facility])    Discharge Instructions  Discharge Instructions    Call MD for:  difficulty breathing, headache or visual disturbances   Complete by: As directed    Call MD for:  extreme fatigue   Complete by: As directed    Call MD for:  persistant dizziness or light-headedness   Complete by: As directed    Call MD for:  persistant nausea and vomiting   Complete by: As directed    Call MD for:  severe uncontrolled pain   Complete by: As directed    Call MD for:  temperature >100.4   Complete by: As directed    Diet - low sodium heart healthy   Complete by: As directed    Increase activity slowly   Complete by: As directed      Allergies as of 04/09/2019      Reactions   Atorvastatin    RUQ pain   Crestor [rosuvastatin]    Elevated LFTs      Medication List    STOP taking these medications   cetirizine 10 MG tablet Commonly known as: ZYRTEC   diltiazem 2 % Gel   Na Sulfate-K Sulfate-Mg Sulf 17.5-3.13-1.6 GM/177ML Soln   NEEDLE (DISP) 22 G 22G X 1-1/2" Misc   oxymetazoline 0.05 % nasal spray Commonly known as: AFRIN   pravastatin 20 MG tablet Commonly known as: PRAVACHOL   sertraline 100 MG tablet Commonly known as: ZOLOFT   sodium chloride 0.65 % Soln nasal spray Commonly known as: IT sales professional (Disposable) 3 ML Misc   testosterone cypionate 200 MG/ML injection Commonly known as: DEPOTESTOSTERONE CYPIONATE   tetrahydrozoline-zinc 0.05-0.25 % ophthalmic solution Commonly known as: VISINE-AC     TAKE these medications   amLODipine 2.5 MG tablet Commonly known as: NORVASC Take 1 tablet (2.5 mg total) by mouth daily. Start taking on: April 10, 2019   aspirin 81 MG EC tablet Take 1 tablet (81 mg total) by mouth daily. Start taking on: April 10, 2019   cephALEXin 500 MG  capsule Commonly known as: KEFLEX Take 1 capsule (500 mg total) by mouth 2 (two) times daily for 3 days. Start taking on: April 10, 2019   pantoprazole 40 MG tablet Commonly known as: PROTONIX Take 1 tablet (40 mg total) by mouth daily. Start taking on: April 10, 2019       Allergies  Allergen Reactions  . Atorvastatin     RUQ pain  . Crestor [Rosuvastatin]     Elevated LFTs    Consultations:  PCCM  Neurology   Procedures/Studies: Ct Head Wo Contrast  Result Date: 04/03/2019 CLINICAL DATA:  Overdose.  Found on floor. EXAM: CT HEAD WITHOUT CONTRAST TECHNIQUE: Contiguous axial images were obtained from the base of the skull through the vertex without intravenous contrast. COMPARISON:  CT head 04/02/2019 FINDINGS: Brain: No evidence of acute infarction, hemorrhage, hydrocephalus, extra-axial collection or mass lesion/mass effect. No cerebral edema. Gray-white junction appears preserved. Artifact from EEG leads. Vascular: Negative for hyperdense vessel Skull: Negative Sinuses/Orbits: Negative Other: None IMPRESSION: Negative CT head.  Negative for brain edema or hemorrhage. Electronically Signed   By: Franchot Gallo M.D.   On: 04/03/2019 09:02   Ct Head Wo Contrast  Result Date: 04/02/2019 CLINICAL DATA:  Overdose EXAM: CT HEAD WITHOUT CONTRAST CT CERVICAL SPINE WITHOUT CONTRAST TECHNIQUE: Multidetector  CT imaging of the head and cervical spine was performed following the standard protocol without intravenous contrast. Multiplanar CT image reconstructions of the cervical spine were also generated. COMPARISON:  None. FINDINGS: CT HEAD FINDINGS Brain: Preservation of the gray-white differentiation. No convincing features global cerebral edema or diffuse anoxic injury. No evidence of acute infarction, hemorrhage, hydrocephalus, extra-axial collection or mass lesion/mass effect. Vascular: Atherosclerotic calcification seen in the carotid siphons. No unexpected hyperdense vessel.  Skull: Mild right frontal and supraorbital scalp swelling without subjacent calvarial fracture. No suspicious osseous lesions. Sinuses/Orbits: Minimal mural thickening in the left maxillary sinus. Remaining paranasal sinuses and mastoids are predominantly clear. Included orbital structures are unremarkable. Other: Transesophageal and endotracheal tubes in place. CT CERVICAL SPINE FINDINGS Alignment: Imaging quality of the cervical spine degraded by motion artifact. Preservation of the normal cervical lordosis without gross traumatic listhesis. No abnormal facet widening. Normal alignment of the craniocervical and atlantoaxial articulations. Skull base and vertebrae: Motion artifact may limit detection of subtle, nondisplaced fractures. No definite acute fracture or suspicious osseous lesion. Soft tissues and spinal canal: No pre or paravertebral fluid or swelling. No visible canal hematoma. Disc levels: Minimal cervical spondylitic changes maximally at C5-6 and C6-7 with small posterior disc osteophyte complexes resulting in mild canal stenosis and uncinate spurring with hypertrophic facet changes at C6-7 resulting in mild to moderate bilateral foraminal narrowing. No severe canal or foraminal stenosis in the cervical spine Upper chest: Atelectatic changes noted dependently in the lungs. Other: None IMPRESSION: 1. No acute intracranial abnormality. 2. Mild right frontal and supraorbital scalp swelling without subjacent calvarial fracture. 3. Motion artifact degrades of cervical spine imaging quality. May limit detection of subtle, nondisplaced fractures. No definite acute cervical spine fracture or traumatic listhesis. 4. Minimal cervical spondylitic changes, as above. Electronically Signed   By: Lovena Le M.D.   On: 04/02/2019 22:42   Ct Chest Wo Contrast  Result Date: 04/02/2019 CLINICAL DATA:  Overdose EXAM: CT CHEST WITHOUT CONTRAST TECHNIQUE: Multidetector CT imaging of the chest was performed following  the standard protocol without IV contrast. COMPARISON:  Radiograph 04/02/2019 FINDINGS: Cardiovascular: Normal heart size. No pericardial effusion. Normal central pulmonary arterial caliber. Normal caliber of the thoracic aorta. Normal 3 vessel branching of the aortic arch. Mediastinum/Nodes: Endotracheal tube terminates 3.2 cm from the carina. A transesophageal tube tip terminates within the gastric lumen with the side port distal to the GE junction. No acute trachea or esophageal abnormality. No mediastinal or axillary adenopathy. Hilar lymph nodes are difficult to assess in the absence of contrast media. No mediastinal hemorrhage or gas. Thyroid gland and thoracic inlet are unremarkable. Lungs/Pleura: Extensive areas of consolidation and volume loss seen posteriorly within the lungs with some air bronchograms. Some secretions are noted distal to the endotracheal balloon. No pneumothorax. No convincing effusion. No suspicious nodules or masses. Upper Abdomen: No acute abnormalities present in the visualized portions of the upper abdomen. Musculoskeletal: No chest wall mass or suspicious bone lesions identified. IMPRESSION: 1. Extensive areas of consolidation and volume loss posteriorly within the lungs with some air bronchograms, findings may reflect marked hypoventilatory changes in the setting of overdose though some underlying infection and/or sequela of aspiration may be present as well particularly given the airways thickening and secretions below the endotracheal balloon. 2. Endotracheal tube terminates 3.2 cm from the carina. 3. Transesophageal tube tip terminates within the gastric lumen with the side port distal to the GE junction. Electronically Signed   By: Elwin Sleight.D.  On: 04/02/2019 22:46   Ct Cervical Spine Wo Contrast  Result Date: 04/02/2019 CLINICAL DATA:  Overdose EXAM: CT HEAD WITHOUT CONTRAST CT CERVICAL SPINE WITHOUT CONTRAST TECHNIQUE: Multidetector CT imaging of the head and  cervical spine was performed following the standard protocol without intravenous contrast. Multiplanar CT image reconstructions of the cervical spine were also generated. COMPARISON:  None. FINDINGS: CT HEAD FINDINGS Brain: Preservation of the gray-white differentiation. No convincing features global cerebral edema or diffuse anoxic injury. No evidence of acute infarction, hemorrhage, hydrocephalus, extra-axial collection or mass lesion/mass effect. Vascular: Atherosclerotic calcification seen in the carotid siphons. No unexpected hyperdense vessel. Skull: Mild right frontal and supraorbital scalp swelling without subjacent calvarial fracture. No suspicious osseous lesions. Sinuses/Orbits: Minimal mural thickening in the left maxillary sinus. Remaining paranasal sinuses and mastoids are predominantly clear. Included orbital structures are unremarkable. Other: Transesophageal and endotracheal tubes in place. CT CERVICAL SPINE FINDINGS Alignment: Imaging quality of the cervical spine degraded by motion artifact. Preservation of the normal cervical lordosis without gross traumatic listhesis. No abnormal facet widening. Normal alignment of the craniocervical and atlantoaxial articulations. Skull base and vertebrae: Motion artifact may limit detection of subtle, nondisplaced fractures. No definite acute fracture or suspicious osseous lesion. Soft tissues and spinal canal: No pre or paravertebral fluid or swelling. No visible canal hematoma. Disc levels: Minimal cervical spondylitic changes maximally at C5-6 and C6-7 with small posterior disc osteophyte complexes resulting in mild canal stenosis and uncinate spurring with hypertrophic facet changes at C6-7 resulting in mild to moderate bilateral foraminal narrowing. No severe canal or foraminal stenosis in the cervical spine Upper chest: Atelectatic changes noted dependently in the lungs. Other: None IMPRESSION: 1. No acute intracranial abnormality. 2. Mild right frontal  and supraorbital scalp swelling without subjacent calvarial fracture. 3. Motion artifact degrades of cervical spine imaging quality. May limit detection of subtle, nondisplaced fractures. No definite acute cervical spine fracture or traumatic listhesis. 4. Minimal cervical spondylitic changes, as above. Electronically Signed   By: Lovena Le M.D.   On: 04/02/2019 22:42   Mr Brain Wo Contrast  Result Date: 04/05/2019 CLINICAL DATA:  Encephalopathy. Anterograde amnesia. History of polysubstance abuse. Found down. Clinical concern for hippocampal injury associated with fentanyl. EXAM: MRI HEAD WITHOUT CONTRAST TECHNIQUE: Multiplanar, multiecho pulse sequences of the brain and surrounding structures were obtained without intravenous contrast. COMPARISON:  Head CT 04/03/2019 FINDINGS: The patient was unable to tolerate the complete examination. Axial and coronal diffusion, susceptibility weighted imaging, sagittal T1, axial T2, and axial FLAIR sequences were obtained with some being severely motion degraded. Brain: There is prominent restricted diffusion and T2 hyperintensity diffusely throughout the hippocampi bilaterally. No restricted diffusion is present elsewhere. No gross intracranial hemorrhage, intracranial mass effect, or extra-axial fluid collection is identified. The ventricles and sulci are normal in size. Vascular: Major intracranial vascular flow voids are grossly preserved. Skull and upper cervical spine: Unremarkable bone marrow signal. Sinuses/Orbits: Grossly unremarkable orbits. Grossly clear sinuses. Other: None. IMPRESSION: 1. Severely motion degraded, incomplete examination. 2. Restricted diffusion diffusely throughout the hippocampi likely reflecting acute injury from toxic exposure given clinical history. Electronically Signed   By: Logan Bores M.D.   On: 04/05/2019 06:33   Dg Pelvis Portable  Result Date: 04/02/2019 CLINICAL DATA:  Found down, possible seizure. EXAM: PORTABLE PELVIS  1-2 VIEWS COMPARISON:  None. FINDINGS: The cortical margins of the bony pelvis are intact. No fracture. Pubic symphysis and sacroiliac joints are congruent. Both femoral heads are well-seated in the respective acetabula. Rectal  tube/temperature probe projects centrally over the pelvis. IMPRESSION: Unremarkable radiograph of the pelvis. Electronically Signed   By: Keith Rake M.D.   On: 04/02/2019 21:38   Dg Chest Port 1 View  Result Date: 04/04/2019 CLINICAL DATA:  Acute respiratory failure. EXAM: PORTABLE CHEST 1 VIEW COMPARISON:  04/03/2022; 04/03/2019; 04/02/2019; chest CT-04/02/2019 FINDINGS: Grossly unchanged cardiac silhouette and mediastinal contours. Stable positioning of support apparatus. No pneumothorax. Minimally improved aeration of the lungs with persistent bibasilar heterogeneous opacities, left greater than right. No new focal airspace opacities. No pleural effusion or pneumothorax no evidence of edema. No acute osseous abnormalities. Enteric contrast remains in the gastric fundus. A bone island is seen within the left humeral head. IMPRESSION: 1.  Stable positioning of support apparatus.  No pneumothorax. 2. Slightly improved aeration of lungs with persistent bibasilar opacities, left greater than right, atelectasis versus infiltrate/aspiration. Electronically Signed   By: Sandi Mariscal M.D.   On: 04/04/2019 07:54   Dg Chest Port 1 View  Result Date: 04/04/2019 CLINICAL DATA:  Respiratory failure, endotracheal tube EXAM: PORTABLE CHEST 1 VIEW COMPARISON:  Radiograph 04/03/2019 FINDINGS: Endotracheal tube in the mid trachea, 4.6 cm from the carina. Transesophageal tube tip and side port distal to the GE junction. Slightly diminished lung volumes with increasing basilar opacities . No visible pneumothorax or effusion. No acute osseous or soft tissue abnormality. IMPRESSION: 1. Endotracheal tube 4.6 cm from the carina.Transesophageal tube tip and side port distal to the GE junction. 2.  Worsening bibasilar opacities; possibly atelectasis versus pneumonia or aspiration. Electronically Signed   By: Lovena Le M.D.   On: 04/04/2019 05:22   Dg Chest Port 1 View  Result Date: 04/03/2019 CLINICAL DATA:  Acute respiratory failure EXAM: PORTABLE CHEST 1 VIEW COMPARISON:  04/02/2019 FINDINGS: Endotracheal tube in good position.  NG tube in the stomach. Progression of left lower lobe airspace disease. Slight right lower lobe airspace disease. No edema or pneumothorax. No significant effusion. IMPRESSION: Endotracheal tube in good position. Progressive left lower lobe airspace disease which could be atelectasis or pneumonia. Electronically Signed   By: Franchot Gallo M.D.   On: 04/03/2019 06:36   Dg Chest Port 1 View  Result Date: 04/02/2019 CLINICAL DATA:  Verify endotracheal and OG tube placement. Found down. Possible seizure. EXAM: PORTABLE CHEST 1 VIEW COMPARISON:  None. FINDINGS: Endotracheal tube tip 4.6 cm from the carina at the thoracic inlet. Tip and side port of the enteric tube below the diaphragm in the stomach. Lung volumes are low. Upper normal heart size, likely accentuated by technique. Mild patchy bilateral perihilar opacities, left greater than right. No large pleural effusion or pneumothorax. No acute osseous abnormalities are seen. IMPRESSION: 1. Endotracheal tube tip 4.6 cm from the carina at the thoracic inlet. 2. Enteric tube tip and side port below the diaphragm in the stomach. 3. Low lung volumes with mild patchy bilateral perihilar opacities, left greater than right. Findings may be atelectasis, pneumonia, or aspiration. Electronically Signed   By: Keith Rake M.D.   On: 04/02/2019 21:39   Vas [place] Upper Extremity Venous Duplex  Result Date: 04/08/2019 UPPER VENOUS STUDY  Indications: Swelling Limitations: Body habitus. Comparison Study: 04/06/19 Performing Technologist: [place], RVT  Examination Guidelines: A complete evaluation includes B-mode  imaging, spectral Doppler, color Doppler, and power Doppler as needed of all accessible portions of each vessel. Bilateral testing is considered an integral part of a complete examination. Limited examinations for reoccurring indications may be performed as noted.  Right  Findings: +----------+------------+---------+-----------+--------------+----------------+ RIGHT     CompressiblePhasicitySpontaneous  Properties      Summary      +----------+------------+---------+-----------+--------------+----------------+ IJV           Full       Yes       Yes                                   +----------+------------+---------+-----------+--------------+----------------+ Subclavian    Full       Yes       Yes                                   +----------+------------+---------+-----------+--------------+----------------+ Axillary      Full       Yes       Yes                                   +----------+------------+---------+-----------+--------------+----------------+ Brachial      Full       Yes       Yes                                   +----------+------------+---------+-----------+--------------+----------------+ Radial        Full                                                       +----------+------------+---------+-----------+--------------+----------------+ Ulnar         Full                                                       +----------+------------+---------+-----------+--------------+----------------+ Cephalic      Full                                                       +----------+------------+---------+-----------+--------------+----------------+ Basilic     Partial                           softly          Age                                                    echogenic    Indeterminate   +----------+------------+---------+-----------+--------------+----------------+  Left Findings:  +----------+------------+---------+-----------+----------+----------+ LEFT      CompressiblePhasicitySpontaneousProperties Summary   +----------+------------+---------+-----------+----------+----------+ Subclavian    Full       Yes       Yes              PICC noted +----------+------------+---------+-----------+----------+----------+  Summary:  Right: No evidence of  deep vein thrombosis in the upper extremity. Findings consistent with age indeterminate superficial vein thrombosis involving the right basilic vein. SVT appears unchanged from prior study, 04/06/19.  Left: No evidence of thrombosis in the subclavian.  *See table(s) above for measurements and observations.  Diagnosing physician: Deitra Mayo MD Electronically signed by Deitra Mayo MD on 04/08/2019 at 5:42:00 PM.    Final    Vas [place] Upper Extremity Venous Duplex  Result Date: 04/06/2019 UPPER VENOUS STUDY  Indications: Swelling, and Erythema Risk Factors: Suspected overdose, patient down for at least 8 hours, rhabdomyolysis. Comparison Study: No prior study on file for comparison Performing Technologist: Sharion Dove RVS  Examination Guidelines: A complete evaluation includes B-mode imaging, spectral Doppler, color Doppler, and power Doppler as needed of all accessible portions of each vessel. Bilateral testing is considered an integral part of a complete examination. Limited examinations for reoccurring indications may be performed as noted.  Right Findings: +----------+------------+---------+-----------+----------+---------------------+ RIGHT     CompressiblePhasicitySpontaneousProperties       Summary        +----------+------------+---------+-----------+----------+---------------------+ IJV           Full       Yes       Yes                                    +----------+------------+---------+-----------+----------+---------------------+ Subclavian               Yes       Yes                                     +----------+------------+---------+-----------+----------+---------------------+ Axillary                 Yes       Yes                                    +----------+------------+---------+-----------+----------+---------------------+ Brachial      Full       Yes       Yes                                    +----------+------------+---------+-----------+----------+---------------------+ Radial        Full                                                        +----------+------------+---------+-----------+----------+---------------------+ Ulnar                                                  Not visualized     +----------+------------+---------+-----------+----------+---------------------+ Cephalic      Full                                                        +----------+------------+---------+-----------+----------+---------------------+  Basilic       None                                   Acute, localized to                                                             [facility] only        +----------+------------+---------+-----------+----------+---------------------+  Left Findings: +----------+------------+---------+-----------+----------+-------+ LEFT      CompressiblePhasicitySpontaneousPropertiesSummary +----------+------------+---------+-----------+----------+-------+ Subclavian               Yes       Yes                      +----------+------------+---------+-----------+----------+-------+  Summary:  Right: No evidence of deep vein thrombosis in the upper extremity. Findings consistent with acute superficial vein thrombosis involving the right basilic vein.  Left: No evidence of thrombosis in the subclavian.  *See table(s) above for measurements and observations.  Diagnosing physician: Curt Jews MD Electronically signed by Curt Jews MD on 04/06/2019 at 59:48:16 AM.    Final    [place] Ekg Site Rite  Result Date: 04/06/2019 If Site  Rite image not attached, placement could not be confirmed due to current cardiac rhythm.    Transthoracic echocardiogram 04/05/2019: IMPRESSIONS    1. Left ventricular ejection fraction, by visual estimation, is 60 to 65%. The left ventricle has normal function. There is no left ventricular hypertrophy.  2. Left ventricular diastolic function could not be evaluated.  3. Global right ventricle has normal systolic function.The right ventricular size is normal. No increase in right ventricular wall thickness.  4. Left atrial size was normal.  5. Right atrial size was normal.  6. The mitral valve is normal in structure. Trace mitral valve regurgitation. No evidence of mitral stenosis.  7. The tricuspid valve is normal in structure. Tricuspid valve regurgitation is not demonstrated.  8. The aortic valve is tricuspid. Aortic valve regurgitation is trivial. Mild aortic valve sclerosis without stenosis.  9. The pulmonic valve was normal in structure. Pulmonic valve regurgitation is trivial. 10. TR signal is inadequate for assessing pulmonary artery systolic pressure. 11. The inferior vena cava is normal in size with greater than 50% respiratory variability, suggesting right atrial pressure of 3 mmHg.  Subjective: Patient seen and examined at bedside, resting comfortably.  No specific complaints this morning.  Was notified by CIR with approved insurance authorization and bed ready today.  Patient obviously continues with short-term memory deficits, as went back into the room roughly 20 minutes later, and he did not recognize me from earlier this morning.  He continued to have no specific complaints other than continued right upper extremity edema, which has been stable.  Denies headache, no fever/chills/night sweats, no nausea/vomiting/diarrhea, no chest pain, no palpitations, no shortness of breath, no abdominal pain, no cough/congestion.  No acute events overnight per nursing staff.   Discharge  Exam: Vitals:   04/08/19 2033 04/09/19 0353  BP: 136/78 128/77  Pulse: 75 72  Resp: 18 18  Temp: 99.3 F (37.4 C) 98.4 F (36.9 C)  SpO2: 97% 98%   Vitals:   04/08/19 1436 04/08/19 1814 04/08/19 2033 04/09/19 0353  BP:  129/62 140/77 136/78 128/77  Pulse: 87 90 75 72  Resp: 18 18 18 18   Temp: 99 F (37.2 C) 98.9 F (37.2 C) 99.3 F (37.4 C) 98.4 F (36.9 C)  TempSrc: Axillary Oral Oral Oral  SpO2: 97% 95% 97% 98%  Weight:      Height:        General: Pt is alert, awake, not in acute distress Cardiovascular: RRR, S1/S2 +, no rubs, no gallops Respiratory: CTA bilaterally, no wheezing, no rhonchi Abdominal: Soft, NT, ND, bowel sounds + Extremities: Right upper extremity with edema to mid humerus; normal pulses and neurovascular intact    The results of significant diagnostics from this hospitalization (including imaging, microbiology, ancillary and laboratory) are listed below for reference.     Microbiology: Recent Results (from the past 240 hour(s))  Blood Culture (routine x 2)     Status: Abnormal   Collection Time: 04/02/19  9:00 PM   Specimen: BLOOD  Result Value Ref Range Status   Specimen Description BLOOD RIGHT ANTECUBITAL  Final   Special Requests   Final    BOTTLES DRAWN AEROBIC AND ANAEROBIC Blood Culture adequate volume   Culture  Setup Time   Final    AEROBIC BOTTLE ONLY GRAM POSITIVE COCCI CRITICAL RESULT CALLED TO, READ BACK BY AND VERIFIED WITH: L CURRAN PHARMD 04/03/19 2102 JDW    Culture (A)  Final    STAPHYLOCOCCUS SPECIES (COAGULASE NEGATIVE) THE SIGNIFICANCE OF ISOLATING THIS ORGANISM FROM A SINGLE SET OF BLOOD CULTURES WHEN MULTIPLE SETS ARE DRAWN IS UNCERTAIN. PLEASE NOTIFY THE MICROBIOLOGY DEPARTMENT WITHIN ONE WEEK IF SPECIATION AND SENSITIVITIES ARE REQUIRED. Performed at [facility], [place] [address] [place], [place] [address]    Report Status 04/04/2019 FINAL  Final  SARS Coronavirus 2 by RT PCR (hospital order, performed in [facility] hospital lab) Nasopharyngeal Nasopharyngeal Swab     Status: None   Collection Time: 04/02/19 10:37 PM   Specimen: Nasopharyngeal Swab  Result Value Ref Range Status   SARS Coronavirus 2 NEGATIVE NEGATIVE Final    Comment: (NOTE) SARS-CoV-2 target nucleic acids are NOT DETECTED. The SARS-CoV-2 RNA is generally detectable in upper and lower respiratory specimens during the acute phase of infection. The lowest concentration of SARS-CoV-2 viral copies this assay can detect is 250 copies / mL. A negative result does not preclude SARS-CoV-2 infection and should not be used as the sole basis for treatment or other patient management decisions.  A negative result may occur with improper specimen collection / handling, submission of specimen other than nasopharyngeal swab, presence of viral mutation(s) within the areas targeted by this assay, and inadequate number of viral copies (<250 copies / mL). A negative result must be combined with clinical observations, patient history, and epidemiological information. Fact Sheet for Patients:   StrictlyIdeas.no Fact Sheet for Healthcare Providers: BankingDealers.co.za This test is not yet approved or cleared  by the [place] FDA and has been authorized for detection and/or diagnosis of SARS-CoV-2 by FDA under an Emergency Use Authorization (EUA).  This EUA will remain in effect (meaning this test can be used) for the duration of the COVID-19 declaration under Section 564(b)(1) of the Act, 21 U.S.C. section 360bbb-3(b)(1), unless the authorization is terminated or revoked sooner. Performed at [facility], [place] [address] [place], [place] [address]   Blood Culture (routine x 2)     Status: None   Collection Time: 04/03/19 12:13 AM   Specimen: BLOOD RIGHT FOREARM  Result Value Ref Range Status   Specimen Description BLOOD RIGHT FOREARM  Final   Special Requests   Final    BOTTLES  DRAWN AEROBIC AND ANAEROBIC Blood Culture adequate volume   Culture   Final    NO GROWTH 5 DAYS Performed at [facility], [address] [place], [place] [address]    Report Status 04/08/2019 FINAL  Final  MRSA PCR Screening     Status: None   Collection Time: 04/03/19  5:36 AM  Result Value Ref Range Status   MRSA by PCR NEGATIVE NEGATIVE Final    Comment:        The GeneXpert MRSA Assay (FDA approved for NASAL specimens only), is one component of a comprehensive MRSA colonization surveillance program. It is not intended to diagnose MRSA infection nor to guide or monitor treatment for MRSA infections. Performed at [facility], [place] [address] [place], [place] [address]   Culture, respiratory (tracheal aspirate)     Status: None   Collection Time: 04/03/19  8:07 AM   Specimen: Tracheal Aspirate; Respiratory  Result Value Ref Range Status   Specimen Description TRACHEAL ASPIRATE  Final   Special Requests NONE  Final   Gram Stain   Final    RARE WBC PRESENT,BOTH PMN AND MONONUCLEAR RARE GRAM POSITIVE COCCI IN PAIRS    Culture   Final    FEW STAPHYLOCOCCUS AUREUS WITHIN MIXED ORGANISMS Performed at [facility], [place] [address], [place], [place] [address]    Report Status 04/07/2019 FINAL  Final   Organism ID, Bacteria STAPHYLOCOCCUS AUREUS  Final      Susceptibility   Staphylococcus aureus - MIC*    CIPROFLOXACIN <=0.5 SENSITIVE Sensitive     ERYTHROMYCIN <=0.25 SENSITIVE Sensitive     GENTAMICIN <=0.5 SENSITIVE Sensitive     OXACILLIN 0.5 SENSITIVE Sensitive     TETRACYCLINE <=1 SENSITIVE Sensitive     VANCOMYCIN <=0.5 SENSITIVE Sensitive     TRIMETH/SULFA <=10 SENSITIVE Sensitive     CLINDAMYCIN <=0.25 SENSITIVE Sensitive     RIFAMPIN <=0.5 SENSITIVE Sensitive     Inducible Clindamycin NEGATIVE Sensitive     * FEW STAPHYLOCOCCUS AUREUS  CSF culture     Status: None   Collection Time: 04/03/19 12:15 PM   Specimen: CSF; Cerebrospinal Fluid   Result Value Ref Range Status   Specimen Description CSF  Final   Special Requests NONE  Final   Gram Stain   Final    WBC PRESENT, PREDOMINANTLY MONONUCLEAR NO ORGANISMS SEEN CYTOSPIN SMEAR    Culture   Final    NO GROWTH 3 DAYS Performed at [facility], [place] [address], [place], [place] [address]    Report Status 04/06/2019 FINAL  Final  Culture, blood (routine x 2)     Status: None (Preliminary result)   Collection Time: 04/05/19 11:05 AM   Specimen: BLOOD LEFT HAND  Result Value Ref Range Status   Specimen Description BLOOD LEFT HAND  Final   Special Requests   Final    BOTTLES DRAWN AEROBIC AND ANAEROBIC Blood Culture adequate volume   Culture   Final    NO GROWTH 4 DAYS Performed at [facility], [place] [address] [place], [place] [address]    Report Status PENDING  Incomplete  Culture, blood (routine x 2)     Status: None (Preliminary result)   Collection Time: 04/05/19 12:45 PM   Specimen: BLOOD LEFT HAND  Result Value  Ref Range Status   Specimen Description BLOOD LEFT HAND  Final   Special Requests   Final    BOTTLES DRAWN AEROBIC ONLY Blood Culture adequate volume   Culture   Final    NO GROWTH 4 DAYS Performed at [facility], [address], [place], [place] [address]    Report Status PENDING  Incomplete     Labs: BNP (last 3 results) No results for input(s): BNP in the last 8760 hours. Basic Metabolic Panel: Recent Labs  Lab 04/03/19 0214  04/03/19 1849  04/04/19 0526  04/04/19 1648 04/05/19 0337 04/05/19 1105 04/06/19 0444 04/07/19 0528 04/08/19 0338 04/09/19 0408  NA  --    < >  --   --  145  --   --   --  146* 144 143 144 143  K  --    < > 4.3   < > 3.6   < > 4.4  --  3.7 3.3* 3.0* 2.9* 3.0*  CL  --    < >  --   --  111  --   --   --  107 108 105 107 105  CO2  --    < >  --   --  19*  --   --   --  26 26 29 25 26   GLUCOSE  --    < >  --   --  79  --   --   --  101* 100* 83 92 113*  BUN  --    < >  --   --  18  --   --   --   9 7 6  5* 9  CREATININE  --    < >  --   --  1.24  --   --   --  1.15 1.06 0.96 1.05 1.16  CALCIUM  --    < >  --   --  8.3*  --   --   --  9.1 8.5* 7.1* 8.7* 8.5*  MG 3.2*  --  1.9  --  2.0  --  1.9 1.9  --   --   --   --   --   PHOS 11.0*  --  1.6*  --  1.1*  --  3.0 3.1  --   --   --   --   --    < > = values in this interval not displayed.   Liver Function Tests: Recent Labs  Lab 04/05/19 0337 04/06/19 0444 04/07/19 0528 04/08/19 0338 04/09/19 0408  AST 491* 300* 144* 135* 102*  ALT 295* 276* 189* 207* 200*  ALKPHOS 41 43 38 47 51  BILITOT 0.7 0.8 0.8 0.8 0.3  PROT 5.7* 5.4* 4.6* 5.7* 5.5*  ALBUMIN 3.1* 2.7* 2.4* 3.1* 3.0*   No results for input(s): LIPASE, AMYLASE in the last 168 hours. Recent Labs  Lab 04/03/19 0323  AMMONIA 44*   CBC: Recent Labs  Lab 04/04/19 0645 04/05/19 1105 04/06/19 0444 04/07/19 0528 04/08/19 0338 04/09/19 0408  WBC 12.0* 10.1 9.4 7.1 9.4 9.0  NEUTROABS 7.5  --   --   --   --   --   HGB 13.2 12.3* 12.2* 11.4* 13.2 13.4  HCT 41.4 37.8* 38.0* 35.7* 41.1 42.4  MCV 92.4 89.8 90.5 90.4 89.9 90.8  PLT 332 293 284 263 329 323   Cardiac Enzymes: Recent Labs  Lab 04/03/19 0323 04/03/19 0605 04/04/19 1034 04/06/19  0444 04/07/19 0528  CKTOTAL 27,261* 75,170* 24,818* 12,146* 4,316*  CKMB  --   --  74.3*  --   --    BNP: Invalid input(s): POCBNP CBG: Recent Labs  Lab 04/08/19 1607 04/08/19 2035 04/08/19 2345 04/09/19 0355 04/09/19 0733  GLUCAP 96 97 94 109* 98   D-Dimer No results for input(s): DDIMER in the last 72 hours. Hgb A1c No results for input(s): HGBA1C in the last 72 hours. Lipid Profile No results for input(s): CHOL, HDL, LDLCALC, TRIG, CHOLHDL, LDLDIRECT in the last 72 hours. Thyroid function studies No results for input(s): TSH, T4TOTAL, T3FREE, THYROIDAB in the last 72 hours.  Invalid input(s): FREET3 Anemia work up No results for input(s): VITAMINB12, FOLATE, FERRITIN, TIBC, IRON, RETICCTPCT in the last 72  hours. Urinalysis    Component Value Date/Time   COLORURINE AMBER (A) 04/03/2019 0502   APPEARANCEUR CLOUDY (A) 04/03/2019 0502   LABSPEC 1.015 04/03/2019 0502   PHURINE 5.0 04/03/2019 0502   GLUCOSEU NEGATIVE 04/03/2019 0502   HGBUR LARGE (A) 04/03/2019 0502   BILIRUBINUR NEGATIVE 04/03/2019 0502   KETONESUR NEGATIVE 04/03/2019 0502   PROTEINUR 30 (A) 04/03/2019 0502   NITRITE NEGATIVE 04/03/2019 0502   LEUKOCYTESUR NEGATIVE 04/03/2019 0502   Sepsis Labs Invalid input(s): PROCALCITONIN,  WBC,  LACTICIDVEN Microbiology Recent Results (from the past 240 hour(s))  Blood Culture (routine x 2)     Status: Abnormal   Collection Time: 04/02/19  9:00 PM   Specimen: BLOOD  Result Value Ref Range Status   Specimen Description BLOOD RIGHT ANTECUBITAL  Final   Special Requests   Final    BOTTLES DRAWN AEROBIC AND ANAEROBIC Blood Culture adequate volume   Culture  Setup Time   Final    AEROBIC BOTTLE ONLY GRAM POSITIVE COCCI CRITICAL RESULT CALLED TO, READ BACK BY AND VERIFIED WITH: L CURRAN PHARMD 04/03/19 2102 JDW    Culture (A)  Final    STAPHYLOCOCCUS SPECIES (COAGULASE NEGATIVE) THE SIGNIFICANCE OF ISOLATING THIS ORGANISM FROM A SINGLE SET OF BLOOD CULTURES WHEN MULTIPLE SETS ARE DRAWN IS UNCERTAIN. PLEASE NOTIFY THE MICROBIOLOGY DEPARTMENT WITHIN ONE WEEK IF SPECIATION AND SENSITIVITIES ARE REQUIRED. Performed at [facility], [place] [address], [place], [place] [address]    Report Status 04/04/2019 FINAL  Final  SARS Coronavirus 2 by RT PCR (hospital order, performed in [facility] hospital lab) Nasopharyngeal Nasopharyngeal Swab     Status: None   Collection Time: 04/02/19 10:37 PM   Specimen: Nasopharyngeal Swab  Result Value Ref Range Status   SARS Coronavirus 2 NEGATIVE NEGATIVE Final    Comment: (NOTE) SARS-CoV-2 target nucleic acids are NOT DETECTED. The SARS-CoV-2 RNA is generally detectable in upper and lower respiratory specimens during the acute phase of  infection. The lowest concentration of SARS-CoV-2 viral copies this assay can detect is 250 copies / mL. A negative result does not preclude SARS-CoV-2 infection and should not be used as the sole basis for treatment or other patient management decisions.  A negative result may occur with improper specimen collection / handling, submission of specimen other than nasopharyngeal swab, presence of viral mutation(s) within the areas targeted by this assay, and inadequate number of viral copies (<250 copies / mL). A negative result must be combined with clinical observations, patient history, and epidemiological information. Fact Sheet for Patients:   StrictlyIdeas.no Fact Sheet for Healthcare Providers: BankingDealers.co.za This test is not yet approved or cleared  by the [place] FDA and has been authorized for detection  and/or diagnosis of SARS-CoV-2 by FDA under an Emergency Use Authorization (EUA).  This EUA will remain in effect (meaning this test can be used) for the duration of the COVID-19 declaration under Section 564(b)(1) of the Act, 21 U.S.C. section 360bbb-3(b)(1), unless the authorization is terminated or revoked sooner. Performed at [facility], [place] [address], [place], [place] [address]   Blood Culture (routine x 2)     Status: None   Collection Time: 04/03/19 12:13 AM   Specimen: BLOOD RIGHT FOREARM  Result Value Ref Range Status   Specimen Description BLOOD RIGHT FOREARM  Final   Special Requests   Final    BOTTLES DRAWN AEROBIC AND ANAEROBIC Blood Culture adequate volume   Culture   Final    NO GROWTH 5 DAYS Performed at [facility], [place] [address], [place], [place] [address]    Report Status 04/08/2019 FINAL  Final  MRSA PCR Screening     Status: None   Collection Time: 04/03/19  5:36 AM  Result Value Ref Range Status   MRSA by PCR NEGATIVE NEGATIVE Final    Comment:        The GeneXpert MRSA  Assay (FDA approved for NASAL specimens only), is one component of a comprehensive MRSA colonization surveillance program. It is not intended to diagnose MRSA infection nor to guide or monitor treatment for MRSA infections. Performed at [facility], [place] [address], [place], [place] [address]   Culture, respiratory (tracheal aspirate)     Status: None   Collection Time: 04/03/19  8:07 AM   Specimen: Tracheal Aspirate; Respiratory  Result Value Ref Range Status   Specimen Description TRACHEAL ASPIRATE  Final   Special Requests NONE  Final   Gram Stain   Final    RARE WBC PRESENT,BOTH PMN AND MONONUCLEAR RARE GRAM POSITIVE COCCI IN PAIRS    Culture   Final    FEW STAPHYLOCOCCUS AUREUS WITHIN MIXED ORGANISMS Performed at [facility], [place] [address] [place], [place] [address]    Report Status 04/07/2019 FINAL  Final   Organism ID, Bacteria STAPHYLOCOCCUS AUREUS  Final      Susceptibility   Staphylococcus aureus - MIC*    CIPROFLOXACIN <=0.5 SENSITIVE Sensitive     ERYTHROMYCIN <=0.25 SENSITIVE Sensitive     GENTAMICIN <=0.5 SENSITIVE Sensitive     OXACILLIN 0.5 SENSITIVE Sensitive     TETRACYCLINE <=1 SENSITIVE Sensitive     VANCOMYCIN <=0.5 SENSITIVE Sensitive     TRIMETH/SULFA <=10 SENSITIVE Sensitive     CLINDAMYCIN <=0.25 SENSITIVE Sensitive     RIFAMPIN <=0.5 SENSITIVE Sensitive     Inducible Clindamycin NEGATIVE Sensitive     * FEW STAPHYLOCOCCUS AUREUS  CSF culture     Status: None   Collection Time: 04/03/19 12:15 PM   Specimen: CSF; Cerebrospinal Fluid  Result Value Ref Range Status   Specimen Description CSF  Final   Special Requests NONE  Final   Gram Stain   Final    WBC PRESENT, PREDOMINANTLY MONONUCLEAR NO ORGANISMS SEEN CYTOSPIN SMEAR    Culture   Final    NO GROWTH 3 DAYS Performed at [facility], [place] [address] [place],  [address]    Report Status 04/06/2019 FINAL  Final  Culture, blood (routine x 2)     Status: None  (Preliminary result)   Collection Time: 04/05/19 11:05 AM   Specimen: BLOOD LEFT HAND  Result Value Ref Range Status  Specimen Description BLOOD LEFT HAND  Final   Special Requests   Final    BOTTLES DRAWN AEROBIC AND ANAEROBIC Blood Culture adequate volume   Culture   Final    NO GROWTH 4 DAYS Performed at [facility], [address], [place], [place] [address]    Report Status PENDING  Incomplete  Culture, blood (routine x 2)     Status: None (Preliminary result)   Collection Time: 04/05/19 12:45 PM   Specimen: BLOOD LEFT HAND  Result Value Ref Range Status   Specimen Description BLOOD LEFT HAND  Final   Special Requests   Final    BOTTLES DRAWN AEROBIC ONLY Blood Culture adequate volume   Culture   Final    NO GROWTH 4 DAYS Performed at [facility], [place] [address], [place], [place] [address]    Report Status PENDING  Incomplete     Time coordinating discharge: Over 30 minutes  SIGNED:   Kattleya Kuhnert J [place] DO  [facility] 04/09/2019, 10:12 AM

## 2019-04-09 NOTE — Progress Notes (Signed)
Inpatient Rehabilitation-Admissions Coordinator   I have insurance approval and medical approval from Dr. [place] for admit to [facility] today. Will meet with pt and his wife to review insurance benefit letter and consent form.   RN and [facility] team aware of plan for admit to CIR today.   Jhonnie Garner, OTR/L  Rehab Admissions Coordinator  [phone] 04/09/2019 10:16 AM

## 2019-04-09 NOTE — Progress Notes (Signed)
Brandon Ribas, MD  Physician  Physical Medicine and Rehabilitation  PMR Pre-admission  Signed  Date of Service:  04/08/2019 9:20 AM      Related encounter: ED to Hosp-Admission (Discharged) from 04/02/2019 in [place]         PMR Admission Coordinator Pre-Admission Assessment  Patient: Brandon Thomas is an 43 y.o., male MRN: [id number] DOB: 13-Sep-1975 Height: _0  (177.8 cm) Weight: 104.8 kg                                                                                                                                                  Insurance Information HMO:     PPO: yes     PCP:      IPA:      80/20:      OTHER:  PRIMARY: [employer] [place]      Policy#: [id number]      Subscriber: Patient CM Name: Faxed approval from utilization management team at [facility]     Phone#: [phone]     Fax#: [phone] Pre-Cert#: [id number]      Employer:  Josem Kaufmann ([id number]) provided by faxed approval for CIR. Pt is approved for 7 days of IP Rehab for DOS 04/08/19-04/15/19; updates due 04/14/19 or DC due 04/15/2019. (p): [phone]; (f): [phone] Benefits:  Phone #: [phone]     Name:  Eff. Date: 01/08/2019 - 07/06/2020     Deduct: $4,100 ($3,065.46 met)      Out of Pocket Max: $4,100 (includes deductible - $3,065.46 met)      Life Max:  CIR: after deductible, 100% coverage, 0% co-insurance      SNF: after deductible, 100% coverage, 0% co-insurance; with a limit of 60 days/cal yr Outpatient: $0/visit co-pay; limited to combined therapy limit of 20 visits PT/OT/ST     Home Health: after deductible, 100% coverage, 0% co-insurance; limited by medical necessity      DME: after deductible, 100% coverage, 0% co-insurance; pre-auth required if greater than $500       Providers:  SECONDARY: None      Policy#:       Subscriber:  CM Name:       Phone#:      Fax#:  Pre-Cert#:       Employer:  Benefits:  Phone #:       Name: Eff. Date:      Deduct:       Out of Pocket Max:       Life Max:  CIR:       SNF:  Outpatient:      Co-Pay:  Home Health:       Co-Pay:  DME:     Co-Pay:   Medicaid Application Date:       Case Manager:  Disability Application Date:  Case Worker:   The Data Collection Information Summary for patients in Inpatient Rehabilitation Facilities with attached Privacy Act [place] Records was provided and verbally reviewed with: N/A  Emergency Contact Information         Contact Information    Name Relation Home Work Mobile   Brandon Thomas Spouse [phone]       Current Medical History  Patient Admitting Diagnosis: Impaired mobility and ADLs following fentanyl overdose  History of Present Illness: Brandon Thomas a 43 y.o.malewith history of depression, polysubstance abuse (completed drug rehab but family reported recent behaviors consistent with relapse), hypogonadism (self treats with IM steroids) who was admitted on 04/03/19 found unresponsive with gurgling sounds by wife and CPR initiated. Patient treated with narcan without response and had decerebrate posturing with concerns of prolonged seizure with CK 9495, WBC- 22.5, shocked liver, was febrile with T-101.5, had lactic acidosis and UDS positive for benzo's. He was intubated for airway protection, started on broad spectrum antibiotics for sepsis due to aspiration PNA and/or RUE cellulitis, IVF for rhabdomyolysis and hypotension. Pt ended up completing 7 days of IV antibiotics and is to switch to oral Keflex.  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 Blood cultures negative. CSF negative for organisms and no growth. EEG showed severe diffuse encephalopathy.RUE dopplers with acute superficial thrombosis of right basilar vein.He tolerated extubation the next day and  respiratory status stable Dr. Leonel Ramsay felt that work up pointed to fentanyl overdose and no further work up indicated.  Therapy evaluations completed yesterday revealing balance and cognitive deficits. CIR recommended due to functional deficits.Pt is to be admitted to CIR on 04/09/2019.   Glasgow Coma Scale Score: 14  Past Medical History      Past Medical History:  Diagnosis Date   Depression    Well treated without meds currently   Elevated LFTs 08/20/2013   Hyperlipidemia 08/20/2013    Family History  family history is not on file. He was adopted.  Prior Rehab/Hospitalizations:  Has the patient had prior rehab or hospitalizations prior to admission? No  Has the patient had major surgery during 100 days prior to admission? No  Current Medications   Current Facility-Administered Medications:    0.9 %  sodium chloride infusion, , Intravenous, PRN, Candee Furbish, MD, Stopped at 04/07/19 1009   albuterol (PROVENTIL) (2.5 MG/3ML) 0.083% nebulizer solution 2.5 mg, 2.5 mg, Nebulization, Q6H PRN, Candee Furbish, MD, 2.5 mg at 04/04/19 0354   amLODipine (NORVASC) tablet 2.5 mg, 2.5 mg, Oral, Daily, Regalado, Belkys A, MD, 2.5 mg at 04/09/19 1000   aspirin EC tablet 81 mg, 81 mg, Oral, Daily, Regalado, Belkys A, MD, 81 mg at 04/09/19 1001   bisacodyl (DULCOLAX) suppository 10 mg, 10 mg, Rectal, Daily PRN, Jennelle Human B, NP   Chlorhexidine Gluconate Cloth 2 % PADS 6 each, 6 each, Topical, Q0600, Milagros Loll, MD, 6 each at 04/09/19 0700   docusate (COLACE) 50 MG/5ML liquid 100 mg, 100 mg, Per Tube, BID PRN, Jennelle Human B, NP   heparin injection 5,000 Units, 5,000 Units, Subcutaneous, Q8H, Greta Doom, MD, 5,000 Units at 04/09/19 0700   ibuprofen (ADVIL) tablet 600 mg, 600 mg, Oral, Once, [place] Eric J, DO   ondansetron [facility]) injection 4 mg, 4 mg, Intravenous, Q6H PRN, Candee Furbish, MD, 4 mg at 04/05/19 1509   pantoprazole  (PROTONIX) EC tablet 40 mg, 40  mg, Oral, Daily, Eulogio Bear U, DO, 40 mg at 04/09/19 1001   phenol (CHLORASEPTIC) mouth spray 1 spray, 1 spray, Mouth/Throat, PRN, Vann, Jessica U, DO, 1 spray at 04/08/19 1045   potassium chloride SA (KLOR-CON) CR tablet 40 mEq, 40 mEq, Oral, Q3H, [place] Donnamarie Poag, DO, 40 mEq at 04/09/19 1001   sodium chloride flush (NS) 0.9 % injection 10-40 mL, 10-40 mL, Intracatheter, Q12H, Swayze, Ava, DO, 10 mL at 04/07/19 0951   sodium chloride flush (NS) 0.9 % injection 10-40 mL, 10-40 mL, Intracatheter, PRN, Swayze, Ava, DO  Patients Current Diet:     Diet Order                  Diet - low sodium heart healthy         Diet regular Room service appropriate? Yes; Fluid consistency: Thin  Diet effective now               Precautions / Restrictions Precautions Precautions: Fall Restrictions Weight Bearing Restrictions: No   Has the patient had 2 or more falls or a fall with injury in the past year?No  Prior Activity Level Community (5-7x/wk): very active PTA, Independent, full time medical device salesman  Prior Functional Level Prior Function Level of Independence: Independent Comments: works in [place]: Did the patient need help bathing, dressing, using the toilet or eating?  Independent  Indoor Mobility: Did the patient need assistance with walking from room to room (with or without device)? Independent  Stairs: Did the patient need assistance with internal or external stairs (with or without device)? Independent  Functional Cognition: Did the patient need help planning regular tasks such as shopping or remembering to take medications? Independent  Home Assistive Devices / Equipment Home Equipment: None  Prior Device Use: Indicate devices/aids used by the patient prior to current illness, exacerbation or injury? None of the above  Current Functional Level Cognition  Overall Cognitive Status:  Impaired/Different from baseline Current Attention Level: Sustained Orientation Level: Oriented X4 Following Commands: Follows one step commands consistently, Follows one step commands with increased time, Follows multi-step commands inconsistently Safety/Judgement: Decreased awareness of safety, Decreased awareness of deficits General Comments: pt pleasant and cooperative, repetition of history of [place] and [place]ac[place] Pontes family during session; fair immediate recall; minimal rpoblem solving without cues (pt continued to kick IV pole wheels throught amb with no alterations in gait)    Extremity Assessment (includes Sensation/Coordination)  Upper Extremity Assessment: Defer to OT evaluation RUE Deficits / Details: edema, limited functional use due to disomfort with movement  RUE Coordination: decreased fine motor, decreased gross motor LUE Deficits / Details: appears WFL   Lower Extremity Assessment: Generalized weakness    ADLs  Overall ADL's : Needs assistance/impaired Grooming: Minimal assistance, Standing Upper Body Bathing: Minimal assistance, Sitting Lower Body Bathing: Minimal assistance, Sit to/from stand Upper Body Dressing : Sitting, Minimal assistance Lower Body Dressing: Minimal assistance, Sit to/from stand Toilet Transfer: Minimal assistance, Ambulation Toilet Transfer Details (indicate cue type and reason): simulated in room Functional mobility during ADLs: Minimal assistance, Cueing for safety General ADL Comments: pt limited by impaired balance, decreased activity tolerance, generalized weakness and impaired cognition     Mobility  Overal bed mobility: Modified Independent Bed Mobility: Rolling Rolling: Modified independent (Device/Increase time) Supine to sit: Modified independent (Device/Increase time) General bed mobility comments: pt long-sitting in bed upon PT arrival    Transfers  Overall transfer level: Needs assistance Equipment  used:  None Transfers: Sit to/from Stand Sit to Stand: [employer] transfer comment: Pt able to stand without use of arms, wobbly but no LOB immediately upon standing    Ambulation / Gait / Stairs / Wheelchair Mobility  Ambulation/Gait Ambulation/Gait assistance: Counsellor (Feet): 250 Feet Assistive device: IV Pole Gait Pattern/deviations: Step-through pattern, Decreased stride length, Wide base of support General Gait Details: Pt reorts improved comfort with use of IV pole for stability, still amb with increased lat movement and wide BOS Gait velocity: 0.57 m/s Gait velocity interpretation: 1.31 - 2.62 ft/sec, indicative of limited community ambulator    Posture / Balance Dynamic Sitting Balance Sitting balance - Comments: able to don socks with min guard  Balance Overall balance assessment: Needs assistance Sitting-balance support: No upper extremity supported, Feet supported Sitting balance-Leahy Scale: Good Sitting balance - Comments: able to don socks with min guard  Standing balance support: During functional activity, Single extremity supported Standing balance-Leahy Scale: Poor Standing balance comment: UE support or min assist for static standing    Special needs/care consideration BiPAP/CPAP: no CPM: no Continuous Drip IV: no Dialysis: no        Days: no Life Vest: no Oxygen: no Special Bed: no Trach Size: no Wound Vac (area): no      Location: no Skin: abrasion to medial upper face, blister to right upper arm, cellulities to right, upper, mid arm, ecchymosis to left anterior arm                          Bowel mgmt:last BM 04/08/2019, continent Bladder mgmt: continent  Diabetic mgmt: no Behavioral consideration : memory issues Chemo/radiation : no     Previous Environmental health practitioner (from acute therapy documentation) Living Arrangements: Spouse/significant other, Children Available Help at Discharge: Family Type of Home: House Home Layout: Two  level, Able to live on main level with bedroom/bathroom Home Access: Stairs to enter Entrance Stairs-Rails: Right Entrance Stairs-Number of Steps: 2 Bathroom Shower/Tub: Walk-in shower  Discharge Living Setting Plans for Discharge Living Setting: Patient's home, Lives with (comment)(wife and 2 young kids (106 and 44 yo)) Type of Home at Discharge: House Discharge Home Layout: Two level, Able to live on main level with bedroom/bathroom Alternate Level Stairs-Rails: None(NA) Alternate Level Stairs-Number of Steps: NA Discharge Home Access: Stairs to enter Entrance Stairs-Rails: Can reach both Entrance Stairs-Number of Steps: 4 Discharge Bathroom Shower/Tub: Walk-in shower Discharge Bathroom Toilet: Standard Discharge Bathroom Accessibility: Yes How Accessible: Accessible via walker Does the patient have any problems obtaining your medications?: No  Social/Family/Support Systems Patient Roles: Spouse, Parent Contact Information: wife: [phone] Anticipated Caregiver: wife (danielle) Anticipated Caregiver's Contact Information: see above Ability/Limitations of Caregiver: supervision Caregiver Availability: 24/7 Discharge Plan Discussed with Primary Caregiver: Yes(pt and wife) Is Caregiver In Agreement with Plan?: Yes Does Caregiver/Family have Issues with Lodging/Transportation while Pt is in Rehab?: No   Goals/Additional Needs Patient/Family Goal for Rehab: PT/OT: Mod I; SLP: Mod I/Supervision  Expected length of stay: 5-8 days Cultural Considerations: NA Dietary Needs: regular diet, thin liquids Equipment Needs: TBD Pt/Family Agrees to Admission and willing to participate: Yes Program Orientation Provided & Reviewed with Pt/Caregiver Including Roles  & Responsibilities: Yes(pt and his wife)  Barriers to Discharge: Home environment Child psychotherapist, Insurance for SNF coverage  Barriers to Discharge Comments: steps to enter   Decrease burden of Care through IP rehab  admission: NA   Possible need for SNF placement upon discharge:Not anticipated.  Pt has good social support from wife at DC. Pt very motivated to return home to family quickly. Anticipate pt can return to Mod I/Supervision level quickly.    Patient Condition: This patient's medical and functional status has changed since the consult dated: 04/07/2019 in which the Rehabilitation Physician determined and documented that the patient's condition is appropriate for intensive rehabilitative care in an inpatient rehabilitation facility. See "History of Present Illness" (above) for medical update. Functional changes are: improvement in transfer ability from Min A to Min G and improvement in ambulation from Min A +2 to Min G 250 feet. Patient's medical and functional status update has been discussed with the Rehabilitation physician and patient remains appropriate for inpatient rehabilitation. Will admit to inpatient rehab today.  Preadmission Screen Completed By:  Raechel Ache, OT, 04/09/2019 11:03 AM ______________________________________________________________________   Discussed status with Dr. Ranell Patrick on 04/09/2019 at 11:03AM and received approval for admission today.  Admission Coordinator:  Raechel Ache, time 11:03AM/Date 04/09/2019         Revision History Date/Time User Provider Type Action  04/09/2019 11:35 AM Raulkar, Clide Deutscher, MD Physician Sign  04/09/2019 11:12 AM Raechel Ache, OT Rehab Admission Coordinator Share  View Details Report

## 2019-04-09 NOTE — Progress Notes (Signed)
Occupational Therapy Treatment Patient Details Name: Brandon Thomas MRN: [id number] DOB: 1975/08/18 Today's Date: 04/09/2019    History of present illness Pt is a 43 y/o male with hx of polysubstance abuse found unresponsive at home by wife, LSW 8 hrs earlier with suspected drug overdose. Developed possible seizure like activity with EMS. Found with suspected aspiration, AKI, rhabdo, hyperkalemia, transaminitis, and hypotension. 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 Intubated 11/25- 11/27. R UE superficial vein thrombosis found.   OT comments   Pt making steady progress towards OT goals this session. Pt requesting to shower upon OT arrival. Pt complete full shower sitting on 3n1. Pt completes UB/LB bathing with supervision- min guard. Pt required MAX cues to remain seated in shower for safety. Pt observed to have dropped soap in shower with pt continuing to wash UB, cues provided to orient pt to loss of soap. Pt appropriately requested more soap once oriented to mistake. Pt with continued STM deficits unable to recall date that therapist stated to pt at start of session. Pt asking several times throughout shower if he could put on scrubs even though wife had stated at start of session that she had forgotten his clothes. Pt likely to DC to CIR this afternoon. Will continue to follow acutely per POC.    Follow Up Recommendations  CIR;Supervision/Assistance - 24 hour    Equipment Recommendations  Other (comment)(tbd)    Recommendations for Other Services      Precautions / Restrictions Precautions Precautions: Fall Restrictions Weight Bearing Restrictions: No       Mobility Bed Mobility Overal bed mobility: Modified Independent             General bed mobility comments: no assist needed  Transfers Overall transfer level: Needs assistance Equipment used: None   Sit to Stand: Min guard         General transfer comment: min  guard for safety, stood impulsivley from EOB    Balance Overall balance assessment: Needs assistance Sitting-balance support: No upper extremity supported;Feet supported Sitting balance-Leahy Scale: Good Sitting balance - Comments: able to don socks with min guard    Standing balance support: During functional activity;Single extremity supported Standing balance-Leahy Scale: Poor Standing balance comment: UE support or min assist for static standing                           ADL either performed or assessed with clinical judgement   ADL Overall ADL's : Needs assistance/impaired     Grooming: Oral care;Standing;Supervision/safety   Upper Body Bathing: Supervision/ safety;Set up;Sitting   Lower Body Bathing: Min guard;Sit to/from stand;Cueing for safety Lower Body Bathing Details (indicate cue type and reason): close minguard provided as pt impulsive and not following commands Upper Body Dressing : Sitting;Minimal assistance Upper Body Dressing Details (indicate cue type and reason): hospital gown Lower Body Dressing: Supervision/safety;Set up;Sitting/lateral leans Lower Body Dressing Details (indicate cue type and reason): don socks EOB Toilet Transfer: Min guard;Ambulation Toilet Transfer Details (indicate cue type and reason): simualted in room; close minguard for safety     Tub/ Shower Transfer: Walk-in shower;Cueing for safety;Ambulation;Min guard Tub/Shower Transfer Details (indicate cue type and reason): close min guard for shower tranfser to 3n1 in walkin shower Functional mobility during ADLs: Min guard General ADL Comments: pt completed full shower this session. MIN guard for safety at pt impulsive and standing multiple times during shower despite MAX verbal cues to stay  seated. Pt requires cues for sequencing as pt noted to drop soap but continued to keep washing w/o soap.     Vision   Vision Assessment?: No apparent visual deficits   Perception      Praxis      Cognition Arousal/Alertness: Awake/alert Behavior During Therapy: WFL for tasks assessed/performed;Impulsive Overall Cognitive Status: Impaired/Different from baseline Area of Impairment: Orientation;Attention;Memory;Following commands;Safety/judgement;Awareness                 Orientation Level: Disoriented to;Time Current Attention Level: Sustained Memory: Decreased recall of precautions;Decreased short-term memory Following Commands: Follows one step commands with increased time;Follows one step commands inconsistently Safety/Judgement: Decreased awareness of safety;Decreased awareness of deficits Awareness: Intellectual   General Comments: pt plesant and appreciative of shower. pt unable to state date or month, able to recall year.Pt required cues for safety as with poor safety awareness trying to stand in shower despite MAX cues to stay seated. Asked therapist twice if he had scrubs to wear even though wife had previously stated that she had forgotten his clothes.pt with fair ability to problem solve as noted by pt ability to effectively problem solve how to apply deodorant through hospital gown.         Exercises     Shoulder Instructions       General Comments LUE noted to be swollen; education provided on elevation to assist with edema management; RN aware    Pertinent Vitals/ Pain       Pain Assessment: Faces Faces Pain Scale: Hurts a little bit Pain Location: L arm Pain Descriptors / Indicators: Heaviness Pain Intervention(s): Monitored during session;Repositioned;Other (comment)(encouraged elevation to manage edema)  Home Living                                          Prior Functioning/Environment              Frequency  Min 2X/week        Progress Toward Goals  OT Goals(current goals can now be found in the care plan section)  Progress towards OT goals: Progressing toward goals  Acute Rehab OT Goals Patient  Stated Goal: to return home OT Goal Formulation: With patient Time For Goal Achievement: 04/20/19 Potential to Achieve Goals: Good  Plan Discharge plan remains appropriate    Co-evaluation                 AM-PAC OT "6 Clicks" Daily Activity     Outcome Measure   Help from another person eating meals?: A Little Help from another person taking care of personal grooming?: A Little Help from another person toileting, which includes using toliet, bedpan, or urinal?: A Little Help from another person bathing (including washing, rinsing, drying)?: A Little Help from another person to put on and taking off regular upper body clothing?: A Little Help from another person to put on and taking off regular lower body clothing?: A Little 6 Click Score: 18    End of Session Equipment Utilized During Treatment: Other (comment)(3n1)  OT Visit Diagnosis: Other abnormalities of gait and mobility (R26.89);Muscle weakness (generalized) (M62.81);Other symptoms and signs involving cognitive function   Activity Tolerance Patient tolerated treatment well   Patient Left in bed;with call bell/phone within reach;with family/visitor present   Nurse Communication Mobility status        Time: 6808-8110 OT Time Calculation (min): 31 min  Charges: OT General Charges $OT Visit: 1 Visit OT Treatments $Self Care/Home Management : 23-37 mins  Lanier Clam., COTA/L Acute Rehabilitation Services [phone] [place] 04/09/2019, 12:47 PM

## 2019-04-09 NOTE — Progress Notes (Signed)
Physical Therapy Treatment Patient Details Name: Brandon Thomas MRN: [id number] DOB: 1976-04-11 Today's Date: 04/09/2019    History of Present Illness Pt is a 43 y/o male with hx of polysubstance abuse found unresponsive at home by wife, LSW 8 hrs earlier with suspected drug overdose. Developed possible seizure like activity with EMS. Found with suspected aspiration, AKI, rhabdo, hyperkalemia, transaminitis, and hypotension. 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 Intubated 11/25- 11/27. R UE superficial vein thrombosis found.    PT Comments    Pt OOB in chair with wife present upon arrival of PT. Pt was able to demo continued improvements in balance and mobility, but continues to have limitations in STM that impact his safety and function. The pt was able to demo good safety and balance with vertical/lateral head turns, turning, and dual-task activities while walking, but slows gait speed and increases lateral movement. The pt will continue to benefit from skilled therapy to address memory and cog deficits as they relate to functional mobility and safety.     Follow Up Recommendations  CIR     Equipment Recommendations  None recommended by PT    Recommendations for Other Services       Precautions / Restrictions Precautions Precautions: Fall Precaution Comments: limited STM Restrictions Weight Bearing Restrictions: No    Mobility  Bed Mobility Overal bed mobility: Modified Independent Bed Mobility: Rolling Rolling: Modified independent (Device/Increase time)   Supine to sit: Modified independent (Device/Increase time)     General bed mobility comments: OOB  on couch upon PT arrival  Transfers Overall transfer level: Needs assistance Equipment used: None Transfers: Sit to/from Stand Sit to Stand: Supervision         General transfer comment: supervision for safety  Ambulation/Gait Ambulation/Gait assistance: Min  guard Gait Distance (Feet): 500 Feet Assistive device: None Gait Pattern/deviations: Step-through pattern;Decreased stride length;Wide base of support Gait velocity: 1.0 m/s Gait velocity interpretation: >2.62 ft/sec, indicative of community ambulatory General Gait Details: improved gait pattern without device, slowed gait speed with mental tasks (counting backwards from 86 by 2, saying alphabet while skipping every other letter)   Stairs             Wheelchair Mobility    Modified Rankin (Stroke Patients Only)       Balance Overall balance assessment: Needs assistance Sitting-balance support: No upper extremity supported;Feet supported Sitting balance-Leahy Scale: Normal Sitting balance - Comments: able to don socks with min guard    Standing balance support: No upper extremity supported;During functional activity Standing balance-Leahy Scale: Fair Standing balance comment: UE support or min assist for static standing     Tandem Stance - Right Leg: 25(R leg lead) Tandem Stance - Left Leg: 30(L leg lead) Rhomberg - Eyes Opened: 30 Rhomberg - Eyes Closed: 30(increased sway) High level balance activites: Turns;Sudden stops;Head turns High Level Balance Comments: pt with increased lateral movements and slowed gait speed, no LOB            Cognition Arousal/Alertness: Awake/alert Behavior During Therapy: WFL for tasks assessed/performed;Impulsive Overall Cognitive Status: Impaired/Different from baseline Area of Impairment: Orientation;Attention;Memory;Following commands;Safety/judgement;Awareness                 Orientation Level: Disoriented to;Time Current Attention Level: Sustained Memory: Decreased recall of precautions;Decreased short-term memory Following Commands: Follows one step commands with increased time;Follows one step commands inconsistently Safety/Judgement: Decreased awareness of deficits Awareness: Intellectual Problem Solving: Slow  processing;Decreased initiation;Difficulty sequencing;Requires verbal cues;Requires tactile cues  General Comments: Pt with growing awareness of STM deficits, reports he is just realizing how much he is forgetting. The pt was asked to complete multiple dual-task skills while ambulating today, was unable to recall without sig clues. Asked therapist twice if he had scrubs to wear even though wife had previously stated that she had forgotten his clothes.      Exercises      General Comments General comments (skin integrity, edema, etc.): LUE noted to be swollen; education provided on elevation to assist with edema management; RN aware      Pertinent Vitals/Pain Pain Assessment: Faces Pain Score: 4  Faces Pain Scale: Hurts a little bit Pain Location: L arm Pain Descriptors / Indicators: Heaviness;Tightness;Throbbing Pain Intervention(s): Limited activity within patient's tolerance;Monitored during session;Repositioned    Home Living                      Prior Function            PT Goals (current goals can now be found in the care plan section) Acute Rehab PT Goals Patient Stated Goal: to return home PT Goal Formulation: With patient Time For Goal Achievement: 04/20/19 Potential to Achieve Goals: Good Progress towards PT goals: Progressing toward goals    Frequency    Min 3X/week      PT Plan Current plan remains appropriate    Co-evaluation              AM-PAC PT "6 Clicks" Mobility   Outcome Measure  Help needed turning from your back to your side while in a flat bed without using bedrails?: None Help needed moving from lying on your back to sitting on the side of a flat bed without using bedrails?: None Help needed moving to and from a bed to a chair (including a wheelchair)?: A Little Help needed standing up from a chair using your arms (e.g., wheelchair or bedside chair)?: A Little Help needed to walk in hospital room?: A Little Help needed climbing  3-5 steps with a railing? : A Little 6 Click Score: 20    End of Session Equipment Utilized During Treatment: Gait belt Activity Tolerance: Patient tolerated treatment well Patient left: with call bell/phone within reach;in chair Nurse Communication: Mobility status PT Visit Diagnosis: Unsteadiness on feet (R26.81);Other abnormalities of gait and mobility (R26.89);Muscle weakness (generalized) (M62.81)     Time: 0960-4540 PT Time Calculation (min) (ACUTE ONLY): 25 min  Charges:  $Gait Training: 23-37 mins                     Mickey Farber, PT, DPT   Acute Rehabilitation Department [phone]   Otho Bellows 04/09/2019, 2:49 PM

## 2019-04-09 NOTE — Progress Notes (Signed)
Patient arrived on unit, oriented to unit. Reviewed medicines, therapy schedule, rehab routine and plan of care. States an understanding of information reviewed. [place]

## 2019-04-09 NOTE — Progress Notes (Addendum)
Patient found sitting on bedside with  bed alarm ringing by staff nurse, stating  that "he felt he may have had a seizure in his" sleep and had  bite his tongue. She informed this Probation officer  The patient was assessed noting no acute distress, reassurance provide,some soreness to right lateral tongue area present., monitor. No active seizure

## 2019-04-09 NOTE — H&P (Signed)
Physical Medicine and Rehabilitation Admission H&P    Chief Complaint  Patient presents with   Encephalopathy due to drug overdose    HPI:  Brandon Thomas is a 43 year old male with history of depression, opioid abuse (s/p drug rehab but reported to be having behaviors consistent with relapse), hypogonadism (self treating with IM steroids)  who was admitted on 04/03/19 after found unresponsive with gurgling sound and CPR initiated. He was received narcan withou response and had decerebrate posturing with concerns of status epilepticus. Work up revealed lactic acidosis with  CK 9495, WBC- 22.5, shocked liver and was febrile with T-101.5. UCS positive for benzo's. He was intuabated for airway protection ands started on IVF for rhabdomyolysis, antibiotics for sepsis felt to be due to aspiration PNA and/or RUE cellulitis. Neurology consulted for input and EEG done revealing severe diffuse encephalopathy.  MRI brain done revealing restricted diffusion throughout the hippocampi likely due to acute injury from toxic exposure.  BC/LP negative and Dr. Leonel Ramsay felt that work up pointed to fentanyl overdose- and has signed off.  RUE dopplers done due to ongoing edema and showed superficial right basilar vein thrombosis --low dose ASA added and local measures used. Patient continues to demonstrated STM deficits with deficits in problem solving and perserverative speech. CIR recommended due to functional deficits.    He is very concerned that his right arm is increasing in size. He feels numbness and tingling in his right hand, which is not new for him. Denies pain. He was more active this morning with PT and has not been elevating his hand. His wife says he was given warm compresses for his hand in acute care. He did have a doppled last night that showed no change in his superficial thrombophlebitis.     Review of Systems  Constitutional: Negative for chills and fever.  Eyes: Negative for blurred  vision.  Respiratory: Negative for shortness of breath.   Cardiovascular: Negative for chest pain and palpitations.  Gastrointestinal: Negative for abdominal pain, heartburn and nausea.  Skin: Negative for rash.  Neurological: Positive for sensory change (RUE). Negative for weakness.  Psychiatric/Behavioral: The patient is nervous/anxious.       Past Medical History:  Diagnosis Date   Depression    Well treated without meds currently   Elevated LFTs 08/20/2013   Hyperlipidemia 08/20/2013    Past Surgical History:  Procedure Laterality Date   WISDOM TOOTH EXTRACTION      Family History  Adopted: Yes    Social History: Married. Works as Copy rep. He  reports that he has never smoked. His smokeless tobacco use includes chew--one pack daily.  He reports current alcohol use of about 1.0 standard drinks of alcohol per week. He reports that he uses opioids--"injects whatever he can get"  .    Allergies  Allergen Reactions   Atorvastatin Other (See Comments)    RUQ pain   Crestor [Rosuvastatin] Other (See Comments)    Elevated LFTs    Medications Prior to Admission  Medication Sig Dispense Refill   [START ON 04/10/2019] amLODipine (NORVASC) 2.5 MG tablet Take 1 tablet (2.5 mg total) by mouth daily.     [START ON 04/10/2019] aspirin EC 81 MG EC tablet Take 1 tablet (81 mg total) by mouth daily.     [START ON 04/10/2019] cephALEXin (KEFLEX) 500 MG capsule Take 1 capsule (500 mg total) by mouth 2 (two) times daily for 3 days. 6 capsule 0   [START  ON 04/10/2019] pantoprazole (PROTONIX) 40 MG tablet Take 1 tablet (40 mg total) by mouth daily.      Drug Regimen Review  Drug regimen was reviewed and remains appropriate with no significant issues identified  Home: Home Living Family/patient expects to be discharged to:: Private residence Living Arrangements: Spouse/significant other, Children Available Help at Discharge: Family Type of Home: House Home  Access: Stairs to enter Technical brewer of Steps: 2 Entrance Stairs-Rails: Right Home Layout: Two level, Able to live on main level with bedroom/bathroom Bathroom Shower/Tub: Walk-in shower Home Equipment: None  Functional History: Prior Function Level of Independence: Independent Comments: works in Corporate investment [employer] Status:  Mobility: Bed Mobility General bed mobility comments: OOB  on couch upon PT arrival Transfers Overall transfer level: Needs assistance Equipment used: None Transfers: Sit to/from Stand Sit to Stand: Supervision General transfer comment: supervision for safety Ambulation/Gait Ambulation/Gait assistance: Min guard Gait Distance (Feet): 500 Feet Assistive device: None Gait Pattern/deviations: Step-through pattern, Decreased stride length, Wide base of support General Gait Details: improved gait pattern without device, slowed gait speed with mental tasks (counting backwards from 86 by 2, saying alphabet while skipping every other letter) Gait velocity: 1.0 m/s Gait velocity interpretation: >2.62 ft/sec, indicative of community ambulatory    ADL: Overall ADL's : Needs assistance/impaired Grooming: Minimal assistance, Standing Upper Body Bathing: Minimal assistance, Sitting Lower Body Bathing: Minimal assistance, Sit to/from stand Upper Body Dressing : Sitting, Minimal assistance Lower Body Dressing: Minimal assistance, Sit to/from stand Toilet Transfer: Minimal assistance, Ambulation Toilet Transfer Details (indicate cue type and reason): simulated in room Functional mobility during ADLs: Minimal assistance, Cueing for safety General ADL Comments: pt limited by impaired balance, decreased activity tolerance, generalized weakness and impaired cognition   Cognition: Cognition Overall Cognitive Status: Impaired/Different from baseline Cognition Arousal/Alertness: Awake/alert Behavior During Therapy: WFL for tasks  assessed/performed, Impulsive Overall Cognitive Status: Impaired/Different from baseline Area of Impairment: Orientation, Attention, Memory, Following commands, Safety/judgement, Awareness Orientation Level: Disoriented to, Time Current Attention Level: Sustained Memory: Decreased recall of precautions, Decreased short-term memory(pt forgets tasks completed during 5 min walk immediately following the walk) Following Commands: Follows one step commands with increased time, Follows one step commands inconsistently Safety/Judgement: Decreased awareness of deficits Awareness: Intellectual Problem Solving: Slow processing, Decreased initiation, Difficulty sequencing, Requires verbal cues, Requires tactile cues General Comments: Pt with growing awareness of STM deficits, reports he is just realizing how much he is forgetting. The pt was asked to complete multiple dual-task skills while ambulating today, was unable to recall without sig clues. Asked therapist twice if he had scrubs to wear even though wife had previously stated that she had forgotten his clothes.   There were no vitals taken for this visit.  Physical Exam  Nursing note and vitals reviewed. Constitutional: He appears well-developed and well-nourished. No distress.  HENT:  Head: Normocephalic and atraumatic.  Mouth/Throat: Oropharynx is clear and moist.  Eyes: Pupils are equal, round, and reactive to light. Conjunctivae are normal.  Respiratory: No respiratory distress.  GI: He exhibits no distension. There is no abdominal tenderness.  Musculoskeletal:     Comments: RUE--tight with 2+ edema and foam dressing on biceps.   Strength appears intact throughout with 5/5 strength on testing with the exception of his right upper extremity which has limited hand grip (3/5) and EE, WF, and EF (4/5). RUE with numbness and reporting increase in edema right hand post therapy today.  Warm to touch with + pulse.  Neurological: He is alert. AOx2.  Unable to state date.  Speech clear. Has amnesia of events leading to admission. He was able to utilize calender for date independently and answer most basic orientation questions without difficulty. Able to follow 2 step commands.   Skin: He is not diaphoretic.  Psychiatric: Depressed, tearful about the impact his illness is having on his family.     Results for orders placed or performed during the hospital encounter of 04/02/19 (from the past 48 hour(s))  Glucose, capillary     Status: None   Collection Time: 04/07/19  4:05 PM  Result Value Ref Range   Glucose-Capillary 96 70 - 99 mg/dL  Glucose, capillary     Status: Abnormal   Collection Time: 04/07/19  7:31 PM  Result Value Ref Range   Glucose-Capillary 100 (H) 70 - 99 mg/dL  Glucose, capillary     Status: None   Collection Time: 04/07/19 11:16 PM  Result Value Ref Range   Glucose-Capillary 88 70 - 99 mg/dL  Hepatic function panel     Status: Abnormal   Collection Time: 04/08/19  3:38 AM  Result Value Ref Range   Total Protein 5.7 (L) 6.5 - 8.1 g/dL   Albumin 3.1 (L) 3.5 - 5.0 g/dL   AST 135 (H) 15 - 41 U/L   ALT 207 (H) 0 - 44 U/L   Alkaline Phosphatase 47 38 - 126 U/L   Total Bilirubin 0.8 0.3 - 1.2 mg/dL   Bilirubin, Direct 0.2 0.0 - 0.2 mg/dL   Indirect Bilirubin 0.6 0.3 - 0.9 mg/dL    Comment: Performed at [facility], [place] [address], [place], [place] [address]  Basic metabolic panel     Status: Abnormal   Collection Time: 04/08/19  3:38 AM  Result Value Ref Range   Sodium 144 135 - 145 mmol/L   Potassium 2.9 (L) 3.5 - 5.1 mmol/L   Chloride 107 98 - 111 mmol/L   CO2 25 22 - 32 mmol/L   Glucose, Bld 92 70 - 99 mg/dL   BUN 5 (L) 6 - 20 mg/dL   Creatinine, Ser 1.05 0.61 - 1.24 mg/dL   Calcium 8.7 (L) 8.9 - 10.3 mg/dL   GFR calc non Af Amer >60 >60 mL/min   GFR calc Af Amer >60 >60 mL/min   Anion gap 12 5 - 15    Comment: Performed at [facility], [place] [address], [place] [address]  CBC      Status: None   Collection Time: 04/08/19  3:38 AM  Result Value Ref Range   WBC 9.4 4.0 - 10.5 K/uL   RBC 4.57 4.22 - 5.81 MIL/uL   Hemoglobin 13.2 13.0 - 17.0 g/dL   HCT 41.1 39.0 - 52.0 %   MCV 89.9 80.0 - 100.0 fL   MCH 28.9 26.0 - 34.0 pg   MCHC 32.1 30.0 - 36.0 g/dL   RDW 13.6 11.5 - 15.5 %   Platelets 329 150 - 400 K/uL   nRBC 0.0 0.0 - 0.2 %    Comment: Performed at [facility], [place] [address] [place], [place] [address]  Glucose, capillary     Status: None   Collection Time: 04/08/19  4:25 AM  Result Value Ref Range   Glucose-Capillary 87 70 - 99 mg/dL  Glucose, capillary     Status: Abnormal   Collection Time: 04/08/19  7:27 AM  Result Value Ref Range   Glucose-Capillary 106 (H)  70 - 99 mg/dL  Glucose, capillary     Status: Abnormal   Collection Time: 04/08/19 11:39 AM  Result Value Ref Range   Glucose-Capillary 104 (H) 70 - 99 mg/dL  Glucose, capillary     Status: None   Collection Time: 04/08/19  4:07 PM  Result Value Ref Range   Glucose-Capillary 96 70 - 99 mg/dL  Glucose, capillary     Status: None   Collection Time: 04/08/19  8:35 PM  Result Value Ref Range   Glucose-Capillary 97 70 - 99 mg/dL  Glucose, capillary     Status: None   Collection Time: 04/08/19 11:45 PM  Result Value Ref Range   Glucose-Capillary 94 70 - 99 mg/dL  Glucose, capillary     Status: Abnormal   Collection Time: 04/09/19  3:55 AM  Result Value Ref Range   Glucose-Capillary 109 (H) 70 - 99 mg/dL  Hepatic function panel     Status: Abnormal   Collection Time: 04/09/19  4:08 AM  Result Value Ref Range   Total Protein 5.5 (L) 6.5 - 8.1 g/dL   Albumin 3.0 (L) 3.5 - 5.0 g/dL   AST 102 (H) 15 - 41 U/L   ALT 200 (H) 0 - 44 U/L   Alkaline Phosphatase 51 38 - 126 U/L   Total Bilirubin 0.3 0.3 - 1.2 mg/dL   Bilirubin, Direct <0.1 0.0 - 0.2 mg/dL   Indirect Bilirubin NOT CALCULATED 0.3 - 0.9 mg/dL    Comment: Performed at [facility], [place] [address] [place], [place]  [address]  Basic metabolic panel     Status: Abnormal   Collection Time: 04/09/19  4:08 AM  Result Value Ref Range   Sodium 143 135 - 145 mmol/L   Potassium 3.0 (L) 3.5 - 5.1 mmol/L   Chloride 105 98 - 111 mmol/L   CO2 26 22 - 32 mmol/L   Glucose, Bld 113 (H) 70 - 99 mg/dL   BUN 9 6 - 20 mg/dL   Creatinine, Ser 1.16 0.61 - 1.24 mg/dL   Calcium 8.5 (L) 8.9 - 10.3 mg/dL   GFR calc non Af Amer >60 >60 mL/min   GFR calc Af Amer >60 >60 mL/min   Anion gap 12 5 - 15    Comment: Performed at [facility] [address], [place] [address]  CBC     Status: None   Collection Time: 04/09/19  4:08 AM  Result Value Ref Range   WBC 9.0 4.0 - 10.5 K/uL   RBC 4.67 4.22 - 5.81 MIL/uL   Hemoglobin 13.4 13.0 - 17.0 g/dL   HCT 42.4 39.0 - 52.0 %   MCV 90.8 80.0 - 100.0 fL   MCH 28.7 26.0 - 34.0 pg   MCHC 31.6 30.0 - 36.0 g/dL   RDW 14.0 11.5 - 15.5 %   Platelets 323 150 - 400 K/uL   nRBC 0.0 0.0 - 0.2 %    Comment: Performed at [facility], [place] [address], [place], [place] [address]  Glucose, capillary     Status: None   Collection Time: 04/09/19  7:33 AM  Result Value Ref Range   Glucose-Capillary 98 70 - 99 mg/dL  Glucose, capillary     Status: None   Collection Time: 04/09/19 11:35 AM  Result Value Ref Range   Glucose-Capillary 99 70 - 99 mg/dL  Urinalysis, Routine w reflex microscopic     Status: Abnormal   Collection Time: 04/09/19  1:10 PM  Result Value Ref Range   Color, Urine YELLOW YELLOW   APPearance CLEAR CLEAR   Specific Gravity, Urine 1.011 1.005 - 1.030   pH 7.0 5.0 - 8.0   Glucose, UA NEGATIVE NEGATIVE mg/dL   Hgb urine dipstick NEGATIVE NEGATIVE   Bilirubin Urine NEGATIVE NEGATIVE   Ketones, ur NEGATIVE NEGATIVE mg/dL   Protein, ur NEGATIVE NEGATIVE mg/dL   Nitrite NEGATIVE NEGATIVE   Leukocytes,Ua TRACE (A) NEGATIVE   RBC / HPF 0-5 0 - 5 RBC/hpf   WBC, UA 11-20 0 - 5 WBC/hpf   Bacteria, UA NONE SEEN NONE SEEN    Comment: Performed at [facility] [address], [place], [place] [address]   Vas [place] Upper Extremity Venous Duplex  Result Date: 04/08/2019 UPPER VENOUS STUDY  Indications: Swelling Limitations: Body habitus. Comparison Study: 04/06/19 Performing Technologist: Adwolf, RVT  Examination Guidelines: A complete evaluation includes B-mode imaging, spectral Doppler, color Doppler, and power Doppler as needed of all accessible portions of each vessel. Bilateral testing is considered an integral part of a complete examination. Limited examinations for reoccurring indications may be performed as noted.  Right Findings: +----------+------------+---------+-----------+--------------+----------------+  RIGHT      Compressible Phasicity Spontaneous   Properties       Summary       +----------+------------+---------+-----------+--------------+----------------+  IJV            Full        Yes        Yes                                      +----------+------------+---------+-----------+--------------+----------------+  Subclavian     Full        Yes        Yes                                      +----------+------------+---------+-----------+--------------+----------------+  Axillary       Full        Yes        Yes                                      +----------+------------+---------+-----------+--------------+----------------+  Brachial       Full        Yes        Yes                                      +----------+------------+---------+-----------+--------------+----------------+  Radial         Full                                                            +----------+------------+---------+-----------+--------------+----------------+  Ulnar          Full                                                            +----------+------------+---------+-----------+--------------+----------------+  Cephalic       Full                                                             +----------+------------+---------+-----------+--------------+----------------+  Basilic      Partial                              softly           Age                                                          echogenic     Indeterminate    +----------+------------+---------+-----------+--------------+----------------+  Left Findings: +----------+------------+---------+-----------+----------+----------+  LEFT       Compressible Phasicity Spontaneous Properties  Summary    +----------+------------+---------+-----------+----------+----------+  Subclavian     Full        Yes        Yes                PICC noted  +----------+------------+---------+-----------+----------+----------+  Summary:  Right: No evidence of deep vein thrombosis in the upper extremity. Findings consistent with age indeterminate superficial vein thrombosis involving the right basilic vein. SVT appears unchanged from prior study, 04/06/19.  Left: No evidence of thrombosis in the subclavian.  *See table(s) above for measurements and observations.  Diagnosing physician: Deitra Mayo MD Electronically signed by Deitra Mayo MD on 04/08/2019 at 5:42:00 PM.    Final     Medical Problem List and Plan: 1.  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   -patient may shower  -ELOS/Goals: 5 days, modI in PT, OT, SLP 2.  Antithrombotics: -DVT/anticoagulation:  Pharmaceutical: Heparin  -antiplatelet therapy: low dose ASA 3. Pain Management: tylenol prn 4. Mood: LCSW to follow for evaluation and support.   -antipsychotic agents: N/A  -Tearful at times for being a burden to his family.  5. Neuropsych: This patient is not fully capable of making decisions on his own behalf.  -Would benefit from neuropsych evaluation.  6. Skin/Wound Care: Monitor RUE daily for resolution of erythema 7. Fluids/Electrolytes/Nutrition: Monitor I/O--  8. MSSA PNA/Cellulitis RUE: treated with Vancomycin/rocephin x 7  days--transitioned to Cefazolin 11/30-->transition to Keflex for three additional days starting tomorrow.  9. HTN: Monitor BP tid--continue low dose amlodipine 10. Persistent hypokalemia: Being supplemented intermittently add supplement x 2 days. Check magnesium levels in am.  11. Shocked liver: LFTs slowly resolving--recheck in am. Recheck ammonia level in am. 12. Rhabdomyolysis: CK 9,495-->27,887-->4,316.  Continue to encourage fluid intake and monitor renal status with serial checks. 13. Edema RUE: will check follow up dopplers due to increase in edema.   -Educated regarding elevation with pillows, retrograde massage, ice, and compression with ACE bandages to reduce edema  Reesa Chew, PA  I have personally performed a face to face diagnostic evaluation, including, but not limited to relevant history and physical exam findings, of this patient and developed relevant assessment and plan.  Additionally, I have reviewed and concur with the physician assistant's documentation above.  The patient's status has not  changed. The original post admission physician evaluation remains appropriate, and any changes from the pre-admission screening or documentation from the acute chart are noted above.   Leeroy Cha, MD

## 2019-04-10 ENCOUNTER — Inpatient Hospital Stay (HOSPITAL_COMMUNITY): Payer: BLUE CROSS/BLUE SHIELD | Admitting: Speech Pathology

## 2019-04-10 ENCOUNTER — Inpatient Hospital Stay (HOSPITAL_COMMUNITY): Payer: BLUE CROSS/BLUE SHIELD

## 2019-04-10 ENCOUNTER — Inpatient Hospital Stay (HOSPITAL_COMMUNITY): Payer: BLUE CROSS/BLUE SHIELD | Admitting: Physical Therapy

## 2019-04-10 DIAGNOSIS — T796XXS Traumatic ischemia of muscle, sequela: Secondary | ICD-10-CM

## 2019-04-10 DIAGNOSIS — I10 Essential (primary) hypertension: Secondary | ICD-10-CM

## 2019-04-10 DIAGNOSIS — L03113 Cellulitis of right upper limb: Secondary | ICD-10-CM

## 2019-04-10 LAB — CULTURE, BLOOD (ROUTINE X 2)
Culture: NO GROWTH
Culture: NO GROWTH
Special Requests: ADEQUATE
Special Requests: ADEQUATE

## 2019-04-10 LAB — CBC WITH DIFFERENTIAL/PLATELET
Abs Immature Granulocytes: 0.14 10*3/uL — ABNORMAL HIGH (ref 0.00–0.07)
Basophils Absolute: 0.1 10*3/uL (ref 0.0–0.1)
Basophils Relative: 1 %
Eosinophils Absolute: 0.2 10*3/uL (ref 0.0–0.5)
Eosinophils Relative: 3 %
HCT: 45.6 % (ref 39.0–52.0)
Hemoglobin: 14.6 g/dL (ref 13.0–17.0)
Immature Granulocytes: 2 %
Lymphocytes Relative: 23 %
Lymphs Abs: 2 10*3/uL (ref 0.7–4.0)
MCH: 28.7 pg (ref 26.0–34.0)
MCHC: 32 g/dL (ref 30.0–36.0)
MCV: 89.6 fL (ref 80.0–100.0)
Monocytes Absolute: 0.7 10*3/uL (ref 0.1–1.0)
Monocytes Relative: 9 %
Neutro Abs: 5.3 10*3/uL (ref 1.7–7.7)
Neutrophils Relative %: 62 %
Platelets: 351 10*3/uL (ref 150–400)
RBC: 5.09 MIL/uL (ref 4.22–5.81)
RDW: 14 % (ref 11.5–15.5)
WBC: 8.4 10*3/uL (ref 4.0–10.5)
nRBC: 0 % (ref 0.0–0.2)

## 2019-04-10 LAB — COMPREHENSIVE METABOLIC PANEL
ALT: 165 U/L — ABNORMAL HIGH (ref 0–44)
AST: 62 U/L — ABNORMAL HIGH (ref 15–41)
Albumin: 3.3 g/dL — ABNORMAL LOW (ref 3.5–5.0)
Alkaline Phosphatase: 56 U/L (ref 38–126)
Anion gap: 11 (ref 5–15)
BUN: 12 mg/dL (ref 6–20)
CO2: 26 mmol/L (ref 22–32)
Calcium: 8.9 mg/dL (ref 8.9–10.3)
Chloride: 105 mmol/L (ref 98–111)
Creatinine, Ser: 1.08 mg/dL (ref 0.61–1.24)
GFR calc Af Amer: >60 mL/min (ref >60)
GFR calc non Af Amer: >60 mL/min (ref >60)
Glucose, Bld: 93 mg/dL (ref 70–99)
Potassium: 3.4 mmol/L — ABNORMAL LOW (ref 3.5–5.1)
Sodium: 142 mmol/L (ref 135–145)
Total Bilirubin: 0.6 mg/dL (ref 0.3–1.2)
Total Protein: 6 g/dL — ABNORMAL LOW (ref 6.5–8.1)

## 2019-04-10 LAB — MAGNESIUM: Magnesium: 2.1 mg/dL (ref 1.7–2.4)

## 2019-04-10 MED ORDER — POTASSIUM CHLORIDE CRYS ER 20 MEQ PO TBCR
20.0000 meq | EXTENDED_RELEASE_TABLET | Freq: Every day | ORAL | Status: DC
  Administered 2019-04-11 – 2019-04-14 (×4): 20 meq via ORAL
  Filled 2019-04-10 (×4): qty 1

## 2019-04-10 NOTE — Progress Notes (Signed)
[place] PHYSICAL MEDICINE & REHABILITATION PROGRESS NOTE   Subjective/Complaints:    Objective:   Vas [place] Upper Extremity Venous Duplex  Result Date: 04/08/2019 UPPER VENOUS STUDY  Indications: Swelling Limitations: Body habitus. Comparison Study: 04/06/19 Performing Technologist: [place], RVT  Examination Guidelines: A complete evaluation includes B-mode imaging, spectral Doppler, color Doppler, and power Doppler as needed of all accessible portions of each vessel. Bilateral testing is considered an integral part of a complete examination. Limited examinations for reoccurring indications may be performed as noted.  Right Findings: +----------+------------+---------+-----------+--------------+----------------+ RIGHT     CompressiblePhasicitySpontaneous  Properties      Summary      +----------+------------+---------+-----------+--------------+----------------+ IJV           Full       Yes       Yes                                   +----------+------------+---------+-----------+--------------+----------------+ Subclavian    Full       Yes       Yes                                   +----------+------------+---------+-----------+--------------+----------------+ Axillary      Full       Yes       Yes                                   +----------+------------+---------+-----------+--------------+----------------+ Brachial      Full       Yes       Yes                                   +----------+------------+---------+-----------+--------------+----------------+ Radial        Full                                                       +----------+------------+---------+-----------+--------------+----------------+ Ulnar         Full                                                       +----------+------------+---------+-----------+--------------+----------------+ Cephalic      Full                                                        +----------+------------+---------+-----------+--------------+----------------+ Basilic     Partial                           softly          Age  echogenic    Indeterminate   +----------+------------+---------+-----------+--------------+----------------+  Left Findings: +----------+------------+---------+-----------+----------+----------+ LEFT      CompressiblePhasicitySpontaneousProperties Summary   +----------+------------+---------+-----------+----------+----------+ Subclavian    Full       Yes       Yes              PICC noted +----------+------------+---------+-----------+----------+----------+  Summary:  Right: No evidence of deep vein thrombosis in the upper extremity. Findings consistent with age indeterminate superficial vein thrombosis involving the right basilic vein. SVT appears unchanged from prior study, 04/06/19.  Left: No evidence of thrombosis in the subclavian.  *See table(s) above for measurements and observations.  Diagnosing physician: Deitra Mayo MD Electronically signed by Deitra Mayo MD on 04/08/2019 at 5:42:00 PM.    Final    Recent Labs    04/09/19 0408 04/10/19 0618  WBC 9.0 8.4  HGB 13.4 14.6  HCT 42.4 45.6  PLT 323 351   Recent Labs    04/09/19 0408 04/10/19 0618  NA 143 142  K 3.0* 3.4*  CL 105 105  CO2 26 26  GLUCOSE 113* 93  BUN 9 12  CREATININE 1.16 1.08  CALCIUM 8.5* 8.9    Intake/Output Summary (Last 24 hours) at 04/10/2019 1025 Last data filed at 04/10/2019 0740 Gross per 24 hour  Intake 480 ml  Output -  Net 480 ml     Physical Exam: Vital Signs Blood pressure 125/77, pulse 77, temperature 98.2 F (36.8 C), temperature source Oral, resp. rate 20, weight 94.3 kg, SpO2 96 %. Constitutional: No distress . Vital signs reviewed. HEENT: EOMI, oral membranes moist Neck: supple Cardiovascular: RRR without murmur. No JVD    Respiratory: CTA Bilaterally  without wheezes or rales. Normal effort    GI: BS +, non-tender, non-distended  Musculoskeletal:  Comments: RUE-1 to  2+ edema and foam dressing on biceps. Neurological: He is alert.AOx2. Unable to state date. Motor 5/5 except for RUE which is 3-4/5 d/t pain/edema Normal speech, STM deficits. Forgot date a few minutes after we discussed it. Can recount biogrpahical info. Some difficulties with processing. Normal language Skin: He isnot diaphoretic. Psychiatric: a little anxious, otherwise pleasant    Assessment/Plan: 1. Functional deficits secondary to toxic/anoxic encephalopathy which require 3+ hours per day of interdisciplinary therapy in a comprehensive inpatient rehab setting.  Physiatrist is providing close team supervision and 24 hour management of active medical problems listed below.  Physiatrist and rehab team continue to assess barriers to discharge/monitor patient progress toward functional and medical goals  Care Tool:  Bathing              Bathing assist       Upper Body Dressing/Undressing Upper body dressing   What is the patient wearing?: Pull over shirt    Upper body assist Assist Level: Supervision/Verbal cueing    Lower Body Dressing/Undressing Lower body dressing      What is the patient wearing?: Pants     Lower body assist Assist for lower body dressing: Independent     Toileting Toileting    Toileting assist Assist for toileting: Contact Guard/Touching assist     Transfers Chair/bed transfer  Transfers assist     Chair/bed transfer assist level: Independent     Locomotion Ambulation   Ambulation assist      Assist level: Contact Guard/Touching assist Assistive device: No Device     Walk 10 feet activity   Assist     Assist level: Contact Guard/Touching assist(just wanted to walk to tire  himself down for bed)     Walk 50 feet activity   Assist    Assist level: Contact Guard/Touching assist       Walk 150 feet activity   Assist           Walk 10 feet on uneven surface  activity   Assist           Wheelchair     Assist               Wheelchair 50 feet with 2 turns activity    Assist            Wheelchair 150 feet activity     Assist          Blood pressure 125/77, pulse 77, temperature 98.2 F (36.8 C), temperature source Oral, resp. rate 20, weight 94.3 kg, SpO2 96 %.  Medical Problem List and Plan: 1.  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              -patient may shower             -ELOS/Goals: 5 days, modI in PT, OT, SLP  -Patient is beginning CIR therapies today including PT and OT  2.  Antithrombotics: -DVT/anticoagulation:  Pharmaceutical: Heparin, dopplers pending             -antiplatelet therapy: low dose ASA 3. Pain Management: tylenol prn 4. Mood: LCSW to follow for evaluation and support.              -antipsychotic agents: N/A             -team to provide ego support.  5. Neuropsych: This patient is not fully capable of making decisions on his own behalf.             -Would benefit from neuropsych evaluation.  6. Skin/Wound Care: Monitor RUE daily for resolution of erythema 7. Fluids/Electrolytes/Nutrition: Monitor I/O--  8. MSSA PNA/Cellulitis RUE: treated with Vancomycin/rocephin x 7 days--transitioned to Cefazolin 11/30--  -transitioned to Keflex for three additional days starting today 9. HTN: Monitor BP tid--continue low dose amlodipine 10. Persistent hypokalemia:   12/3--level better, still lowm Mg++ ok   -supplement daily  11. Shocked liver: LFTs slowly resolving--recheck in am. Recheck ammonia level in am. 12. Rhabdomyolysis: CK 9,495-->27,887-->4,316.  Continue to encourage fluid intake and monitor renal status with serial checks. 13. Edema RUE:  checking follow up dopplers due to increase in edema.              -continue to promote elevation with pillows,  retrograde massage, ice, and compression with ACE bandages to reduce edema    LOS: 1 days A FACE TO FACE EVALUATION WAS PERFORMED  Meredith Staggers 04/10/2019, 10:25 AM

## 2019-04-10 NOTE — Progress Notes (Signed)
Continue to rest during shift w/o distress or discomfort, no seizures noted, period of anxiousness noted, reassurance continue, denies pain, call bell and bed alarm continue,

## 2019-04-10 NOTE — Plan of Care (Signed)
  Problem: Consults Goal: RH BRAIN INJURY PATIENT EDUCATION Description: Description: See Patient Education module for eduction specifics Outcome: Progressing   Problem: RH SKIN INTEGRITY Goal: RH STG SKIN FREE OF INFECTION/BREAKDOWN Description: Pt will remain free of falls with injury while in rehab with supervision assist  Outcome: Progressing Goal: RH STG ABLE TO PERFORM INCISION/WOUND CARE W/ASSISTANCE Description: STG Able To Perform Incision/Wound Care With Mod I Assistance. Outcome: Progressing   Problem: RH SAFETY Goal: RH STG ADHERE TO SAFETY PRECAUTIONS W/ASSISTANCE/DEVICE Description: STG Adhere to Safety Precautions With supervision Assistance/Device. Outcome: Progressing   Problem: RH COGNITION-NURSING Goal: RH STG USES MEMORY AIDS/STRATEGIES W/ASSIST TO PROBLEM SOLVE Description: STG Uses Memory Aids/Strategies With mod Assistance to Problem Solve. Outcome: Progressing   Problem: RH PAIN MANAGEMENT Goal: RH STG PAIN MANAGED AT OR BELOW PT'S PAIN GOAL Description: Pt will manage pain at 3 or less on a scale of 0-10.  Outcome: Progressing

## 2019-04-10 NOTE — Progress Notes (Signed)
Physical Therapy Assessment and Plan  Patient Details  Name: Brandon Thomas MRN: [id number] Date of Birth: [birth year]  PT Diagnosis: Abnormality of gait, Cognitive deficits, Difficulty walking and Impaired cognition Rehab Potential: Excellent ELOS: 3-5 days   Today's Date: 04/10/2019 PT Individual Time: 1310-1405 PT Individual Time Calculation (min): 55 min    Problem List:  Patient Active Problem List   Diagnosis Date Noted  . CVA (cerebral vascular accident) ([place]) 04/09/2019  . Cellulitis 04/09/2019  . Superficial venous thrombosis of arm, right 04/09/2019  . Edema of right upper arm 04/09/2019  . Substance abuse ([place]) 04/09/2019  . Encephalopathy 04/09/2019  . Anal fissure 04/15/2015  . Hypogonadism in male 04/13/2015  . Chest pain 04/13/2015  . Snoring 04/13/2015  . Fatigue 04/13/2015  . Dyslipidemia 04/13/2015  . Insomnia 07/17/2014  . Anxiety 09/08/2013  . Hyperlipidemia 08/20/2013  . Elevated LFTs 08/20/2013  . Exertional chest pain 08/20/2013    Past Medical History:  Past Medical History:  Diagnosis Date  . Depression    Well treated without meds currently  . Elevated LFTs 08/20/2013  . Hyperlipidemia 08/20/2013   Past Surgical History:  Past Surgical History:  Procedure Laterality Date  . WISDOM TOOTH EXTRACTION      Assessment & Plan Clinical Impression: Patient is a 43 year old male with history of depression, opioid abuse (s/p drug rehab but reported to be having behaviors consistent with relapse), hypogonadism (self treating with IM steroids)  who was admitted on 04/03/19 after found unresponsive with gurgling sound and CPR initiated. He was received narcan withou response and had decerebrate posturing with concerns of status epilepticus. Work up revealed lactic acidosis with  CK 9495, WBC- 22.5, shocked liver and was febrile with T-101.5. UCS positive for benzo's. He was intuabated for airway protection ands started on IVF for rhabdomyolysis,  antibiotics for sepsis felt to be due to aspiration PNA and/or RUE cellulitis. Neurology consulted for input and EEG done revealing severe diffuse encephalopathy.  MRI brain done revealing restricted diffusion throughout the hippocampi likely due to acute injury from toxic exposure.  BC/LP negative and Dr. Leonel Ramsay felt that work up pointed to fentanyl overdose- and has signed off.  RUE dopplers done due to ongoing edema and showed superficial right basilar vein thrombosis --low dose ASA added and local measures used. Patient continues to demonstrated STM deficits with deficits in problem solving and perserverative speech. CIR recommended due to functional deficits. Patient transferred to CIR on 04/09/2019 .   Patient currently requires supervision with mobility secondary to decreased memory and decreased standing balance and decreased balance strategies.  Prior to hospitalization, patient was independent  with mobility and lived with Spouse in a House home.  Home access is 2Stairs to enter.  Patient will benefit from skilled PT intervention to maximize safe functional mobility, minimize fall risk and decrease caregiver burden for planned discharge home with 24 hour supervision (2/2 cognitive impairments only). Anticipate patient will not need PT follow up at discharge.  PT - End of Session Activity Tolerance: Endurance does not limit participation in activity Endurance Deficit: No PT Assessment Rehab Potential (ACUTE/IP ONLY): Excellent PT Barriers to Discharge: Behavior;Other (comments) PT Barriers to Discharge Comments: memory impairments, history of substance abuse PT Patient demonstrates impairments in the following area(s): Behavior;Balance;Safety PT Transfers Functional Problem(s): Bed Mobility;Bed to Chair;Car;Furniture;Floor PT Locomotion Functional Problem(s): Ambulation;Stairs PT Plan PT Intensity: Minimum of 1-2 x/day ,45 to 90 minutes PT Frequency: 5 out of 7 days PT Duration  Estimated  Length of Stay: 3-5 days PT Treatment/Interventions: Ambulation/gait training;Cognitive remediation/compensation;Discharge planning;DME/adaptive equipment instruction;Functional mobility training;Psychosocial support;Pain management;Splinting/orthotics;Therapeutic Activities;Stair training;Skin care/wound management;Patient/family education;Neuromuscular re-education;Disease management/prevention;Community reintegration;Balance/vestibular training PT Transfers Anticipated Outcome(s): mod/i PT Locomotion Anticipated Outcome(s): mod/i gait in household environment, supervision in community environment PT Recommendation Recommendations for Other Services: Neuropsych consult;Therapeutic Recreation consult Therapeutic Recreation Interventions: Stress management;Outing/community reintergration Follow Up Recommendations: None Patient destination: Home Equipment Recommended: To be determined  Skilled Therapeutic Intervention  Pt in supine and agreeable to therapy, no c/o pain. Pt's wife present and pt requesting to don clothing from home. Changed into pants and shirt from home w/ supervision and verbal cues for safety w/ single leg stance while doffing and donning pants. Ambulated around unit w/o AD, negotiated stairs, and functional transfers as detailed below, all w/o AD. Additionally practiced car transfer at SUV height w/ supervision. Performed Functional Gait Assessment as detailed below, educated pt and wife on higher level balance deficits. Wife confirms that pt had no balance impairments prior to overdose event. Pt also required total assist for way-finding around unit and to remember activities from morning therapy sessions. Pt unable to recall location of room or this therapist's name.   Instructed pt in results of PT evaluation as detailed below, PT POC, rehab potential, rehab goals, and discharge recommendations. Additionally discussed CIR's policies regarding fall safety and use of  chair alarm and/or quick release belt when wife is not providing direct supervision. Pt is safe to ambulate in controlled environment, including room, toilet, and around unit w/o AD and under direct supervision of wife. CIR admission will focus on safe mobility in household and community environments. Pt and wife both verbalized understanding and in agreement w/ this plan and all education. Ended session in care of wife, all needs met.  PT Evaluation Precautions/Restrictions Precautions Precautions: Fall Restrictions Weight Bearing Restrictions: No General   Vital SignsTherapy Vitals Temp: 98.4 F (36.9 C) Pulse Rate: 86 Resp: 19 BP: 135/78 Patient Position (if appropriate): Sitting Oxygen Therapy SpO2: 98 % O2 Device: Room Air Home Living/Prior Functioning Home Living Available Help at Discharge: Available 24 hours/day Type of Home: House Home Access: Stairs to enter [employer] of Steps: 2 Entrance Stairs-Rails: Right Home Layout: Multi-level;Able to live on main level with bedroom/bathroom  Lives With: Spouse Prior Function Level of Independence: Independent with basic ADLs;Independent with homemaking with ambulation;Independent with transfers;Independent with gait  Able to Take Stairs?: Yes Driving: Yes Vocation: Full time employment Vocation Requirements: medical device salesmen Leisure: Hobbies-yes (Comment) Comments: loves motorcycles Vision/Perception  Perception Perception: Within Functional Limits Praxis Praxis: Intact  Cognition Overall Cognitive Status: Impaired/Different from baseline Arousal/Alertness: Awake/alert Orientation Level: Oriented X4 Memory: Impaired Memory Impairment: Retrieval deficit;Decreased short term memory;Decreased long term memory;Decreased recall of new information Decreased Long Term Memory: Verbal basic;Functional basic Decreased Short Term Memory: Verbal basic;Functional basic Awareness: Impaired Awareness Impairment:  Anticipatory impairment Problem Solving: Appears intact Safety/Judgment: Appears intact Sensation Sensation Light Touch: Appears Intact Coordination Gross Motor Movements are Fluid and Coordinated: Yes Motor  Motor Motor: Within Functional Limits  Mobility Bed Mobility Bed Mobility: Rolling Left;Supine to Sit;Sit to Supine;Rolling Right Rolling Right: Independent Rolling Left: Independent Supine to Sit: Independent Sit to Supine: Independent Transfers Transfers: Sit to Stand;Stand to Sit;Stand Pivot Transfers Sit to Stand: Supervision/Verbal cueing Stand to Sit: Supervision/Verbal cueing Stand Pivot Transfers: Supervision/Verbal cueing Stand Pivot Transfer Details: Verbal cues for precautions/safety Transfer (Assistive device): None Locomotion  Gait Ambulation: Yes Gait Assistance: Supervision/Verbal cueing Gait Distance (Feet): 150 Feet Assistive  device: None Gait Assistance Details: Verbal cues for precautions/safety Gait Assistance Details: verbal cues for way-finding, spatial and topographical organization of CIR unit Gait Gait: Yes Gait Pattern: Within Functional Limits High Level Ambulation High Level Ambulation: Backwards walking;Direction changes;Head turns;Sudden stops Backwards Walking: WNL Direction Changes: WNL Sudden Stops: WNL Head Turns: pt reports feeling disoriented, mild dizziness Stairs / Additional Locomotion Stairs: Yes Stairs Assistance: Supervision/Verbal cueing Stair Management Technique: No rails Number of Stairs: 12 Height of Stairs: 8 Ramp: Supervision/Verbal cueing Curb: Supervision/Verbal cueing Wheelchair Mobility Wheelchair Mobility: No  Trunk/Postural Assessment  Cervical Assessment Cervical Assessment: Within Functional Limits Thoracic Assessment Thoracic Assessment: Within Functional Limits Lumbar Assessment Lumbar Assessment: Within Functional Limits Postural Control Postural Control: Deficits on evaluation Righting  Reactions: delayed  Balance Balance Balance Assessed: Yes Standardized Balance Assessment Standardized Balance Assessment: Functional Gait Assessment Static Sitting Balance Static Sitting - Level of Assistance: 7: Independent Dynamic Sitting Balance Dynamic Sitting - Level of Assistance: 7: Independent Static Standing Balance Static Standing - Level of Assistance: 7: Independent Dynamic Standing Balance Dynamic Standing - Level of Assistance: 5: Stand by assistance Dynamic Standing - Balance Activities: Compliant surfaces;Forward lean/weight shifting;Lateral lean/weight shifting;Reaching for objects Functional Gait  Assessment Gait assessed : Yes Gait Level Surface: Walks 20 ft in less than 5.5 sec, no assistive devices, good speed, no evidence for imbalance, normal gait pattern, deviates no more than 6 in outside of the 12 in walkway width. Change in Gait Speed: Able to smoothly change walking speed without loss of balance or gait deviation. Deviate no more than 6 in outside of the 12 in walkway width. Gait with Horizontal Head Turns: Performs head turns smoothly with no change in gait. Deviates no more than 6 in outside 12 in walkway width Gait with Vertical Head Turns: Performs head turns with no change in gait. Deviates no more than 6 in outside 12 in walkway width. Gait and Pivot Turn: Pivot turns safely within 3 sec and stops quickly with no loss of balance. Step Over Obstacle: Is able to step over 2 stacked shoe boxes taped together (9 in total height) without changing gait speed. No evidence of imbalance. Gait with Narrow Base of Support: Ambulates 4-7 steps. Gait with Eyes Closed: Walks 20 ft, no assistive devices, good speed, no evidence of imbalance, normal gait pattern, deviates no more than 6 in outside 12 in walkway width. Ambulates 20 ft in less than 7 sec. Ambulating Backwards: Walks 20 ft, uses assistive device, slower speed, mild gait deviations, deviates 6-10 in outside 12  in walkway width. Steps: Alternating feet, no rail. Total Score: 27 FGA comment:: Pt reports disorientation and mild dizziness w/ horizontal and vertical head turns. Extremity Assessment  RLE Assessment RLE Assessment: Within Functional Limits LLE Assessment LLE Assessment: Within Functional Limits    Refer to Care Plan for Long Term Goals  Recommendations for other services: Neuropsych and Therapeutic Recreation  Stress management and Outing/community reintegration  Discharge Criteria: Patient will be discharged from PT if patient refuses treatment 3 consecutive times without medical reason, if treatment goals not met, if there is a change in medical status, if patient makes no progress towards goals or if patient is discharged from hospital.  The above assessment, treatment plan, treatment alternatives and goals were discussed and mutually agreed upon: by patient and by family  Smayan Hackbart Clent Demark 04/10/2019, 2:36 PM

## 2019-04-10 NOTE — Progress Notes (Signed)
Spoke with wife regarding checking on patient schedule and whether she needed to be here.Support provided

## 2019-04-10 NOTE — Progress Notes (Signed)
Inpatient Rehabilitation  Patient information reviewed and entered into eRehab system by Jadarion Halbig M. Everleigh Colclasure, M.A., CCC/SLP, PPS Coordinator.  Information including medical coding, functional ability and quality indicators will be reviewed and updated through discharge.    

## 2019-04-10 NOTE — Evaluation (Signed)
Speech Language Pathology Assessment and Plan  Patient Details  Name: Brandon Thomas MRN: [id number] Date of Birth: 01-26-1976  SLP Diagnosis: Cognitive Impairments  Rehab Potential: Good ELOS: 3-5 days    Today's Date: 04/10/2019 SLP Individual Time: 1015-1110 SLP Individual Time Calculation (min): 55 min   Problem List:  Patient Active Problem List   Diagnosis Date Noted  . CVA (cerebral vascular accident) ([place]) 04/09/2019  . Cellulitis 04/09/2019  . Superficial venous thrombosis of arm, right 04/09/2019  . Edema of right upper arm 04/09/2019  . Substance abuse ([place]) 04/09/2019  . Encephalopathy 04/09/2019  . Anal fissure 04/15/2015  . Hypogonadism in male 04/13/2015  . Chest pain 04/13/2015  . Snoring 04/13/2015  . Fatigue 04/13/2015  . Dyslipidemia 04/13/2015  . Insomnia 07/17/2014  . Anxiety 09/08/2013  . Hyperlipidemia 08/20/2013  . Elevated LFTs 08/20/2013  . Exertional chest pain 08/20/2013   Past Medical History:  Past Medical History:  Diagnosis Date  . Depression    Well treated without meds currently  . Elevated LFTs 08/20/2013  . Hyperlipidemia 08/20/2013   Past Surgical History:  Past Surgical History:  Procedure Laterality Date  . WISDOM TOOTH EXTRACTION      Assessment / Plan / Recommendation Clinical Impression Patient is a 43 y.o.malewith history of depression, polysubstance abuse (completed drug rehab but family reported recent behaviors consistent with relapse), hypogonadism (self treats with IM steroids) who was admitted on 04/03/19 found unresponsive with gurgling sounds by wife and CPR initiated. Patient treated with narcan without response and had decerebrate posturing with concerns of prolonged seizure with CK 9495, WBC- 22.5, shocked liver, was febrile with T-101.5, had lactic acidosis and UDS positive for benzo's. He was intubated for airway protection, started on broad spectrum antibiotics for sepsis due to aspiration PNA and/or RUE  cellulitis, IVF for rhabdomyolysis and hypotension.Pt ended up completing 7 days of IV antibiotics and is to switch to oral Keflex.Neurology consulted for input and MRI brain done revealing restricted diffusion injury throughout the hippocampi likely due to acute injury from toxic exposure. Blood cultures negative. CSF negative for organisms and no growth. EEG showed severe diffuse encephalopathy.RUE dopplers with acute superficial thrombosis of right basilar vein.He tolerated extubation the next day and respiratory status stable Dr. Leonel Ramsay felt that work up pointed to fentanyl overdose and no further work up indicated. Therapy evaluations completed yesterday revealing balance and cognitive deficits. CIR recommended due to functional deficits and patient admitted 04/09/2019.  Patient administered the Congistat and scored WFL on all subtests with the exception of short-term memory in which he demonstrated a moderate impairment.  Long-term memory also was impaired for biographical and personal information that was important to the patient (daughter's birthday, etc) but patient demonstrated appropriate problem solving in regards to how to attempt to recall important dates, etc. Patient also demonstrated mild impairments in anticipatory awareness in regards to how current memory deficits will impact his ability to return to work, Social research officer, government. Patient would benefit from skilled SLP intervention to maximize his cognitive functioning and overall functional independence prior to discharge.    Skilled Therapeutic Interventions          Administered a cognitive-linguistic evaluation, please see above for details. SLP facilitated session by initiating education in regards to memory compensatory strategies and how to best maximize recall of biographical information. Patient verbalized understanding but will need reinforcement. Patient independently utilized the strategy of repetition and rehearsal which appeared  successful for a short amount of time but not beneficial  in long-term carryover. Patient left upright in bed with alarm on and all needs within reach. Continue with current plan of care.     SLP Assessment  Patient will need skilled [facility] during CIR admission    Recommendations  Oral Care Recommendations: Oral care BID Recommendations for Other Services: Neuropsych consult Patient destination: Home Follow up Recommendations: Outpatient SLP;24 hour supervision/assistance Equipment Recommended: None recommended by SLP    SLP Frequency 3 to 5 out of 7 days   SLP Duration  SLP Intensity  SLP Treatment/Interventions 3-5 days  Minumum of 1-2 x/day, 30 to 90 minutes  Cognitive remediation/compensation;Internal/external aids;Cueing hierarchy;Environmental controls;Therapeutic Activities;Patient/family education;Functional tasks    Pain No/Denies Pain  Prior Functioning Type of Home: House  Lives With: Spouse Available Help at Discharge: Available 24 hours/day Vocation: Full time employment  SLP Evaluation Cognition Overall Cognitive Status: Impaired/Different from baseline Arousal/Alertness: Awake/alert Orientation Level: Oriented X4 Memory: Impaired Memory Impairment: Retrieval deficit;Decreased short term memory;Decreased long term memory;Decreased recall of new information Decreased Long Term Memory: Verbal basic;Functional basic Decreased Short Term Memory: Verbal basic;Functional basic Awareness: Impaired Awareness Impairment: Anticipatory impairment Problem Solving: Appears intact Safety/Judgment: Appears intact  Comprehension Auditory Comprehension Overall Auditory Comprehension: Appears within functional limits for tasks assessed Visual Recognition/Discrimination Discrimination: Not tested Reading Comprehension Reading Status: Not tested Expression Verbal Expression Overall Verbal Expression: Appears within functional limits for  tasks assessed Written Expression Dominant Hand: Right Written Expression: Not tested Oral Motor Oral Motor/Sensory Function Overall Oral Motor/Sensory Function: Within functional limits Motor Speech Overall Motor Speech: Appears within functional limits for tasks assessed     Short Term Goals: Week 1: SLP Short Term Goal 1 (Week 1): STGs=LTGs due to ELOS  Refer to Care Plan for Long Term Goals  Recommendations for other services: Neuropsych  Discharge Criteria: Patient will be discharged from SLP if patient refuses treatment 3 consecutive times without medical reason, if treatment goals not met, if there is a change in medical status, if patient makes no progress towards goals or if patient is discharged from hospital.  The above assessment, treatment plan, treatment alternatives and goals were discussed and mutually agreed upon: by patient  Amish Mintzer 04/10/2019, 3:05 PM

## 2019-04-10 NOTE — Evaluation (Addendum)
Occupational Therapy Assessment and Plan  Patient Details  Name: Brandon Thomas MRN: [id number] Date of Birth: 20-Apr-1976  OT Diagnosis: cognitive deficits and swelling of limb Rehab Potential:   ELOS: 3-5 days   Today's Date: 04/10/2019 OT Individual Time: 0900-1015 OT Individual Time Calculation (min): 75 min     Problem List:  Patient Active Problem List   Diagnosis Date Noted  . CVA (cerebral vascular accident) ([place]) 04/09/2019  . Cellulitis 04/09/2019  . Superficial venous thrombosis of arm, right 04/09/2019  . Edema of right upper arm 04/09/2019  . Substance abuse ([place]) 04/09/2019  . Encephalopathy 04/09/2019  . Anal fissure 04/15/2015  . Hypogonadism in male 04/13/2015  . Chest pain 04/13/2015  . Snoring 04/13/2015  . Fatigue 04/13/2015  . Dyslipidemia 04/13/2015  . Insomnia 07/17/2014  . Anxiety 09/08/2013  . Hyperlipidemia 08/20/2013  . Elevated LFTs 08/20/2013  . Exertional chest pain 08/20/2013    Past Medical History:  Past Medical History:  Diagnosis Date  . Depression    Well treated without meds currently  . Elevated LFTs 08/20/2013  . Hyperlipidemia 08/20/2013   Past Surgical History:  Past Surgical History:  Procedure Laterality Date  . WISDOM TOOTH EXTRACTION      Assessment & Plan Clinical Impression:   Brandon Thomas is a 43 year old male with history of depression, opioid abuse (s/p drug rehab but reported to be having behaviors consistent with relapse), hypogonadism (self treating with IM steroids)  who was admitted on 04/03/19 after found unresponsive with gurgling sound and CPR initiated. He was received narcan withou response and had decerebrate posturing with concerns of status epilepticus. Work up revealed lactic acidosis with  CK 9495, WBC- 22.5, shocked liver and was febrile with T-101.5. UCS positive for benzo's. He was intuabated for airway protection ands started on IVF for rhabdomyolysis, antibiotics for sepsis felt to be  due to aspiration PNA and/or RUE cellulitis. Neurology consulted for input and EEG done revealing severe diffuse encephalopathy.  MRI brain done revealing restricted diffusion throughout the hippocampi likely due to acute injury from toxic exposure.  BC/LP negative and Dr. Leonel Ramsay felt that work up pointed to fentanyl overdose- and has signed off.  RUE dopplers done due to ongoing edema and showed superficial right basilar vein thrombosis --low dose ASA added and local measures used. Patient continues to demonstrated STM deficits with deficits in problem solving and perserverative speech. CIR recommended due to functional deficits.    He is very concerned that his right arm is increasing in size. He feels numbness and tingling in his right hand, which is not new for him. Denies pain. He was more active this morning with PT and has not been elevating his hand. His wife says he was given warm compresses for his hand in acute care. He did have a doppled last night that showed no change in his superficial thrombophlebitis.  Patient currently requires supervision with basic self-care skills secondary to decreased awareness, decreased safety awareness and decreased memory and decreased balance strategies.  Prior to hospitalization, patient could complete BADL/IADL with independent .  Patient will benefit from skilled intervention to improve use of external aides for memory during ADLs and education with wife on edema management strategies prior to discharge home with care partner.  Anticipate patient will require 24 hour supervision and follow up outpatient.  OT - End of Session Activity Tolerance: Tolerates 30+ min activity without fatigue Endurance Deficit: No OT Assessment Rehab Potential (ACUTE ONLY): Fair OT  Patient demonstrates impairments in the following area(s): Cognition;Edema;Sensory OT Basic ADL's Functional Problem(s): Other (comment)(working memory during ADLs) OT Additional  Impairment(s): Fuctional Use of Upper Extremity OT Plan OT Intensity: Minimum of 1-2 x/day, 45 to 90 minutes OT Frequency: 5 out of 7 days OT Duration/Estimated Length of Stay: 3-5 days OT Treatment/Interventions: Cognitive remediation/compensation;Balance/vestibular training;Therapeutic Activities;UE/LE Coordination activities;Psychosocial support;Pain management OT Self Feeding Anticipated Outcome(s): S OT Basic Self-Care Anticipated Outcome(s): S OT Toileting Anticipated Outcome(s): S OT Bathroom Transfers Anticipated Outcome(s): S OT Recommendation Patient destination: Home Follow Up Recommendations: Outpatient OT Equipment Recommended: None recommended by OT   Skilled Therapeutic Intervention 1:1. Pt received in bed agreeable to OT. Pt completes bathing, dressing and grooming standing with no LOB and supervision. Pt endorses memory deficits impacting functional performance by repeated bathing of body parts, washing hair and application of deodorant 2x per activity. Pt even states after dressing, "did I wash my hair." OT educates on role/purpose of OT, improving functional cognition, external memory aides and education needed to be provided to wife. Pt ambulates with no AD to tx gym. Retrograde massage completed on RUE, kinesiotape applied to RUE for edema management and coban applied for compression to reduce edema. After pt returned to room and arm elevated on pillow on side table at heart height. Exited session with pt seated in w/c, call light in reach and all needs met.  Pt and wife will require education on implementation of memory strategies, external aides and edema management techniques. Wife was not present during session.  OT Evaluation Precautions/Restrictions  Precautions Precautions: Fall Precaution Comments: limited STM Restrictions Weight Bearing Restrictions: No General Chart Reviewed: Yes Family/Caregiver Present: Yes Vital Signs Therapy Vitals Temp: 98.2 F  (36.8 C) Temp Source: Oral Pulse Rate: 77 Resp: 20 BP: 125/77 Patient Position (if appropriate): Lying Oxygen Therapy SpO2: 96 % O2 Device: Room Air Pain Pain Assessment Pain Scale: 0-10 Pain Score: 6  Pain Type: Chronic pain Pain Location: Back Pain Orientation: Upper;Medial Pain Descriptors / Indicators: Constant Pain Frequency: Constant Pain Onset: On-going Patients Stated Pain Goal: 2 Pain Intervention(s): Repositioned Home Living/Prior Functioning Home Living Family/patient expects to be discharged to:: Private residence Living Arrangements: Spouse/significant other Available Help at Discharge: Family Type of Home: House Home Access: Stairs to enter Technical brewer of Steps: 2 Entrance Stairs-Rails: Right Home Layout: Two level, Able to live on main level with bedroom/bathroom Bathroom Shower/Tub: Walk-in shower IADL History Homemaking Responsibilities: Yes Meal Prep Responsibility: Secondary Laundry Responsibility: Secondary Cleaning Responsibility: Secondary Bill Paying/Finance Responsibility: Primary Shopping Responsibility: Secondary Child Care Responsibility: Primary Current License: Yes Mode of Transportation: Car Prior Function Level of Independence: Independent with basic ADLs, Independent with homemaking with ambulation  Able to Take Stairs?: Yes Driving: Yes Vocation Requirements: motorcycle; boating, Comments: works in Manufacturing engineer  ADL   Vision Baseline Vision/History: No visual deficits Patient Visual Report: No change from baseline Vision Assessment?: No apparent visual deficits Additional Comments: able to see clock on wall and read menu Perception  Perception: Within Functional Limits Praxis Praxis: Intact Cognition Overall Cognitive Status: Impaired/Different from baseline Orientation Level: Person;Place;Situation Person: Oriented Year: 2020 Month: December Day of Week: Incorrect Memory: Impaired Immediate Memory  Recall: Sock;Blue;Bed Memory Recall Sock: With Cue Memory Recall Blue: Not able to recall Memory Recall Bed: Not able to recall Sensation Sensation Light Touch: Impaired by gross assessment(impaired d/t swelling- able to notice all stimuli) Coordination Gross Motor Movements are Fluid and Coordinated: No Fine Motor Movements are Fluid and Coordinated: No  Coordination and Movement Description: decreased coordination and AROM d/t edmatous RUE Motor  Motor Motor: Within Functional Limits Mobility  Transfers Sit to Stand: Supervision/Verbal cueing Stand to Sit: Supervision/Verbal cueing  Trunk/Postural Assessment  Cervical Assessment Cervical Assessment: Within Functional Limits Thoracic Assessment Thoracic Assessment: Exceptions to WFL(thoracic scoliosis) Lumbar Assessment Lumbar Assessment: Exceptions to [facility] Postural Control Postural Control: Within Functional Limits  Balance Balance Balance Assessed: Yes Dynamic Sitting Balance Dynamic Sitting - Level of Assistance: 5: Stand by assistance Dynamic Standing Balance Dynamic Standing - Level of Assistance: 4: Min assist Extremity/Trunk Assessment RUE Assessment RUE Assessment: Exceptions to [facility] General Strength Comments: edematous, impacting AROM RUE from elbow down to digits LUE Assessment LUE Assessment: Exceptions to [facility]     Refer to Care Plan for Long Term Goals  Recommendations for other services: Neuropsych   Discharge Criteria: Patient will be discharged from OT if patient refuses treatment 3 consecutive times without medical reason, if treatment goals not met, if there is a change in medical status, if patient makes no progress towards goals or if patient is discharged from hospital.  The above assessment, treatment plan, treatment alternatives and goals were discussed and mutually agreed upon: by patient  Tonny Branch 04/10/2019, 9:22 AM

## 2019-04-11 ENCOUNTER — Inpatient Hospital Stay (HOSPITAL_COMMUNITY): Payer: BLUE CROSS/BLUE SHIELD | Admitting: Speech Pathology

## 2019-04-11 ENCOUNTER — Inpatient Hospital Stay (HOSPITAL_COMMUNITY): Payer: BLUE CROSS/BLUE SHIELD

## 2019-04-11 ENCOUNTER — Inpatient Hospital Stay (HOSPITAL_COMMUNITY): Payer: BLUE CROSS/BLUE SHIELD | Admitting: Physical Therapy

## 2019-04-11 ENCOUNTER — Inpatient Hospital Stay (HOSPITAL_COMMUNITY): Payer: BLUE CROSS/BLUE SHIELD | Admitting: Occupational Therapy

## 2019-04-11 MED ORDER — NICOTINE POLACRILEX 2 MG MT GUM
2.0000 mg | CHEWING_GUM | OROMUCOSAL | Status: DC | PRN
  Administered 2019-04-11: 2 mg via ORAL
  Filled 2019-04-11 (×4): qty 1

## 2019-04-11 MED ORDER — CEPHALEXIN 250 MG PO CAPS
500.0000 mg | ORAL_CAPSULE | Freq: Three times a day (TID) | ORAL | Status: DC
  Administered 2019-04-11 – 2019-04-14 (×9): 500 mg via ORAL
  Filled 2019-04-11 (×9): qty 2

## 2019-04-11 MED ORDER — ACETAMINOPHEN 325 MG PO TABS: 325.0000 mg | ORAL_TABLET | ORAL | Status: DC | PRN

## 2019-04-11 MED ORDER — POLYETHYLENE GLYCOL 3350 17 G PO PACK: 17.0000 g | PACK | Freq: Every day | ORAL | 0 refills | Status: DC | PRN

## 2019-04-11 NOTE — Progress Notes (Signed)
Occupational Therapy Session Note  Patient Details  Name: PATTERSON HOLLENBAUGH MRN: [id number] Date of Birth: [birth year]  Today's Date: 04/11/2019 OT Individual Time: 1330-1455 OT Individual Time Calculation (min): 85 min    Short Term Goals: Week 1:  OT Short Term Goal 1 (Week 1): STG=LTG d/t ELOS  Skilled Therapeutic Interventions/Progress Updates:    1:1. Pt received in bed with wife present and provided family education for ADLs. Pt completes bathing and dressing with supervision with wife able to see processing deficits and implicaitons during functional tasks. Pt able to completes all ADLs with set up, however repeats tasks such as setting out clothing 3x, washing body multiple times, repeating same joke/biographical information, setting up linens 2x, and applying deodorant 2x. Discussed having strict routine at home, using lists/calendars to have pt check off and writing labels of tasks on mirror for pt to check off in mirror with expo marker. OT educates wife on edema management, coban wrapping arm, theraband HEP, and putty HEP for pt to complete for movement to reduce edema. Pt walks wife out to entrance of building and is able to use external aides to locate room with VC for stopping at vending machine as wife gave pt money for snack. Forced use of RUE for money management. Exited session with pt seated in w/c, call light in reach and all needs met.  Therapy Documentation Precautions:  Precautions Precautions: Fall Precaution Comments: limited STM Restrictions Weight Bearing Restrictions: No General:   Vital Signs:   Pain:   ADL:   Vision   Perception    Praxis   Exercises:   Other Treatments:     Therapy/Group: Individual Therapy  Tonny Branch 04/11/2019, 3:07 PM

## 2019-04-11 NOTE — Progress Notes (Signed)
[place] PHYSICAL MEDICINE & REHABILITATION PROGRESS NOTE   Subjective/Complaints: Had a fair night. Complains of numbness in right hand. No pain. Right arm still swollen. Still emotional at times  ROS: Patient denies fever, rash, sore throat, blurred vision, nausea, vomiting, diarrhea, cough, shortness of breath or chest pain,  , headache, or mood change.    Objective:   No results found. Recent Labs    04/09/19 0408 04/10/19 0618  WBC 9.0 8.4  HGB 13.4 14.6  HCT 42.4 45.6  PLT 323 351   Recent Labs    04/09/19 0408 04/10/19 0618  NA 143 142  K 3.0* 3.4*  CL 105 105  CO2 26 26  GLUCOSE 113* 93  BUN 9 12  CREATININE 1.16 1.08  CALCIUM 8.5* 8.9    Intake/Output Summary (Last 24 hours) at 04/11/2019 1027 Last data filed at 04/11/2019 0806 Gross per 24 hour  Intake 864 ml  Output -  Net 864 ml     Physical Exam: Vital Signs Blood pressure 116/63, pulse 72, temperature 98.2 F (36.8 C), temperature source Oral, resp. rate 18, weight 94.3 kg, SpO2 96 %. Constitutional: No distress . Vital signs reviewed. HEENT: EOMI, oral membranes moist Neck: supple Cardiovascular: RRR without murmur. No JVD    Respiratory: CTA Bilaterally without wheezes or rales. Normal effort    GI: BS +, non-tender, non-distended  Musculoskeletal:  Comments: RUE-1 to  2+ edema (biceps to forearm) Some induration around biceps area and antecubital fossa, only mild erthema,abrasions.   Neurological: He is alert.AOx2. Unable to state date. Motor 5/5 except for RUE which is 4/5 mostly due to pain. Mild tingling/sensory loss along median n distribution of right hand, perhaps radial sensory distribution too to a lesser extent.  Some processing delays, STM deficits.  Skin: He isnot diaphoretic. Psychiatric: tearful at times, pleasant though    Assessment/Plan: 1. Functional deficits secondary to toxic/anoxic encephalopathy which require 3+ hours per day of interdisciplinary therapy in  a comprehensive inpatient rehab setting.  Physiatrist is providing close team supervision and 24 hour management of active medical problems listed below.  Physiatrist and rehab team continue to assess barriers to discharge/monitor patient progress toward functional and medical goals  Care Tool:  Bathing    Body parts bathed by patient: Right arm, Left arm, Chest, Abdomen, Front perineal area, Buttocks, Right upper leg, Left upper leg, Right lower leg, Left lower leg, Face         Bathing assist Assist Level: Supervision/Verbal cueing     Upper Body Dressing/Undressing Upper body dressing   What is the patient wearing?: Pull over shirt    Upper body assist Assist Level: Supervision/Verbal cueing    Lower Body Dressing/Undressing Lower body dressing      What is the patient wearing?: Pants     Lower body assist Assist for lower body dressing: Supervision/Verbal cueing     Toileting Toileting    Toileting assist Assist for toileting: Supervision/Verbal cueing     Transfers Chair/bed transfer  Transfers assist     Chair/bed transfer assist level: Supervision/Verbal cueing     Locomotion Ambulation   Ambulation assist      Assist level: Supervision/Verbal cueing Assistive device: No Device Max distance: >150'   Walk 10 feet activity   Assist     Assist level: Supervision/Verbal cueing Assistive device: No Device   Walk 50 feet activity   Assist    Assist level: Supervision/Verbal cueing Assistive device: No Device    Walk  150 feet activity   Assist    Assist level: Supervision/Verbal cueing Assistive device: No Device    Walk 10 feet on uneven surface  activity   Assist     Assist level: Supervision/Verbal cueing Assistive device: Other (comment)(no device)   Wheelchair     Assist Will patient use wheelchair at discharge?: No   Wheelchair activity did not occur: N/A         Wheelchair 50 feet with 2 turns  activity    Assist    Wheelchair 50 feet with 2 turns activity did not occur: N/A       Wheelchair 150 feet activity     Assist  Wheelchair 150 feet activity did not occur: N/A       Blood pressure 116/63, pulse 72, temperature 98.2 F (36.8 C), temperature source Oral, resp. rate 18, weight 94.3 kg, SpO2 96 %.  Medical Problem List and Plan: 1.  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              -patient may shower             -ELOS 04/14/2019  -Continue CIR therapies today including PT and OT  2.  Antithrombotics: -DVT/anticoagulation:  Pharmaceutical: Heparin, dopplers pending             -antiplatelet therapy: low dose ASA 3. Pain Management: tylenol prn 4. Mood: LCSW to follow for evaluation and support.              -antipsychotic agents: N/A             -team to provide ego support.  5. Neuropsych: This patient is not fully capable of making decisions on his own behalf.             -Would benefit from neuropsych evaluation. will likely need to arrange as outpt 6. Skin/Wound Care: Monitor RUE daily for resolution of erythema 7. Fluids/Electrolytes/Nutrition: Monitor I/O--  8. MSSA PNA/Cellulitis RUE: treated with Vancomycin/rocephin x 7 days--transitioned to Cefazolin 11/30  -transitioned to Keflex which was planned for 2 more days including today---will continue longer, likely another week  -dopplers negative RUE  -can use moist heat for indurated area  -otherwise ACE, elevation, massage, ice  -likely mild median nerve compression at antecub fossa,forearm---he has normal pulses and strong grip----observe for now 9. HTN: Monitor BP tid--continue low dose amlodipine 10. Persistent hypokalemia:   12/3--level better, still lowm Mg++ ok  12/4 continue supplement daily  11. Shocked liver: LFTs slowly resolving--improved further on 12/3 12. Rhabdomyolysis: CK 9,495-->27,887-->4,316.  Continue to encourage fluid intake and  monitor renal status with serial checks.      LOS: 2 days A FACE TO FACE EVALUATION WAS PERFORMED  Meredith Staggers 04/11/2019, 10:27 AM

## 2019-04-11 NOTE — Discharge Summary (Signed)
Physician Discharge Summary  Patient ID: Brandon Thomas MRN: [id number] DOB/AGE: February 22, 1976 43 y.o.  Admit date: 04/09/2019 Discharge date: 04/14/2019  Discharge Diagnoses:  Principal Problem:   Encephalopathy Active Problems:   Elevated LFTs   Superficial venous thrombosis of arm, right   Edema of right upper arm   Substance abuse ([place])   Moderate hypoxic-ischemic encephalopathy   Discharged Condition: stable   Significant Diagnostic Studies: Mr Brain Wo Contrast  Result Date: 04/05/2019 CLINICAL DATA:  Encephalopathy. Anterograde amnesia. History of polysubstance abuse. Found down. Clinical concern for hippocampal injury associated with fentanyl. EXAM: MRI HEAD WITHOUT CONTRAST TECHNIQUE: Multiplanar, multiecho pulse sequences of the brain and surrounding structures were obtained without intravenous contrast. COMPARISON:  Head CT 04/03/2019 FINDINGS: The patient was unable to tolerate the complete examination. Axial and coronal diffusion, susceptibility weighted imaging, sagittal T1, axial T2, and axial FLAIR sequences were obtained with some being severely motion degraded. Brain: There is prominent restricted diffusion and T2 hyperintensity diffusely throughout the hippocampi bilaterally. No restricted diffusion is present elsewhere. No gross intracranial hemorrhage, intracranial mass effect, or extra-axial fluid collection is identified. The ventricles and sulci are normal in size. Vascular: Major intracranial vascular flow voids are grossly preserved. Skull and upper cervical spine: Unremarkable bone marrow signal. Sinuses/Orbits: Grossly unremarkable orbits. Grossly clear sinuses. Other: None. IMPRESSION: 1. Severely motion degraded, incomplete examination. 2. Restricted diffusion diffusely throughout the hippocampi likely reflecting acute injury from toxic exposure given clinical history. Electronically Signed   By: Logan Bores M.D.   On: 04/05/2019 06:33     Vas [place] Upper  Extremity Venous Duplex  Result Date: 04/08/2019 UPPER VENOUS STUDY  Indications: Swelling Limitations: Body habitus. Comparison Study: 04/06/19 Performing Technologist: Estell Manor, RVT  Examination Guidelines: A complete evaluation includes B-mode imaging, spectral Doppler, color Doppler, and power Doppler as needed of all accessible portions of each vessel. Bilateral testing is considered an integral part of a complete examination. Limited examinations for reoccurring indications may be performed as noted.  Right Findings: +----------+------------+---------+-----------+--------------+----------------+ RIGHT     CompressiblePhasicitySpontaneous  Properties      Summary      +----------+------------+---------+-----------+--------------+----------------+ IJV           Full       Yes       Yes                                   +----------+------------+---------+-----------+--------------+----------------+ Subclavian    Full       Yes       Yes                                   +----------+------------+---------+-----------+--------------+----------------+ Axillary      Full       Yes       Yes                                   +----------+------------+---------+-----------+--------------+----------------+ Brachial      Full       Yes       Yes                                   +----------+------------+---------+-----------+--------------+----------------+ Radial  Full                                                       +----------+------------+---------+-----------+--------------+----------------+ Ulnar         Full                                                       +----------+------------+---------+-----------+--------------+----------------+ Cephalic      Full                                                       +----------+------------+---------+-----------+--------------+----------------+ Basilic     Partial                            softly          Age                                                    echogenic    Indeterminate   +----------+------------+---------+-----------+--------------+----------------+  Left Findings: +----------+------------+---------+-----------+----------+----------+ LEFT      CompressiblePhasicitySpontaneousProperties Summary   +----------+------------+---------+-----------+----------+----------+ Subclavian    Full       Yes       Yes              PICC noted +----------+------------+---------+-----------+----------+----------+  Summary:  Right: No evidence of deep vein thrombosis in the upper extremity. Findings consistent with age indeterminate superficial vein thrombosis involving the right basilic vein. SVT appears unchanged from prior study, 04/06/19.  Left: No evidence of thrombosis in the subclavian.  *See table(s) above for measurements and observations.  Diagnosing physician: Deitra Mayo MD Electronically signed by Deitra Mayo MD on 04/08/2019 at 5:42:00 PM.    Final       Labs:  Basic Metabolic Panel: BMP Latest Ref Rng & Units 04/14/2019 04/10/2019 04/09/2019  Glucose 70 - 99 mg/dL 94 93 113(H)  BUN 6 - 20 mg/dL _0 Creatinine 0.61 - 1.24 mg/dL 1.10 1.08 1.16  Sodium 135 - 145 mmol/L 140 142 143  Potassium 3.5 - 5.1 mmol/L 4.4 3.4(L) 3.0(L)  Chloride 98 - 111 mmol/L 104 105 105  CO2 22 - 32 mmol/L _1 Calcium 8.9 - 10.3 mg/dL 9.4 8.9 8.5(L)    Hepatic Function Latest Ref Rng & Units 04/10/2019 04/09/2019 04/08/2019  Total Protein 6.5 - 8.1 g/dL 6.0(L) 5.5(L) 5.7(L)  Albumin 3.5 - 5.0 g/dL 3.3(L) 3.0(L) 3.1(L)  AST 15 - 41 U/L 62(H) 102(H) 135(H)  ALT 0 - 44 U/L 165(H) 200(H) 207(H)  Alk Phosphatase 38 - 126 U/L 56 51 47  Total Bilirubin 0.3 - 1.2 mg/dL 0.6 0.3 0.8  Bilirubin, Direct 0.0 - 0.2 mg/dL - <0.1 0.2   CBC: CBC Latest Ref Rng & Units 04/14/2019 04/10/2019 04/09/2019  WBC 4.0 - 10.5 K/uL 8.3 8.4  9.0  Hemoglobin 13.0 - 17.0  g/dL 15.7 14.6 13.4  Hematocrit 39.0 - 52.0 % 48.5 45.6 42.4  Platelets 150 - 400 K/uL 484(H) 351 323    CBG: Recent Labs  Lab 04/08/19 2035 04/08/19 2345 04/09/19 0355 04/09/19 0733 04/09/19 1135  GLUCAP 97 94 109* 98 99    Brief HPI:   Brandon Thomas is a 43 y.o. male with history of depression, opioid abuse (s/p drug rehab but reported to be having behaviors consistent with relapse), hypogonadism (self treating with IM steroids)  who was admitted on 04/03/19 after found unresponsive with gurgling sound and CPR initiated. He was received narcan withou response and had decerebrate posturing with concerns of status epilepticus. Work up revealed lactic acidosis with  CK 9495, WBC- 22.5, shocked liver and was febrile with T-101.5. UCS positive for benzo's. He was intuabated for airway protection ands started on IVF for rhabdomyolysis, antibiotics for sepsis felt to be due to aspiration PNA and/or RUE cellulitis. Neurology consulted for input and EEG done revealing severe diffuse encephalopathy.  MRI brain done revealing restricted diffusion throughout the hippocampi likely due to acute injury from toxic exposure.  BC/LP negative and Dr. Leonel Ramsay felt that work up pointed to fentanyl overdose- and has signed off.  RUE dopplers done due to ongoing edema and showed superficial right basilar vein thrombosis --low dose ASA added and local measures used. Patient continues to demonstrated STM deficits with deficits in problem solving and perserverative speech. CIR recommended due to functional deficits.     Hospital Course: WARRICK LLERA was admitted to rehab 04/09/2019 for inpatient therapies to consist of PT, ST and OT at least three hours five days a week. Past admission physiatrist, therapy team and rehab RN have worked together to provide customized collaborative inpatient rehab. He was maintained on keflex for 3 additional days to complete 2 weeks course of antibiotic regimen. Edema  RUE has been monitored. He was educated on elevation and compressive dressing used to help with edema control.  RUE continued to have cellulitic changes therefore he was maintained on Keflex and is to continue on this for additional week after discharge. Follow up BMET showed hypokalemia which has resolved with brief supplementation. CBC showed that H/H and white count is stable. His blood pressures were monitored on TID basis and has been stable on Norvasc. He did report dysuria and UA was negative. He was treated with pyridium without improvement therefore UCS ordered at discharge and showed no growth.    Abnormal LFTs are resolving and recommend follow up labs in a couple of weeks to monitor for normalization. Wife did report patient being on Zoloft prior to admission and this was resumed prior to discharge. Po intake has been good and he is continent of bowel and bladder. Dr. Sima Matas (neuropsychologist) evaluated patient on 12/04 and followed up 12/07 prior to discharge. patient and wife have been educated on signs and symptoms of hippocampal injury and he has shown significant improvements in memory and awareness during his stay. He will require 24 hours supervision due to [facility] deficits affecting storage and organization of new information --duration and pace of recovery difficult to determine at this time. He will continue to receive follow up outpatient ST at [facility] after discharge  Rehab course: During patient's stay in rehab weekly team conferences were held to monitor patient's progress, set goals and discuss barriers to discharge. At admission, patient required supervision with ADL tasks and with mobility.  He exhibited  moderate deficits in STM as well as LTM and mild impairments in processing.  He  has had improvement in activity tolerance, balance, postural control as well as ability to compensate for deficits. He has had improvement in functional use RUE as well as improvement in  awareness and recall. He is able to complete ADL tasks at modified independent level. He is modified independent for transfers and to ambulate with assistive device. He is unable to carryover day to day information with mid to mod cues and requires min cues to utilize external aids.    Disposition: Home  Diet: Regular.   Special Instructions: 1. No driving or strenuous activity till cleared by MD. 2. Drink plenty of fluids. 3. Continue to apply compressive wrap on RUE for edema control. Elevate RUE when seated.    Discharge Instructions    Ambulatory referral to Physical Medicine Rehab   Complete by: As directed    1-2 weeks TC appointment   Ambulatory referral to Psychology   Complete by: As directed    Hospital follow up     Allergies as of 04/14/2019      Reactions   Atorvastatin Other (See Comments)   RUQ pain   Crestor [rosuvastatin] Other (See Comments)   Elevated LFTs      Medication List    TAKE these medications   acetaminophen 325 MG tablet Commonly known as: TYLENOL Take 1-2 tablets (325-650 mg total) by mouth every 4 (four) hours as needed for mild pain.   amLODipine 2.5 MG tablet Commonly known as: NORVASC Take 1 tablet (2.5 mg total) by mouth daily.   aspirin 81 MG EC tablet Take 1 tablet (81 mg total) by mouth daily.   cephALEXin 500 MG capsule Commonly known as: KEFLEX Take 1 capsule (500 mg total) by mouth every 8 (eight) hours. What changed: when to take this   nicotine polacrilex 2 MG gum Commonly known as: NICORETTE Take 1 each (2 mg total) by mouth as needed for smoking cessation.   pantoprazole 40 MG tablet Commonly known as: PROTONIX Take 1 tablet (40 mg total) by mouth daily.   polyethylene glycol 17 g packet Commonly known as: MIRALAX / GLYCOLAX Take 17 g by mouth daily as needed for mild constipation.   sertraline 100 MG tablet Commonly known as: Zoloft Take 1/2 a pill daily for a week than increase to one pill daily       Follow-up Information    Meredith Staggers, MD Follow up.   Specialty: Physical Medicine and Rehabilitation Contact information: [place] [address] [phone]        Marcial Pacas, [place]. Call on 04/15/2019.   Specialty: Family Medicine Why: for post hospital follow up Contact information: [place] [address] [phone]           Signed: Bary Leriche 04/15/2019, 9:56 AM

## 2019-04-11 NOTE — IPOC Note (Signed)
Overall Plan of Care [facility]) Patient Details Name: TIEGAN TERPSTRA MRN: [id number] DOB: May 01, 1976  Admitting Diagnosis: Encephalopathy  Hospital Problems: Principal Problem:   Encephalopathy Active Problems:   Moderate hypoxic-ischemic encephalopathy     Functional Problem List: Nursing Pain, Safety, Edema, Skin Integrity  PT Behavior, Balance, Safety  OT Cognition, Edema, Sensory  SLP Cognition  TR         Basic ADL's: OT Other (comment)(working memory during ADLs)     Advanced  ADL's: OT       Transfers: PT Bed Mobility, Bed to Chair, Car, [employer], Floor  OT       Locomotion: PT Ambulation, Stairs     Additional Impairments: OT Fuctional Use of Upper Extremity  SLP Social Cognition   Memory, Awareness  TR      Anticipated Outcomes Item Anticipated Outcome  Self Feeding S  Swallowing      Basic self-care  S  Toileting  S   Bathroom Transfers S  Bowel/Bladder  Pt will manage bowel and bladder with mod I assist  Transfers  mod/i  Locomotion  mod/i gait in household environment, supervision in community environment  Communication     Cognition  Min A  Pain  Pt will manage pain a 4 or less on a scale of 0-10.  Safety/Judgment  Pt will remain free of falls with injury while in rehab with supervision assist   Therapy Plan: PT Intensity: Minimum of 1-2 x/day ,45 to 90 minutes PT Frequency: 5 out of 7 days PT Duration Estimated Length of Stay: 3-5 days OT Intensity: Minimum of 1-2 x/day, 45 to 90 minutes OT Frequency: 5 out of 7 days OT Duration/Estimated Length of Stay: 3-5 days SLP Intensity: Minumum of 1-2 x/day, 30 to 90 minutes SLP Frequency: 3 to 5 out of 7 days SLP Duration/Estimated Length of Stay: 3-5 days   Due to the current state of emergency, patients may not be receiving their 3-hours of Medicare-mandated therapy.   Team Interventions: Nursing Interventions Patient/Family Education, Discharge Planning, Pain Management,  Cognitive Remediation/Compensation  PT interventions Ambulation/gait training, Cognitive remediation/compensation, Discharge planning, DME/adaptive equipment instruction, Functional mobility training, Psychosocial support, Pain management, Splinting/orthotics, Therapeutic Activities, Stair training, Skin care/wound management, Patient/family education, Neuromuscular re-education, Disease management/prevention, Community reintegration, Training and development officer  OT Interventions Cognitive remediation/compensation, Training and development officer, Therapeutic Activities, UE/LE Coordination activities, Psychosocial support, Pain management  SLP Interventions Cognitive remediation/compensation, Internal/external aids, English as a second language teacher, Environmental controls, Therapeutic Activities, Patient/family education, Functional tasks  TR Interventions    SW/CM Interventions Discharge Planning, Psychosocial Support, Patient/Family Education   Barriers to Discharge MD  Medical stability  Nursing      PT Behavior, Other (comments) memory impairments, history of substance abuse  OT      SLP      SW       Team Discharge Planning: Destination: PT-Home ,OT- Home , SLP-Home Projected Follow-up: PT-None, OT-  Outpatient OT, SLP-Outpatient SLP, 24 hour supervision/assistance Projected Equipment Needs: PT-To be determined, OT- None recommended by OT, SLP-None recommended by SLP Equipment Details: PT- , OT-  Patient/family involved in discharge planning: PT- Family member/caregiver, Patient,  OT-Patient, SLP-Patient  MD ELOS: 5 days Medical Rehab Prognosis:  Excellent Assessment: The patient has been admitted for CIR therapies with the diagnosis of hypoxic encephalopathy d/t drug overdose. The team will be addressing functional mobility, strength, stamina, balance, safety, adaptive techniques and equipment, self-care, bowel and bladder mgt, patient and caregiver education, cognition, communication, ego support,  pain control, community reentry.  Goals have been set at supervision for self-care, mod I for transfers and gait, supervision to min assist for cognition.   Due to the current state of emergency, patients may not be receiving their 3 hours per day of Medicare-mandated therapy.    Meredith Staggers, MD, FAAPMR      See Team Conference Notes for weekly updates to the plan of care

## 2019-04-11 NOTE — Consult Note (Addendum)
Neuropsychological Consultation   Patient:   Brandon Thomas   DOB:   1976/03/29  MR Number:  [id number]  Location:  [facility] [id number] [place] [id number] Dept: [phone] GUY: [phone]           Date of Service:   04/10/2011  Start Time:   5:30 PM End Time:   7 PM  Provider/Observer:  Ilean Skill, Psy.D.       Clinical Neuropsychologist       Billing Code/Service: 63875  Chief Complaint:    Brandon Thomas. Altamura is a 43 year old male with a history of depression, opioid abuse and previous drug treatment/rehab, and hypogonadism.  The patient was admitted on 04/03/2019 after being found by his wife unresponsive and gurgling sounds and CPR was initiated.  He did receive Narcan without response and had decerebrate posturing and concerns of seizure.  Urine drug screen was positive for benzo diazepam but not positive for opiates.  However, specific opiate testing for fentanyl was not conducted and the patient has acknowledged that he was using fentanyl at the time of his overdose.  The patient was intubated for airway protection and needed IVF for rhabdo and antibiotics for sepsis felt due to aspiration pneumonia and/or possible right upper extremity cellulitis.  EEG revealed severe diffuse encephalopathy.  MRI brain done revealed restricted diffusion throughout the hippocampi likely due to acute injury from toxic exposure and/or hypoxic event.  The patient has continued to have significant short-term memory deficits and problem-solving but his perseverative speech and orientation have significantly improved during his comprehensive inpatient rehabilitation stay.  The patient continues to complain about his right arm being swollen and having strange sensory sensations with his right arm and hand.  Reason for Service:  The patient was referred for neuropsychological consultation due to  coping and adjustment issues.  While there are significant cognitive deficits formal testing was not conducted as the patient is likely to be showing some potential changes on almost a daily basis going forward.  Below is the HPI for the current admission.  HPI:  Brandon Thomas is a 44 year old male with history of depression, opioid abuse (s/p drug rehab but reported to be having behaviors consistent with relapse), hypogonadism (self treating with IM steroids)  who was admitted on 04/03/19 after found unresponsive with gurgling sound and CPR initiated. He was received narcan withou response and had decerebrate posturing with concerns of status epilepticus. Work up revealed lactic acidosis with  CK 9495, WBC- 22.5, shocked liver and was febrile with T-101.5. UCS positive for benzo's. He was intuabated for airway protection ands started on IVF for rhabdomyolysis, antibiotics for sepsis felt to be due to aspiration PNA and/or RUE cellulitis. Neurology consulted for input and EEG done revealing severe diffuse encephalopathy.  MRI brain done revealing restricted diffusion throughout the hippocampi likely due to acute injury from toxic exposure.  BC/LP negative and Dr. Leonel Ramsay felt that work up pointed to fentanyl overdose- and has signed off.  RUE dopplers done due to ongoing edema and showed superficial right basilar vein thrombosis --low dose ASA added and local measures used. Patient continues to demonstrated STM deficits with deficits in problem solving and perserverative speech. CIR recommended due to functional deficits.    He is very concerned that his right arm is increasing in size. He feels numbness and tingling in his right hand, which is not new for him. Denies  pain. He was more active this morning with PT and has not been elevating his hand. His wife says he was given warm compresses for his hand in acute care. He did have a doppled last night that showed no change in his superficial  thrombophlebitis.   Current Status:  During the 1-1/2 hours that I spent with the patient and his wife, the patient clearly demonstrated significant short-term memory and learning deficits.  However, there were some aspects of the past couple of days that he was able to recall but he tended to confabulate with logical but inaccurate answers for many things.  The patient readily admitted to his relapsing for opiate abuse and use of what he believed to be fentanyl in capsule form.  The patient reports that he had a period of being off of work and was "bored" and made contact with an old associate to require opiate drug.  The patient does admit to remembering events leading up to his overdose but does not remember the overdose time itself and was down for many hours before he was discovered unconscious by his wife.  The patient had been participating in NA for some time but some of the meetings were discontinued because of Covid issues and his activity and needs in a efforts had diminished significantly.  The patient is showing clear symptoms of hippocampal injury/damage with significant impairments for storage and organization of newly learned information.  The patient's immediate orientation appears to be improving significantly and he has become well aware of his current circumstance and the events that led up to it.  However, he is still having deficits with regard to new information storage and learning and he will typically confabulate a logical answer and have difficulty determining whether it was accurate or not.  The patient denies any significant depression symptoms with the exception of being extremely upset about what is happening transpired and the fact that he has suffered this significant injury/event.  At this point it is unclear how he will progress as far as recovery so at this point making a specific prognosis is very difficult.  Behavioral Observation: [name]  presents as a 43  y.o.-year-old Right Caucasian Male who appeared his stated age. his dress was Appropriate and he was Well Groomed and his manners were Appropriate to the situation.  his participation was indicative of Appropriate and Redirectable behaviors.  There were any physical disabilities noted.  he displayed an appropriate level of cooperation and motivation.     Interactions:    Active Appropriate and Redirectable  Attention:   abnormal and attention span appeared shorter than expected for age  Memory:   abnormal; remote memory intact, recent memory impaired  Visuo-spatial:  not examined  Speech (Volume):  normal  Speech:   normal; normal  Thought Process:  Coherent and Relevant  Though Content:  WNL; not suicidal and not homicidal  Orientation:   person, place and situation  Judgment:   Fair  Planning:   Poor  Affect:    Appropriate  Mood:    Dysphoric and Irritable  Insight:   Fair  Intelligence:   normal  Substance Use:  There is a documented history of prescription drug and Opiate abuse confirmed by the patient.    Medical History:   Past Medical History:  Diagnosis Date  . Depression    Well treated without meds currently  . Elevated LFTs 08/20/2013  . Hyperlipidemia 08/20/2013    Psychiatric History:  The patient has  a past psychiatric history including depression and significant history of opiate abuse.  Initial use of opiates that led to abuse/dependence was a broken hand during a significantly challenging emotional/stressful period of his life.  Family Med/Psych History:  Family History  Adopted: Yes    Risk of Suicide/Violence: low patient denies any suicidal or homicidal ideation.  Impression/DX:  Yacob L. Fitz is a 43 year old male with a history of depression, opioid abuse and previous drug treatment/rehab, and hypogonadism.  The patient was admitted on 04/03/2019 after being found by his wife unresponsive and gurgling sounds and CPR was initiated.  He  did receive Narcan without response and had decerebrate posturing and concerns of seizure.  Urine drug screen was positive for benzo diazepam but not positive for opiates.  However, specific opiate testing for fentanyl was not conducted and the patient has acknowledged that he was using fentanyl at the time of his overdose.  The patient was intubated for airway protection and needed IVF for rhabdo and antibiotics for sepsis felt due to aspiration pneumonia and/or possible right upper extremity cellulitis.  EEG revealed severe diffuse encephalopathy.  MRI brain done revealed restricted diffusion throughout the hippocampi likely due to acute injury from toxic exposure and/or hypoxic event.  The patient has continued to have significant short-term memory deficits and problem-solving but his perseverative speech and orientation have significantly improved during his comprehensive inpatient rehabilitation stay.  The patient continues to complain about his right arm being swollen and having strange sensory sensations with his right arm and hand.  During the 1-1/2 hours that I spent with the patient and his wife, the patient clearly demonstrated significant short-term memory and learning deficits.  However, there were some aspects of the past couple of days that he was able to recall but he tended to confabulate with logical but inaccurate answers for many things.  The patient readily admitted to his relapsing for opiate abuse and use of what he believed to be fentanyl in capsule form.  The patient reports that he had a period of being off of work and was "bored" and made contact with an old associate to require opiate drug.  The patient does admit to remembering events leading up to his overdose but does not remember the overdose time itself and was down for many hours before he was discovered unconscious by his wife.  The patient had been participating in NA for some time but some of the meetings were discontinued  because of Covid issues and his activity and needs in a efforts had diminished significantly.  The patient is showing clear symptoms of hippocampal injury/damage with significant impairments for storage and organization of newly learned information.  The patient's immediate orientation appears to be improving significantly and he has become well aware of his current circumstance and the events that led up to it.  However, he is still having deficits with regard to new information storage and learning and he will typically confabulate a logical answer and have difficulty determining whether it was accurate or not.  The patient denies any significant depression symptoms with the exception of being extremely upset about what is happening transpired and the fact that he has suffered this significant injury/event.  At this point it is unclear how he will progress as far as recovery so at this point making a specific prognosis is very difficult.   Disposition/Plan:  I will follow-up with the patient on Monday before he is discharged.  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  The patient does have someone (his wife) who will help take care of him and he will need initially to have 24-hour assistance but the duration of that assistance is undetermined as we are not able to predict the pace and level of recovery that we he will have going forward.  Diagnosis:    Encephalopathy - Plan: Ambulatory referral to Physical Medicine Rehab         Electronically Signed   _______________________ Ilean Skill, Psy.D.

## 2019-04-11 NOTE — Plan of Care (Signed)
  Problem: Consults Goal: RH BRAIN INJURY PATIENT EDUCATION Description: Description: See Patient Education module for eduction specifics Outcome: Progressing   Problem: RH SKIN INTEGRITY Goal: RH STG SKIN FREE OF INFECTION/BREAKDOWN Description: Pt will remain free of falls with injury while in rehab with supervision assist  Outcome: Progressing Goal: RH STG ABLE TO PERFORM INCISION/WOUND CARE W/ASSISTANCE Description: STG Able To Perform Incision/Wound Care With Mod I Assistance. Outcome: Progressing   Problem: RH SAFETY Goal: RH STG ADHERE TO SAFETY PRECAUTIONS W/ASSISTANCE/DEVICE Description: STG Adhere to Safety Precautions With supervision Assistance/Device. Outcome: Progressing   Problem: RH COGNITION-NURSING Goal: RH STG USES MEMORY AIDS/STRATEGIES W/ASSIST TO PROBLEM SOLVE Description: STG Uses Memory Aids/Strategies With mod Assistance to Problem Solve. Outcome: Progressing   Problem: RH PAIN MANAGEMENT Goal: RH STG PAIN MANAGED AT OR BELOW PT'S PAIN GOAL Description: Pt will manage pain at 3 or less on a scale of 0-10.  Outcome: Progressing

## 2019-04-11 NOTE — Progress Notes (Signed)
Speech Language Pathology Daily Session Note  Patient Details  Name: WYNTER GRAVE MRN: [id number] Date of Birth: [birth year]  Today's Date: 04/11/2019 SLP Individual Time: 4132-4401 SLP Individual Time Calculation (min): 55 min  Short Term Goals: Week 1: SLP Short Term Goal 1 (Week 1): STGs=LTGs due to ELOS  Skilled Therapeutic Interventions: Skilled treatment session focused on cognitive goals. SLP facilitated session by initiating use of a memory notebook with tabs to maximize organization and use for recall and carryover. Patient recalled information like important dates as they related to his family with Min A verbal cues but required Max A verbal cues to recall information from current hospitalization. Patient also recalled events from previous therapy session with supervision verbal cues. Patient able to utilize memory notebook to recall information as well as answers to his own questions with Min verbal cues and extra time throughout session. Patient was also tearful throughout session. Patient left upright in bed with alarm on and all needs within reach. Continue with current plan of care.       Pain Pain Assessment Pain Scale: 0-10 Pain Score: 0-No pain  Therapy/Group: Individual Therapy  Jasean Ambrosia 04/11/2019, 12:37 PM

## 2019-04-11 NOTE — Progress Notes (Signed)
Physical Therapy Session Note  Patient Details  Name: Brandon Thomas MRN: [id number] Date of Birth: 08/30/1975  Today's Date: 04/11/2019 PT Individual Time: 1110-1205 PT Individual Time Calculation (min): 55 min   Short Term Goals: Week 1:  PT Short Term Goal 1 (Week 1): =LTGs due to ELOS  Skilled Therapeutic Interventions/Progress Updates:   Pt in supine and agreeable to therapy, no c/o pain but c/o soreness in RUE. Requesting therapist fashion sling for him to wear during gait as the dependent positioning increases his pain. Supervision bed mobility and ambulated around unit and hospital w/ supervision, no AD and >1000' at a time. Session focused on working memory during functional mobility, way-finding, and dual task. Worked on finding items for recipe in ADL kitchen. Able to remember 2-3 items at a time, for up to 30 sec. Unable to look at recipe and then collected items to identify what was missing w/o cues. Performed this x2 w/ tacos and then spaghetti. Pt would often choose correct items, but primarily because that is what he would put in tacos or spaghetti and not necessarily what the recipe called for. Wrote down 2 locations for pt to find in hospital using external cues for way-finding. Needed mod visual and verbal cues overall to find atrium and cafeteria. However, pt able to attend to task of way-finding and navigating around hospital for 20+ minutes w/o cues as to the purpose of the task. Returned to unit w/ mod verbal and visual cues to find room, needed to tell pt his room number. Seated rest break in room while pt correctly recalled 50% of the tasks already performed in the session for therapist to write in memory notebook. Returned to therapy gym and worked on remembering 1-2 playing cards at a time for 15-30 sec while ambulating from one side of gym to the other to retrieve them. No cues needed. Then performed simple pipe tree task from memory. Approx halfway through pt reported  he couldn't remember what he was doing, but able to accurately recall design w/ therapist asking pt to outline the design with his hands. Returned to room and ended session in supine, all needs in reach. RUE elevated on pillows w/ hot pack, per RN's request.   Therapy Documentation Precautions:  Precautions Precautions: Fall Precaution Comments: limited STM Restrictions Weight Bearing Restrictions: No Vital Signs:   Pain: Pain Assessment Pain Scale: 0-10 Pain Score: 0-No pain  Therapy/Group: Individual Therapy  Doni Widmer Clent Demark 04/11/2019, 12:37 PM

## 2019-04-11 NOTE — Progress Notes (Signed)
Occupational Therapy Session Note  Patient Details  Name: Brandon Thomas MRN: [id number] Date of Birth: 1976/05/04  Today's Date: 04/11/2019 OT Individual Time: 0900-0930 OT Individual Time Calculation (min): 30 min    Short Term Goals: Week 1:  OT Short Term Goal 1 (Week 1): STG=LTG d/t ELOS  Skilled Therapeutic Interventions/Progress Updates:    1:1 Continued to assess pt's right UE. Co banded removed. Pt with hard tender place when palpated above and below elbow fossa on ventral side. In supine, performed UE exercises against gravity included holding UE against gravity at shoulder at 90 degrees. In this position elbow flexion/extension, wrist flexion.extension, and grasp and release. Issued yellow and pink soft foam to continue to work on hand edema. Also performed light resistance of elbow flexion but reports only can tolerate a few reps (with peach theraband). Encouraged functional use throughout day.  Discussed concerns with Dr. Naaman Plummer today see his note about recommendations.   Therapy Documentation Precautions:  Precautions Precautions: Fall Precaution Comments: limited STM Restrictions Weight Bearing Restrictions: No   Therapy/Group: Individual Therapy  [facility]en [facility] 04/11/2019, 11:23 AM

## 2019-04-12 ENCOUNTER — Inpatient Hospital Stay (HOSPITAL_COMMUNITY): Payer: BLUE CROSS/BLUE SHIELD | Admitting: Speech Pathology

## 2019-04-12 ENCOUNTER — Inpatient Hospital Stay (HOSPITAL_COMMUNITY): Payer: BLUE CROSS/BLUE SHIELD | Admitting: Physical Therapy

## 2019-04-12 ENCOUNTER — Inpatient Hospital Stay (HOSPITAL_COMMUNITY): Payer: BLUE CROSS/BLUE SHIELD

## 2019-04-12 MED ORDER — PHENAZOPYRIDINE HCL 100 MG PO TABS
100.0000 mg | ORAL_TABLET | Freq: Every day | ORAL | Status: DC | PRN
  Administered 2019-04-13: 100 mg via ORAL
  Filled 2019-04-12 (×2): qty 1

## 2019-04-12 NOTE — Progress Notes (Signed)
Physical Therapy Session Note  Patient Details  Name: Brandon Thomas MRN: [id number] Date of Birth: 1975-09-10  Today's Date: 04/12/2019 PT Individual Time: 6979-4801 PT Individual Time Calculation (min): 55 min   Short Term Goals: Week 1:  PT Short Term Goal 1 (Week 1): =LTGs due to ELOS  Skilled Therapeutic Interventions/Progress Updates:  Pt was seen bedside in the pm. Pt S for all transfers and ambulation all over the unit. In gym pt performed alternating cone taps and criss cross cone taps, 3 sets x 10 reps each. Pt rone Nu-step 10 minutes x 2 at level 4. Pt returned to room and left sitting up in bedside chair with all needs within reach.    Therapy Documentation Precautions:  Precautions Precautions: Fall Precaution Comments: limited STM Restrictions Weight Bearing Restrictions: No General:   Pain: No c/o pain.   Therapy/Group: Individual Therapy  Dub Amis 04/12/2019, 2:59 PM

## 2019-04-12 NOTE — Progress Notes (Signed)
Speech Language Pathology Daily Session Note  Patient Details  Name: Brandon Thomas MRN: [id number] Date of Birth: 12-19-1975  Today's Date: 04/12/2019 SLP Individual Time: 0831-0929 SLP Individual Time Calculation (min): 84 min  Short Term Goals: Week 1: SLP Short Term Goal 1 (Week 1): STGs=LTGs due to ELOS  Skilled Therapeutic Interventions:  Skilled treatment session focused cognition goals specifically targeting memory deficits. SLP facilitated session by providing medication management task to help pt with recall of current medicines, function of each medicine and use of pill organizer to aid in memory of administration. Pt unable to recall information related to current medicines and SLP provided this information. Pt was able to appropriately fill out the organizer but required Max A cues to mark off medicines as he placed into organizer to aid in remembering which medicines he had completed. SLP also taught memory strategies including chunking, association and repetition. SLP facilitated practice of each strategy using pt's daughters' age. After 5 minutes pt able to independently recall her age and after 10 minutes pt required cues to repeat associations in order to recall her age. After repeating the associations, pt able to recall her name. Pt left speech room and was able to take himself back to his room.       Pain Pain Assessment Pain Scale: 0-10 Pain Score: 0-No pain  Therapy/Group: Individual Therapy  Marsheila Alejo 04/12/2019, 10:56 AM

## 2019-04-12 NOTE — Progress Notes (Signed)
Occupational Therapy Session Note  Patient Details  Name: BHARATH BERNSTEIN MRN: [id number] Date of Birth: November 12, 1975  Today's Date: 04/12/2019 OT Individual Time: 1100-1200 OT Individual Time Calculation (min): 60 min    Short Term Goals: Week 1:  OT Short Term Goal 1 (Week 1): STG=LTG d/t ELOS  Skilled Therapeutic Interventions/Progress Updates:    1:1. Pt received in bed agreeable to OT. Significant improvement in edema in RUE with coban wrap, however digits still swollen. Able to oppose all digits. OT records todays tasks on mirror using expo marker for pt to check off as he completes to improve organization of session. Pt able to use list to only complete tasks 1x during session and requires VC for forced use of RUE during ADLS. No physical A required. Pt completes functional mobility with MOD I. Pt completes dowel HEP in supine for edema mangment with 5# dowel rod to increase muscles pumping fluid out of arm. OT rewraps RUE with coban without wrapping fingers to allow pt to grasp utensil for self feeding at lunch. OT returns after lunch and includes fingers in edema wrapping. Exited session with pt seated in w/c, call light in reach and all need met  Therapy Documentation Precautions:  Precautions Precautions: Fall Precaution Comments: limited STM Restrictions Weight Bearing Restrictions: No General:   Vital Signs: Therapy Vitals BP: 138/86 Pain: Pain Assessment Pain Scale: 0-10 Pain Score: 0-No pain ADL:   Vision   Perception    Praxis   Exercises:   Other Treatments:     Therapy/Group: Individual Therapy  Tonny Branch 04/12/2019, 11:16 AM

## 2019-04-12 NOTE — Progress Notes (Signed)
PHYSICAL MEDICINE & REHABILITATION PROGRESS NOTE   Subjective/Complaints:   ROS: Patient denies , nausea, vomiting, diarrhea, cough, shortness of breath or chest pain,     Objective:   No results found. Recent Labs    04/10/19 0618  WBC 8.4  HGB 14.6  HCT 45.6  PLT 351   Recent Labs    04/10/19 0618  NA 142  K 3.4*  CL 105  CO2 26  GLUCOSE 93  BUN 12  CREATININE 1.08  CALCIUM 8.9    Intake/Output Summary (Last 24 hours) at 04/12/2019 1113 Last data filed at 04/11/2019 1921 Gross per 24 hour  Intake 700 ml  Output -  Net 700 ml     Physical Exam: Vital Signs Blood pressure 138/86, pulse 74, temperature 98.2 F (36.8 C), resp. rate 18, height 5' 10"  (1.778 m), weight 93.4 kg, SpO2 98 %. Constitutional: No distress . Vital signs reviewed. HEENT: EOMI, oral membranes moist Neck: supple Cardiovascular: RRR without murmur. No JVD    Respiratory: CTA Bilaterally without wheezes or rales. Normal effort    GI: BS +, non-tender, non-distended  Musculoskeletal:  Comments: RUE-1 to  2+ edema (biceps to forearm) Some induration around biceps area and antecubital fossa, only mild erthema,abrasions.   Neurological: He is alert.AOx2. Unable to state date. Motor 5/5 except for RUE 5/5 Del , biceps 4- grip, 3+ FPL, FDP dig 2 and 3 Mild tingling/sensory loss along median n distribution of right hand, perhaps radial sensory distribution too to a lesser extent.  Some processing delays, STM deficits.  Skin: He isnot diaphoretic. Psychiatric: tearful at times, pleasant though    Assessment/Plan: 1. Functional deficits secondary to toxic/anoxic encephalopathy which require 3+ hours per day of interdisciplinary therapy in a comprehensive inpatient rehab setting.  Physiatrist is providing close team supervision and 24 hour management of active medical problems listed below.  Physiatrist and rehab team continue to assess barriers to discharge/monitor patient  progress toward functional and medical goals  Care Tool:  Bathing    Body parts bathed by patient: Right arm, Left arm, Chest, Abdomen, Front perineal area, Buttocks, Right upper leg, Left upper leg, Right lower leg, Left lower leg, Face         Bathing assist Assist Level: Set up assist     Upper Body Dressing/Undressing Upper body dressing   What is the patient wearing?: Pull over shirt    Upper body assist Assist Level: Supervision/Verbal cueing    Lower Body Dressing/Undressing Lower body dressing      What is the patient wearing?: Pants     Lower body assist Assist for lower body dressing: Set up assist     Toileting Toileting    Toileting assist Assist for toileting: Supervision/Verbal cueing     Transfers Chair/bed transfer  Transfers assist     Chair/bed transfer assist level: Supervision/Verbal cueing     Locomotion Ambulation   Ambulation assist      Assist level: Supervision/Verbal cueing Assistive device: No Device Max distance: >1000'   Walk 10 feet activity   Assist     Assist level: Supervision/Verbal cueing Assistive device: No Device   Walk 50 feet activity   Assist    Assist level: Supervision/Verbal cueing Assistive device: No Device    Walk 150 feet activity   Assist    Assist level: Supervision/Verbal cueing Assistive device: No Device    Walk 10 feet on uneven surface  activity   Assist  Assist level: Supervision/Verbal cueing Assistive device: Other (comment)(no device)   Wheelchair     Assist Will patient use wheelchair at discharge?: No   Wheelchair activity did not occur: N/A         Wheelchair 50 feet with 2 turns activity    Assist    Wheelchair 50 feet with 2 turns activity did not occur: N/A       Wheelchair 150 feet activity     Assist  Wheelchair 150 feet activity did not occur: N/A       Blood pressure 138/86, pulse 74, temperature 98.2 F (36.8 C),  resp. rate 18, height 5' 10"  (1.778 m), weight 93.4 kg, SpO2 98 %.  Medical Problem List and Plan: 1.  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                        -ELOS 04/14/2019  -Continue CIR therapies today including PT and OT  2.  Antithrombotics: -DVT/anticoagulation:  Pharmaceutical: Heparin, dopplers pending             -antiplatelet therapy: low dose ASA 3. Pain Management: tylenol prn 4. Mood: LCSW to follow for evaluation and support.              -antipsychotic agents: N/A             -team to provide ego support.  5. Neuropsych: This patient is not fully capable of making decisions on his own behalf.             -Would benefit from neuropsych evaluation. will likely need to arrange as outpt 6. Skin/Wound Care: Monitor RUE daily for resolution of erythema 7. Fluids/Electrolytes/Nutrition: Monitor I/O--  8. MSSA PNA/Cellulitis RUE: treated with Vancomycin/rocephin x 7 days--transitioned to Cefazolin 11/30  -transitioned to Keflex which was planned for 2 more days including today---will continue longer, likely another week  -dopplers negative RUE  -can use moist heat for indurated area  -otherwise ACE, elevation, massage, ice  -likely mild median nerve compression at antecub fossa,forearm---he has normal pulses and strong grip----FPL and FDP dig 2&3 weakness likely proximal to  AIN involved, has median distribution tingling,  OP EMG may be helpful if strenght does not improve 9. HTN: Monitor BP tid--continue low dose amlodipine 10. Persistent hypokalemia:   12/3--level better, still lowm Mg++ ok  12/4 continue supplement daily  11. Shocked liver: LFTs slowly resolving--improved further on 12/3 12. Rhabdomyolysis: CK 9,495-->27,887-->4,316.  Continue to encourage fluid intake and monitor renal status with serial checks.      LOS: 3 days A FACE TO FACE EVALUATION WAS PERFORMED  Charlett Blake 04/12/2019, 11:13 AM

## 2019-04-12 NOTE — Plan of Care (Signed)
  Problem: Consults Goal: RH BRAIN INJURY PATIENT EDUCATION Description: Description: See Patient Education module for eduction specifics Outcome: Progressing   Problem: RH SKIN INTEGRITY Goal: RH STG SKIN FREE OF INFECTION/BREAKDOWN Description: Pt will remain free of falls with injury while in rehab with supervision assist  Outcome: Progressing Goal: RH STG ABLE TO PERFORM INCISION/WOUND CARE W/ASSISTANCE Description: STG Able To Perform Incision/Wound Care With Mod I Assistance. Outcome: Progressing   Problem: RH SAFETY Goal: RH STG ADHERE TO SAFETY PRECAUTIONS W/ASSISTANCE/DEVICE Description: STG Adhere to Safety Precautions With supervision Assistance/Device. Outcome: Progressing   Problem: RH COGNITION-NURSING Goal: RH STG USES MEMORY AIDS/STRATEGIES W/ASSIST TO PROBLEM SOLVE Description: STG Uses Memory Aids/Strategies With mod Assistance to Problem Solve. Outcome: Progressing   Problem: RH PAIN MANAGEMENT Goal: RH STG PAIN MANAGED AT OR BELOW PT'S PAIN GOAL Description: Pt will manage pain at 3 or less on a scale of 0-10.  Outcome: Progressing

## 2019-04-13 ENCOUNTER — Inpatient Hospital Stay (HOSPITAL_COMMUNITY): Payer: BLUE CROSS/BLUE SHIELD | Admitting: Speech Pathology

## 2019-04-13 ENCOUNTER — Inpatient Hospital Stay (HOSPITAL_COMMUNITY): Payer: BLUE CROSS/BLUE SHIELD | Admitting: Physical Therapy

## 2019-04-13 ENCOUNTER — Inpatient Hospital Stay (HOSPITAL_COMMUNITY): Payer: BLUE CROSS/BLUE SHIELD

## 2019-04-13 NOTE — Progress Notes (Signed)
Physical Therapy Discharge Summary  Patient Details  Name: Brandon Thomas MRN: [id number] Date of Birth: 1975-06-15  Today's Date: 04/13/2019 PT Individual Time: 0800-0853 PT Individual Time Calculation (min): 53 min   Pt received in w/c in dayroom, agreeable to therapy and denies pain. Pt ambulated around unit and hospital independently w/o AD. Discussed need for supervision for all community mobility 2/2 memory impairments. Pt required min cues to use environmental cues to navigate to/from cafeteria. Pt in agreement with this plan. Also discussed need for 24/7 supervision at home from wife only for cognition, pt also in agreement. Practiced car transfer, gait over curbs and uneven surfaces, and stair negotiation. All performed independently. Floor transfer also performed w/ supervision. Performed FGA as detailed below, improvement from 27/30 on eval day. Pt w/ ongoing c/o back and rib soreness from what he suspects is CPR. Provided w/ stretching HEP for soreness relief and pt demo-ed correctly, included knee-to-chest, trunk rotation, and pec stretch all in supine w/ 30 sec hold. Discussed w/ RN, NT, and charge RN regarding pt being independent in hospital room for gait. All in agreement. Ended session in supine, all needs met and in reach.   Patient has met 10 of 10 long term goals due to improved balance and ability to compensate for deficits.  Patient to discharge at an ambulatory level Independent for household mobility, supervision for community mobility. Pt continues to require cues to use external memory aids for way-finding in new environments.  Patient's care partner is independent to provide the necessary cognitive assistance at discharge.  Reasons goals not met: n/a  Recommendation:  No follow-up needed. Anticipate pt's independence in community and novel environments will improve w/ SLP follow-up.   Equipment: No equipment provided  Reasons for discharge: treatment goals met  and discharge from hospital  Patient/family agrees with progress made and goals achieved: Yes  PT Discharge Precautions/Restrictions Precautions Precautions: Fall Restrictions Weight Bearing Restrictions: No Vital Signs Therapy Vitals Temp: 98.3 F (36.8 C) Temp Source: Oral Pulse Rate: 74 Resp: 18 BP: 133/81 Patient Position (if appropriate): Lying Oxygen Therapy SpO2: 99 % O2 Device: Room Air Vision/Perception  Perception Perception: Within Functional Limits Praxis Praxis: Intact  Cognition Overall Cognitive Status: Impaired/Different from baseline Arousal/Alertness: Awake/alert Orientation Level: Oriented X4 Sustained Attention: Appears intact Memory: Impaired Memory Impairment: Retrieval deficit;Decreased short term memory;Decreased long term memory;Decreased recall of new information Awareness: Appears intact Problem Solving: Appears intact Safety/Judgment: Appears intact Sensation Sensation Light Touch: Appears Intact Coordination Gross Motor Movements are Fluid and Coordinated: Yes Motor  Motor Motor: Within Functional Limits  Mobility Bed Mobility Bed Mobility: Rolling Right;Rolling Left;Sit to Supine;Supine to Sit Rolling Right: Independent Rolling Left: Independent Supine to Sit: Independent Sit to Supine: Independent Transfers Transfers: Sit to Stand;Stand to [employer] Transfers Sit to Stand: Independent Stand to Sit: Independent Stand Pivot Transfers: Independent Transfer (Assistive device): None Locomotion  Gait Ambulation: Yes Gait Assistance: Independent Gait Distance (Feet): 150 Feet Assistive device: None Gait Assistance Details: supervision for community mobility 2/2 memory impairments and cues needed to use external memory aids and compensations Stairs / Additional Locomotion Stairs: Yes Stairs Assistance: Independent with assistive device Stair Management Technique: One rail Right;Alternating pattern Number of Stairs:  12 Height of Stairs: 8 Ramp: Independent Curb: Independent Wheelchair Mobility Wheelchair Mobility: No  Trunk/Postural Assessment  Cervical Assessment Cervical Assessment: Within Functional Limits Thoracic Assessment Thoracic Assessment: Within Functional Limits Lumbar Assessment Lumbar Assessment: Within Functional Limits Postural Control Postural Control: Within Functional Limits  Balance Balance Balance Assessed: Yes Static Sitting Balance Static Sitting - Level of Assistance: 7: Independent Dynamic Sitting Balance Dynamic Sitting - Level of Assistance: 7: Independent Static Standing Balance Static Standing - Level of Assistance: 7: Independent Dynamic Standing Balance Dynamic Standing - Level of Assistance: 7: Independent Functional Gait  Assessment Gait Level Surface: Walks 20 ft in less than 5.5 sec, no assistive devices, good speed, no evidence for imbalance, normal gait pattern, deviates no more than 6 in outside of the 12 in walkway width. Change in Gait Speed: Able to smoothly change walking speed without loss of balance or gait deviation. Deviate no more than 6 in outside of the 12 in walkway width. Gait with Horizontal Head Turns: Performs head turns smoothly with no change in gait. Deviates no more than 6 in outside 12 in walkway width Gait with Vertical Head Turns: Performs head turns with no change in gait. Deviates no more than 6 in outside 12 in walkway width. Gait and Pivot Turn: Pivot turns safely within 3 sec and stops quickly with no loss of balance. Step Over Obstacle: Is able to step over 2 stacked shoe boxes taped together (9 in total height) without changing gait speed. No evidence of imbalance. Gait with Narrow Base of Support: Is able to ambulate for 10 steps heel to toe with no staggering. Gait with Eyes Closed: Walks 20 ft, no assistive devices, good speed, no evidence of imbalance, normal gait pattern, deviates no more than 6 in outside 12 in walkway  width. Ambulates 20 ft in less than 7 sec. Ambulating Backwards: Walks 20 ft, no assistive devices, good speed, no evidence for imbalance, normal gait Steps: Alternating feet, no rail. Total Score: 30 Extremity Assessment  RLE Assessment RLE Assessment: Within Functional Limits LLE Assessment LLE Assessment: Within Functional Limits    Kyng Matlock K Amirah Goerke 04/13/2019, 8:56 AM

## 2019-04-13 NOTE — Progress Notes (Signed)
[facility] PHYSICAL MEDICINE & REHABILITATION PROGRESS NOTE   Subjective/Complaints:  Started pyridium for dysuria, patient states that he is not fearful of pain during urination now recent UA neg  Up in room mod I without falls or staff concerns Working with speech today  ROS: Patient denies , nausea, vomiting, diarrhea, cough, shortness of breath or chest pain,     Objective:   No results found. No results for input(s): WBC, HGB, HCT, PLT in the last 72 hours. No results for input(s): NA, K, CL, CO2, GLUCOSE, BUN, CREATININE, CALCIUM in the last 72 hours.  Intake/Output Summary (Last 24 hours) at 04/13/2019 1123 Last data filed at 04/12/2019 2100 Gross per 24 hour  Intake 480 ml  Output -  Net 480 ml     Physical Exam: Vital Signs Blood pressure 133/81, pulse 74, temperature 98.3 F (36.8 C), temperature source Oral, resp. rate 18, height 5' 10"  (1.778 m), weight 93.4 kg, SpO2 99 %. Constitutional: No distress . Vital signs reviewed. HEENT: EOMI, oral membranes moist Neck: supple Cardiovascular: RRR without murmur. No JVD    Respiratory: CTA Bilaterally without wheezes or rales. Normal effort    GI: BS +, non-tender, non-distended  Musculoskeletal:  Comments: RUE-1 to  2+ edema (biceps to forearm) Some induration around biceps area and antecubital fossa, only mild erthema,abrasions.   Neurological: He is alert.AOx2. Unable to state date. Motor 5/5 except for RUE 5/5 Del , biceps 4- grip, 3+ FPL, FDP dig 2 and 3 Mild tingling/sensory loss along median n distribution of right hand, perhaps radial sensory distribution too to a lesser extent.  Some processing delays, STM deficits.  Skin: He isnot diaphoretic. Psychiatric: tearful at times, pleasant though    Assessment/Plan: 1. Functional deficits secondary to toxic/anoxic encephalopathy which require 3+ hours per day of interdisciplinary therapy in a comprehensive inpatient rehab setting.  Physiatrist is  providing close team supervision and 24 hour management of active medical problems listed below.  Physiatrist and rehab team continue to assess barriers to discharge/monitor patient progress toward functional and medical goals  Care Tool:  Bathing    Body parts bathed by patient: Right arm, Left arm, Chest, Abdomen, Front perineal area, Buttocks, Right upper leg, Left upper leg, Right lower leg, Left lower leg, Face         Bathing assist Assist Level: Set up assist     Upper Body Dressing/Undressing Upper body dressing   What is the patient wearing?: Pull over shirt    Upper body assist Assist Level: Set up assist    Lower Body Dressing/Undressing Lower body dressing      What is the patient wearing?: Pants     Lower body assist Assist for lower body dressing: Set up assist     Toileting Toileting    Toileting assist Assist for toileting: Supervision/Verbal cueing     Transfers Chair/bed transfer  Transfers assist     Chair/bed transfer assist level: Independent     Locomotion Ambulation   Ambulation assist      Assist level: Independent Assistive device: No Device Max distance: >150'   Walk 10 feet activity   Assist     Assist level: Independent Assistive device: No Device   Walk 50 feet activity   Assist    Assist level: Independent Assistive device: No Device    Walk 150 feet activity   Assist    Assist level: Independent Assistive device: No Device    Walk 10 feet on uneven surface  activity   Assist     Assist level: Independent Assistive device: Other (comment)(no device)   Wheelchair     Assist Will patient use wheelchair at discharge?: No   Wheelchair activity did not occur: N/A         Wheelchair 50 feet with 2 turns activity    Assist    Wheelchair 50 feet with 2 turns activity did not occur: N/A       Wheelchair 150 feet activity     Assist  Wheelchair 150 feet activity did not  occur: N/A       Blood pressure 133/81, pulse 74, temperature 98.3 F (36.8 C), temperature source Oral, resp. rate 18, height 5' 10"  (1.778 m), weight 93.4 kg, SpO2 99 %.  Medical Problem List and Plan: 1.  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                        -ELOS 04/14/2019  -Continue CIR therapies today including PT and OT  2.  Antithrombotics: -DVT/anticoagulation:  Pharmaceutical: Heparin, dopplers pending             -antiplatelet therapy: low dose ASA 3. Pain Management: tylenol prn 4. Mood: LCSW to follow for evaluation and support.              -antipsychotic agents: N/A             -team to provide ego support.  5. Neuropsych: This patient is not fully capable of making decisions on his own behalf.             -Would benefit from neuropsych evaluation. will likely need to arrange as outpt 6. Skin/Wound Care: Monitor RUE daily for resolution of erythema 7. Fluids/Electrolytes/Nutrition: Monitor I/O--  8. MSSA PNA/Cellulitis RUE: treated with Vancomycin/rocephin x 7 days--transitioned to Cefazolin 11/30  -transitioned to Keflex which was planned for 2 more days including today---will continue longer, likely another week  -dopplers negative RUE  -can use moist heat for indurated area  -otherwise ACE, elevation, massage, ice  -likely mild median nerve compression at antecub fossa,forearm---he has normal pulses and strong grip----FPL and FDP dig 2&3 weakness likely proximal to  AIN involved, has median distribution tingling,  OP EMG may be helpful if strength does not improve 9. HTN: Monitor BP tid--continue low dose amlodipine 10. Persistent hypokalemia:   12/3--level better, still low Mg++ ok  12/4 continue supplement daily  11. Shocked liver: LFTs slowly resolving--improved further on 12/3 12. Rhabdomyolysis: CK 9,495-->27,887-->4,316.  Continue to encourage fluid intake and monitor renal status with serial checks.  13.   Dysuria improved on Pyridium, UA negative    LOS: 4 days A FACE TO FACE EVALUATION WAS PERFORMED  Brandon Thomas 04/13/2019, 11:23 AM

## 2019-04-13 NOTE — Progress Notes (Signed)
Occupational Therapy Discharge Summary  Patient Details  Name: Brandon Thomas MRN: [id number] Date of Birth: 05-23-1975  Today's Date: 04/13/2019 OT Individual Time: 1300-1430 OT Individual Time Calculation (min): 90 min    Patient has met 3 of 4 long term goals due to improved activity tolerance, ability to compensate for deficits, functional use of  RIGHT upper extremity, improved attention, improved awareness and improved coordination.  Patient to discharge at overall Modified Independent level.  Patient's care partner is independent to provide the necessary cognitive assistance at discharge.    Reasons goals not met: Pt is unable to carryover day-to-day information without min-mod cueing and external aids. Pt will continue to benefit from cognitive rehab to improve STM.   Recommendation:  Patient will benefit from ongoing skilled OT services in outpatient setting to continue to advance functional skills in the area of iADL and Vocation.  Equipment: No equipment provided  Reasons for discharge: treatment goals met and discharge from hospital  Patient/family agrees with progress made and goals achieved: Yes   Skilled OT Intervention: Pt received supine with no c/o pain. Pt agreeable to session. Pt used checklist to get ready for shower. Edema wrappings removed Pt required mod cueing for STM overall. Pt completed shower at mod I level overall. Pt appropriately washed himself and terminated shower without cueing. Pt donned all clothing with mod I. Pt completed 200+ ft of functional mobility throughout session with mod I, no LOB. Pt was guided through gentle AROM in the RUE, and then the arm and hand were re-wrapped with coband and gauze to promote fluid return. Pt completed working memory task, reading instructions and then walking across the room to answer question based on map posted on the opposite wall. Pt required min-mod cueing overall for memory throughout. Pt then completed  hospital navigation task with dual processing component. Pt able to find room with no cues from [employer]	[place]- good improvement. Pt was left in his room with all needs met.   OT Discharge Precautions/Restrictions  Precautions Precautions: None Precaution Comments: limited STM Restrictions Weight Bearing Restrictions: No   Pain Pain Assessment Pain Scale: 0-10 Pain Score: 0-No pain ADL ADL Eating: Independent Where Assessed-Eating: Chair Grooming: Independent Where Assessed-Grooming: Standing at sink Upper Body Bathing: Modified independent Where Assessed-Upper Body Bathing: Shower Lower Body Bathing: Modified independent Where Assessed-Lower Body Bathing: Shower Upper Body Dressing: Modified independent (Device) Where Assessed-Upper Body Dressing: Standing at sink Lower Body Dressing: Modified independent Where Assessed-Lower Body Dressing: Standing at sink Toileting: Modified independent Where Assessed-Toileting: Glass blower/designer: Diplomatic Services operational officer Method: Human resources officer: Modified independent Clinical cytogeneticist Method: Ambulating Vision Baseline Vision/History: No visual deficits Patient Visual Report: No change from baseline Vision Assessment?: No apparent visual deficits Perception  Perception: Within Functional Limits Praxis Praxis: Intact Cognition Overall Cognitive Status: Impaired/Different from baseline Arousal/Alertness: Awake/alert Orientation Level: Oriented X4 Attention: Sustained Sustained Attention: Appears intact Memory: Impaired Memory Impairment: Retrieval deficit;Decreased short term memory;Decreased long term memory;Decreased recall of new information Decreased Long Term Memory: Verbal basic;Functional basic Decreased Short Term Memory: Verbal basic;Functional basic Awareness: Impaired Awareness Impairment: Anticipatory impairment Problem Solving: Appears intact Safety/Judgment: Appears  intact Sensation Sensation Light Touch: Appears Intact Coordination Gross Motor Movements are Fluid and Coordinated: Yes Fine Motor Movements are Fluid and Coordinated: No Coordination and Movement Description: decreased coordination and AROM d/t edmatous RUE Motor  Motor Motor: Within Functional Limits Mobility  Bed Mobility Bed Mobility: Rolling Right;Rolling Left;Sit to Supine;Supine to Sit Rolling Right: Independent Rolling  Left: Independent Supine to Sit: Independent Sit to Supine: Independent Transfers Sit to Stand: Independent Stand to Sit: Independent  Trunk/Postural Assessment  Cervical Assessment Cervical Assessment: Within Functional Limits Thoracic Assessment Thoracic Assessment: Within Functional Limits Lumbar Assessment Lumbar Assessment: Within Functional Limits Postural Control Postural Control: Within Functional Limits  Balance Balance Balance Assessed: Yes Static Sitting Balance Static Sitting - Level of Assistance: 7: Independent Dynamic Sitting Balance Dynamic Sitting - Level of Assistance: 7: Independent Static Standing Balance Static Standing - Level of Assistance: 7: Independent Dynamic Standing Balance Dynamic Standing - Level of Assistance: 7: Independent Extremity/Trunk Assessment RUE Assessment RUE Assessment: Exceptions to [facility] General Strength Comments: edematous, impacting AROM RUE from elbow down to digits LUE Assessment LUE Assessment: Within Functional Limits   Curtis Sites 04/13/2019, 1:43 PM

## 2019-04-13 NOTE — Progress Notes (Signed)
Speech Language Pathology Daily Session Note  Patient Details  Name: Brandon Thomas MRN: [id number] Date of Birth: 11/07/1975  Today's Date: 04/13/2019 SLP Individual Time: 0102-7253 SLP Individual Time Calculation (min): 61 min  Short Term Goals: Week 1: SLP Short Term Goal 1 (Week 1): STGs=LTGs due to ELOS  Skilled Therapeutic Interventions: Pt was seen for skilled ST targeting memory goals. SLP facilitated session with overall Min A verbal cues for use of memory notebook in order for pt to recall personally relevant information and biographical facts about his family as well as events from PT earlier this morning. Pt did recall ST's name from yesterday's session with Supervision A question cues and use of association memory strategy. Pt ambulated throughout unit to locate familiar rooms with Supervision A. Although he did not recall his room number, he did locate the correct room and initiated looking at number on entrance for future reference. Pt recalled function of 1 out of 5 medications - verbal review of all provided. 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 Pt left sitting in chair in his room - pt was made Mod I and allowed t ambulate freely about his room, therefore no safety alarms were set. Continue per current plan of care.       Pain Pain Assessment Pain Scale: 0-10 Pain Score: 0-No pain  Therapy/Group: Individual Therapy  Arbutus Leas 04/13/2019, 12:04 PM

## 2019-04-14 ENCOUNTER — Inpatient Hospital Stay (HOSPITAL_COMMUNITY): Payer: BLUE CROSS/BLUE SHIELD | Admitting: Speech Pathology

## 2019-04-14 ENCOUNTER — Encounter (HOSPITAL_COMMUNITY): Payer: BLUE CROSS/BLUE SHIELD | Admitting: Psychology

## 2019-04-14 DIAGNOSIS — R600 Localized edema: Secondary | ICD-10-CM

## 2019-04-14 DIAGNOSIS — F1910 Other psychoactive substance abuse, uncomplicated: Secondary | ICD-10-CM

## 2019-04-14 LAB — CBC
HCT: 48.5 % (ref 39.0–52.0)
Hemoglobin: 15.7 g/dL (ref 13.0–17.0)
MCH: 28.8 pg (ref 26.0–34.0)
MCHC: 32.4 g/dL (ref 30.0–36.0)
MCV: 89 fL (ref 80.0–100.0)
Platelets: 484 10*3/uL — ABNORMAL HIGH (ref 150–400)
RBC: 5.45 MIL/uL (ref 4.22–5.81)
RDW: 13.8 % (ref 11.5–15.5)
WBC: 8.3 10*3/uL (ref 4.0–10.5)
nRBC: 0 % (ref 0.0–0.2)

## 2019-04-14 LAB — BASIC METABOLIC PANEL
Anion gap: 11 (ref 5–15)
BUN: 19 mg/dL (ref 6–20)
CO2: 25 mmol/L (ref 22–32)
Calcium: 9.4 mg/dL (ref 8.9–10.3)
Chloride: 104 mmol/L (ref 98–111)
Creatinine, Ser: 1.1 mg/dL (ref 0.61–1.24)
GFR calc Af Amer: >60 mL/min (ref >60)
GFR calc non Af Amer: >60 mL/min (ref >60)
Glucose, Bld: 94 mg/dL (ref 70–99)
Potassium: 4.4 mmol/L (ref 3.5–5.1)
Sodium: 140 mmol/L (ref 135–145)

## 2019-04-14 MED ORDER — PANTOPRAZOLE SODIUM 40 MG PO TBEC: 40.0000 mg | DELAYED_RELEASE_TABLET | Freq: Every day | ORAL | 0 refills | Status: DC

## 2019-04-14 MED ORDER — SERTRALINE HCL 100 MG PO TABS: ORAL_TABLET | ORAL | 0 refills | Status: DC

## 2019-04-14 MED ORDER — CEPHALEXIN 500 MG PO CAPS: 500.0000 mg | ORAL_CAPSULE | Freq: Three times a day (TID) | ORAL | 0 refills | Status: DC

## 2019-04-14 MED ORDER — AMLODIPINE BESYLATE 2.5 MG PO TABS: 2.5000 mg | ORAL_TABLET | Freq: Every day | ORAL | 0 refills | Status: DC

## 2019-04-14 MED ORDER — NICOTINE POLACRILEX 2 MG MT GUM: 2.0000 mg | CHEWING_GUM | OROMUCOSAL | 0 refills | Status: DC | PRN

## 2019-04-14 NOTE — Care Management (Signed)
[facility] Individual Statement of Services  Patient Name:  GRIFFEN FRAYNE  Date:  04/11/2019  Welcome to the [facility].  Our goal is to provide you with an individualized program based on your diagnosis and situation, designed to meet your specific needs.  With this comprehensive rehabilitation program, you will be expected to participate in at least 3 hours of rehabilitation therapies Monday-Friday, with modified therapy programming on the weekends.  Your rehabilitation program will include the following services:  Physical Therapy (PT), Occupational Therapy (OT), Speech Therapy (ST), 24 hour per day rehabilitation nursing, Neuropsychology, Case Management (Social Worker), Rehabilitation Medicine, Nutrition Services and Pharmacy Services  Weekly team conferences will be held on Tuesdays to discuss your progress.  Your Social Worker will talk with you frequently to get your input and to update you on team discussions.  Team conferences with you and your family in attendance may also be held.  Expected length of stay: 5 days   Overall anticipated outcome: supervision  Depending on your progress and recovery, your program may change. Your Social Worker will coordinate services and will keep you informed of any changes. Your Social Worker's name and contact numbers are listed  below.  The following services may also be recommended but are not provided by the [facility] will be made to provide these services after discharge if needed.  Arrangements include referral to agencies that provide these services.  Your insurance has been verified to be:  [employer] [place] Your primary doctor is:  [facility]  Pertinent information will be shared with your doctor and your insurance company.  Social  Worker:  [place], [place] or [phone]   Information discussed with and copy given to patient by: Lennart Pall, 04/11/2019, 12:36 PM

## 2019-04-14 NOTE — Progress Notes (Signed)
Social Work Discharge Note   The overall goal for the admission was met for:   Discharge location: Yes - home with wife  Length of Stay: Yes - 5 days  Discharge activity level: Yes - supervision  Home/community participation: Yes  Services provided included: MD, RD, PT, OT, SLP, RN, Pharmacy, Neuropsych and SW  Financial Services: Private Insurance: [employer]  Follow-up services arranged: Outpatient: ST via [facility]  Comments (or additional information):    Contact info:  Wife, [name], @ [phone]  Patient/Family verbalized understanding of follow-up arrangements: Yes  Individual responsible for coordination of the follow-up plan: pt/wife  Confirmed correct DME delivered:  NA    Janika Jedlicka

## 2019-04-14 NOTE — Discharge Instructions (Signed)
Inpatient Rehab Discharge Instructions  Brandon Thomas Discharge date and time:  04/14/19  Activities/Precautions/ Functional Status:  Activity: no lifting, driving, or strenuous exercise till cleared by MD Diet: regular diet Wound Care: keep wound clean and dry   Functional status:  ___ No restrictions     ___ Walk up steps independently ___ 24/7 supervision/assistance   ___ Walk up steps with assistance _X__ Intermittent supervision/assistance  _X__ Bathe/dress independently ___ Walk with walker     ___ Bathe/dress with assistance ___ Walk Independently    ___ Shower independently ___ Walk with assistance    ___ Shower with assistance _X_ No alcohol     ___ Return to work/school ________      COMMUNITY REFERRALS UPON DISCHARGE:    Outpatient:  ST                   Agency:  [facility]   Phone: [phone]                Appointment Date/Time: 12/10 @ 1:15 pm       Special Instructions: 1. Follow up with [facility] for further treatment. 2. Drink plenty of water to keep kidneys flushed. 3. Keep wrapping right arm to keep swelling down.    My questions have been answered and I understand these instructions. I will adhere to these goals and the provided educational materials after my discharge from the hospital.  Patient/Caregiver Signature _______________________________ Date __________  Clinician Signature _______________________________________ Date __________  Please bring this form and your medication list with you to all your follow-up doctor's appointments.

## 2019-04-14 NOTE — Consult Note (Signed)
Neuropsychological Consultation   Patient:   Brandon Thomas   DOB:   01/24/1976  MR Number:  [id number]  Location:  [facility] [id number] [place] [address] Dept: [facility]: [phone]           Date of Service:   04/14/2019  Start Time:   10 AM End Time:   11 AM  Provider/Observer:  Ilean Skill, Psy.D.       Clinical Neuropsychologist       Billing Code/Service: 96158/96159  Chief Complaint:    Brandon Thomas is a 43 year old male with a history of depression, opioid abuse and previous drug treatment/rehab, and hypogonadism.  The patient was admitted on 04/03/2019 after being found by his wife unresponsive and gurgling sounds and CPR was initiated.  He did receive Narcan without response and had decerebrate posturing and concerns of seizure.  Urine drug screen was positive for benzo diazepam but not positive for opiates.  However, specific opiate testing for fentanyl was not conducted and the patient has acknowledged that he was using fentanyl at the time of his overdose.  The patient was intubated for airway protection and needed IVF for rhabdo and antibiotics for sepsis felt due to aspiration pneumonia and/or possible right upper extremity cellulitis.  EEG revealed severe diffuse encephalopathy.  MRI brain done revealed restricted diffusion throughout the hippocampi likely due to acute injury from toxic exposure and/or hypoxic event.  The patient has continued to have significant short-term memory deficits and problem-solving but his perseverative speech and orientation have significantly improved during his comprehensive inpatient rehabilitation stay.  The patient continues to complain about his right arm being swollen and having strange sensory sensations with his right arm and hand.  04/14/2019:  With with continued swelling and sensations with right hand.  Improving  cognition but continued new learning deficits for details but is able to remember more global information.  Reason for Service:  The patient was referred for neuropsychological consultation due to coping and adjustment issues.  While there are significant cognitive deficits formal testing was not conducted as the patient is likely to be showing some potential changes on almost a daily basis going forward.  Below is the HPI for the current admission.  HPI:  Brandon Thomas is a 43 year old male with history of depression, opioid abuse (s/p drug rehab but reported to be having behaviors consistent with relapse), hypogonadism (self treating with IM steroids)  who was admitted on 04/03/19 after found unresponsive with gurgling sound and CPR initiated. He was received narcan withou response and had decerebrate posturing with concerns of status epilepticus. Work up revealed lactic acidosis with  CK 9495, WBC- 22.5, shocked liver and was febrile with T-101.5. UCS positive for benzo's. He was intuabated for airway protection ands started on IVF for rhabdomyolysis, antibiotics for sepsis felt to be due to aspiration PNA and/or RUE cellulitis. Neurology consulted for input and EEG done revealing severe diffuse encephalopathy.  MRI brain done revealing restricted diffusion throughout the hippocampi likely due to acute injury from toxic exposure.  BC/LP negative and Dr. Leonel Ramsay felt that work up pointed to fentanyl overdose- and has signed off.  RUE dopplers done due to ongoing edema and showed superficial right basilar vein thrombosis --low dose ASA added and local measures used. Patient continues to demonstrated STM deficits with deficits in problem solving and perserverative speech. CIR recommended due to functional deficits.  He is very concerned that his right arm is increasing in size. He feels numbness and tingling in his right hand, which is not new for him. Denies pain. He was more active this morning  with PT and has not been elevating his hand. His wife says he was given warm compresses for his hand in acute care. He did have a doppled last night that showed no change in his superficial thrombophlebitis.   Current Status:  The patient showed improved memory.  Was able to remember our visit last Thursday.  He remembered some details of conversation but now many specifics.  Both patient and wife report that he is improving and is going to be discharged today.  Asked questions about IOP for substance abuse issues.    Behavioral Observation: Brandon Thomas  presents as a 43 y.o.-year-old Right Caucasian Male who appeared his stated age. his dress was Appropriate and he was Well Groomed and his manners were Appropriate to the situation.  his participation was indicative of Appropriate and Redirectable behaviors.  There were any physical disabilities noted.  he displayed an appropriate level of cooperation and motivation.     Interactions:    Active Appropriate and Redirectable  Attention:   abnormal and attention span appeared shorter than expected for age, improving  Memory:   abnormal; remote memory intact, recent memory impaired, improving  Visuo-spatial:  not examined  Speech (Volume):  normal  Speech:   normal; normal  Thought Process:  Coherent and Relevant  Though Content:  WNL; not suicidal and not homicidal  Orientation:   person, place and situation  Judgment:   Fair  Planning:   Poor  Affect:    Appropriate  Mood:    Dysphoric and Irritable  Insight:   Fair  Intelligence:   normal  Substance Use:  There is a documented history of prescription drug and Opiate abuse confirmed by the patient.    Medical History:   Past Medical History:  Diagnosis Date  . Depression    Well treated without meds currently  . Elevated LFTs 08/20/2013  . Hyperlipidemia 08/20/2013    Psychiatric History:  The patient has a past psychiatric history including depression and  significant history of opiate abuse.  Initial use of opiates that led to abuse/dependence was a broken hand during a significantly challenging emotional/stressful period of his life.  Family Med/Psych History:  Family History  Adopted: Yes    Risk of Suicide/Violence: low patient denies any suicidal or homicidal ideation.  Impression/DX:  Brandon Thomas is a 43 year old male with a history of depression, opioid abuse and previous drug treatment/rehab, and hypogonadism.  The patient was admitted on 04/03/2019 after being found by his wife unresponsive and gurgling sounds and CPR was initiated.  He did receive Narcan without response and had decerebrate posturing and concerns of seizure.  Urine drug screen was positive for benzo diazepam but not positive for opiates.  However, specific opiate testing for fentanyl was not conducted and the patient has acknowledged that he was using fentanyl at the time of his overdose.  The patient was intubated for airway protection and needed IVF for rhabdo and antibiotics for sepsis felt due to aspiration pneumonia and/or possible right upper extremity cellulitis.  EEG revealed severe diffuse encephalopathy.  MRI brain done revealed restricted diffusion throughout the hippocampi likely due to acute injury from toxic exposure and/or hypoxic event.  The patient has continued to have significant short-term memory deficits and problem-solving but his perseverative  speech and orientation have significantly improved during his comprehensive inpatient rehabilitation stay.  The patient continues to complain about his right arm being swollen and having strange sensory sensations with his right arm and hand.  The patient showed improved memory.  Was able to remember our visit last Thursday.  He remembered some details of conversation but now many specifics.  Both patient and wife report that he is improving and is going to be discharged today.  Asked questions about IOP for  substance abuse issues.    Disposition/Plan:  We may need neuropsych eval is a couple months.  Too early to do significant testing as patient is improving day by day right now.  If continued memory deficits at 6 months will do formal testing.  Diagnosis:    Encephalopathy - Plan: Ambulatory referral to Physical Medicine Rehab  Cellulitis of right upper extremity         Electronically Signed   _______________________ Ilean Skill, Psy.D.

## 2019-04-14 NOTE — Progress Notes (Signed)
Speech Language Pathology Discharge Summary  Patient Details  Name: Brandon Thomas MRN: [id number] Date of Birth: August 19, 1975  Today's Date: 04/14/2019 SLP Individual Time: 0830-0915 SLP Individual Time Calculation (min): 45 min   Skilled Therapeutic Interventions:  Skilled treatment session focused on completion of patient and family education with the patient's wife. Both were educated on memory compensatory strategies and how to incorporate strategies at home in order to maximize overall recall and safety with functional and familiar tasks. Both verbalized understanding and all questions were answered. Handouts were also given to reinforce information. Patient left upright in bed with wife present. Continue with current plan of care.    Patient has met 3 of 3 long term goals.  Patient to discharge at [facility] level.   Reasons goals not met: N/A   Clinical Impression/Discharge Summary: Patient has made functional gains and has met 3 of 3 LTGs this admission. Currently, patient requires overall Min A verbal cues for utilization of memory compensatory strategies to recall daily and functional information. Patient also demonstrates improved awareness of memory deficits and their overall impact on his function upon discharge. Patient and family education is complete and patient will discharge home with 24 hour supervision from family. Patient would benefit from f/u SLP services to maximize his cognitive functioning and overall functional independence in order to reduce caregiver burden.   Care Partner:  Caregiver Able to Provide Assistance: Yes  Type of Caregiver Assistance: Cognitive  Recommendation:  Outpatient SLP;24 hour supervision/assistance  Rationale for SLP Follow Up: Maximize cognitive function and independence   Equipment: N/A   Reasons for discharge: Treatment goals met;Discharged from hospital   Patient/Family Agrees with Progress Made and Goals Achieved: Yes     [name], [name] 04/14/2019, 6:25 AM

## 2019-04-14 NOTE — Patient Care Conference (Signed)
Inpatient RehabilitationTeam Conference and Plan of Care Update Date: 04/14/2019   Time: 8:49 AM    Patient Name: Brandon Thomas      Medical Record Number: [id number]  Date of Birth: [birth year] Sex: Male         Room/Bed: 4W10C/4W10C-01 Payor Info: Payor: BLUE CROSS BLUE [employer]IELD / Plan: BCBS OTHER / Product Type: *No Product type* /    Admit Date/Time:  04/09/2019  1:54 PM  Primary Diagnosis:  Encephalopathy  Patient Active Problem List   Diagnosis Date Noted  . Moderate hypoxic-ischemic encephalopathy   . CVA (cerebral vascular accident) ([place]) 04/09/2019  . Cellulitis 04/09/2019  . Superficial venous thrombosis of arm, right 04/09/2019  . Edema of right upper arm 04/09/2019  . Substance abuse ([place]) 04/09/2019  . Encephalopathy 04/09/2019  . Anal fissure 04/15/2015  . Hypogonadism in male 04/13/2015  . Chest pain 04/13/2015  . Snoring 04/13/2015  . Fatigue 04/13/2015  . Dyslipidemia 04/13/2015  . Insomnia 07/17/2014  . Anxiety 09/08/2013  . Hyperlipidemia 08/20/2013  . Elevated LFTs 08/20/2013  . Exertional chest pain 08/20/2013    Expected Discharge Date: Expected Discharge Date: 04/14/19  Team Members Present: Physician leading conference: Dr. Alger Simons Social Worker Present: Lennart Pall, LCSW Nurse Present: Rayetta Pigg, RN PT Present: Burnard Bunting, PT OT Present: Mariane Masters, OT SLP Present: Weston Anna, SLP PPS Coordinator present : Gunnar Fusi, SLP     Current Status/Progress Goal Weekly Team Focus  Bowel/Bladder             Swallow/Nutrition/ Hydration             ADL's   Mod I ADLs and transfers. Mod cueing STM. 1-2 cm edema in RUE  Supervison-mod I  STM compensation, ADL retraining, edema management, family edu   Mobility   supervision overall, gait >1000' w/o AD, 27/30 on FGA  mod/i household mobility, supervision community mobility  higher level balance, cognitive remediation, functional working memory, Firefighter Observations  Min A for recall with use of memory compensatory strategies  Min A  Family Education, discharge home with 24 hour supervision and f/u SLP services   Pain             Skin              Rehab Goals Patient on target to meet rehab goals: Yes *See Care Plan and progress notes for long and short-term goals.     Barriers to Discharge  Current Status/Progress Possible Resolutions Date Resolved   Nursing                  PT                    OT                  SLP                SW                Discharge Planning/Teaching Needs:  Plan to d/c home with wife providing 24/7 supervision  ongoing to include neuropsychology   Team Discussion:  Pt has met goals and ready for d/c today.  Revisions to Treatment Plan:  NA    Medical Summary Current Status: anoxic encephalopathy d/t fentanyl OD, cellulitis RUE, ongoing memory deficits, working on drug rehab, after  care, dc planning this week Weekly Focus/Goal: see above  Barriers to Discharge: Behavior  Barriers to Discharge Comments: stm DEFICITS Possible Resolutions to Barriers: supervision at home   Continued Need for Acute Rehabilitation Level of Care: The patient requires daily medical management by a physician with specialized training in physical medicine and rehabilitation for the following reasons: Direction of a multidisciplinary physical rehabilitation program to maximize functional independence : Yes Medical management of patient stability for increased activity during participation in an intensive rehabilitation regime.: Yes Analysis of laboratory values and/or radiology reports with any subsequent need for medication adjustment and/or medical intervention. : Yes   I attest that I was present, lead the team conference, and concur with the assessment and plan of the team.   Cyla Haluska 04/15/2019, 8:49 AM

## 2019-04-14 NOTE — Progress Notes (Signed)
Social Work Assessment and Plan   Patient Details  Name: Brandon Thomas MRN: [id number] Date of Birth: 1975/11/14  Today's Date: 04/10/2019  Problem List:  Patient Active Problem List   Diagnosis Date Noted  . Moderate hypoxic-ischemic encephalopathy   . CVA (cerebral vascular accident) ([place]) 04/09/2019  . Cellulitis 04/09/2019  . Superficial venous thrombosis of arm, right 04/09/2019  . Edema of right upper arm 04/09/2019  . Substance abuse ([place]) 04/09/2019  . Encephalopathy 04/09/2019  . Anal fissure 04/15/2015  . Hypogonadism in male 04/13/2015  . Chest pain 04/13/2015  . Snoring 04/13/2015  . Fatigue 04/13/2015  . Dyslipidemia 04/13/2015  . Insomnia 07/17/2014  . Anxiety 09/08/2013  . Hyperlipidemia 08/20/2013  . Elevated LFTs 08/20/2013  . Exertional chest pain 08/20/2013   Past Medical History:  Past Medical History:  Diagnosis Date  . Depression    Well treated without meds currently  . Elevated LFTs 08/20/2013  . Hyperlipidemia 08/20/2013   Past Surgical History:  Past Surgical History:  Procedure Laterality Date  . WISDOM TOOTH EXTRACTION     Social History:  reports that he has never smoked. His smokeless tobacco use includes chew. He reports current alcohol use of about 1.0 standard drinks of alcohol per week. He reports that he does not use drugs.  Family / Support Systems Marital Status: Married Patient Roles: Spouse, Parent Spouse/Significant Other: wife, Kouper Spinella @ [phone] Children: two children ages 74 and 18 yrs ol Anticipated Caregiver: wife (danielle) Ability/Limitations of Caregiver: supervision Caregiver Availability: 24/7 Family Dynamics: Pt's wfie states she is prepared to provide 24/7 support, however, admits frustration with pt's situation overall.  Pt appears somewhat timid in his responses to wife and very apologetic.  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   Abuse/Neglect Abuse/Neglect Assessment Can Be Completed: Yes Physical Abuse: Denies Verbal Abuse: Denies Sexual Abuse: Denies Exploitation of patient/patient's resources: Denies Self-Neglect: Denies  Emotional Status Pt's affect, behavior and adjustment status: Pt sitting up in bed and participates fully in assessment interview, however, does repeat answers at times.  He becomes tearful when he talks of feeling "ashamed" with his relapse and states, "I'm surprised she's (wife) still sitting here."  There does appear some tension between pt and wife throughout interview.  Pt talks about feeling "...like I'm rainman now...".  Neuropsychology has already seen pt and will do so again prior to his d/c. Recent Psychosocial Issues: In drub abuse residential program over the past summer.  In current job only 3 months. Psychiatric History: pt admits h/o depression - no counseling but medication management. Substance Abuse History: As noted, pt completed a 30 day inpatient treatment for drub abuse [employer]) over this past summer.  He completed IOP program at [facility] after his discharge.  Sporadically attending virtuatl NA meetings.  Patient / Family Perceptions, Expectations & Goals Pt/Family understanding of illness & functional limitations: Pt and wife with general understanding of his overdose and resulting encephalopathy.  General understanding of his current functional limitations/ need for short CIR stay. Premorbid pt/family roles/activities: Pt had recently completed drug rehabilitation and was in a new job.  Completely independent. Anticipated changes in  roles/activities/participation: Pt's goals set for  supervision.  Wife to be primary caregiver. Pt/family expectations/goals: Pt very focused on wanting to return to his job ASAP.  Wife more realistic about his cognitive deficits and that return to work could take several months.  Community Resources [employer]: Other (Comment)([facility]) Premorbid Home Care/DME Agencies: None Transportation available at discharge: yes Resource referrals recommended: Neuropsychology, Support group (specify)  Discharge Planning Living Arrangements: Spouse/significant other, Children Support Systems: Spouse/significant other, Friends/neighbors Type of Residence: Private residence Insurance Resources: Multimedia programmer (specify)([place]BS of [place]) Financial Resources: Employment Financial Screen Referred: No Living Expenses: Medical laboratory scientific officer Management: Patient Does the patient have any problems obtaining your medications?: No Home Management: Pt and wife Patient/Family Preliminary Plans: Pt to return home with wife providing 24/7 supervision. Social Work Anticipated Follow Up Needs: HH/OP Expected length of stay: 5-8 days  Clinical Impression Unfortunate gentleman here following drug overdose and now with cognitive impairment due to encephelopathy. Wife at bedside and with many questions about planning for their anticipated financial struggles due to the situation.  Many questions about expected recovery for pt.  Pt very remorseful as he had completed a inpatient drug rehab program over this past summer.  Neuropsychology to follow quickly and closely.  Will assist with support and d/c planning needs.  Anticipate very short LOS.  Sherol Sabas 04/10/2019, 4:43 PM

## 2019-04-14 NOTE — Progress Notes (Signed)
Brandon Thomas PHYSICAL MEDICINE & REHABILITATION PROGRESS NOTE   Subjective/Complaints:  Still some burning in the urine. Anxious to go home. Wife in the room had questions about follow up, disability, etc  ROS: Patient denies fever, rash, sore throat, blurred vision, nausea, vomiting, diarrhea, cough, shortness of breath or chest pain, joint or back pain, headache, or mood change.      Objective:   No results found. No results for input(s): WBC, HGB, HCT, PLT in the last 72 hours. No results for input(s): NA, K, CL, CO2, GLUCOSE, BUN, CREATININE, CALCIUM in the last 72 hours.  Intake/Output Summary (Last 24 hours) at 04/14/2019 0927 Last data filed at 04/14/2019 4010 Gross per 24 hour  Intake 1020 ml  Output -  Net 1020 ml     Physical Exam: Vital Signs Blood pressure 120/75, pulse 91, temperature 98.3 F (36.8 C), temperature source Oral, resp. rate 18, height 5' 10"  (1.778 m), weight 93 kg, SpO2 97 %. Constitutional: No distress . Vital signs reviewed. HEENT: EOMI, oral membranes moist Neck: supple Cardiovascular: RRR without murmur. No JVD    Respiratory: CTA Bilaterally without wheezes or rales. Normal effort    GI: BS +, non-tender, non-distended Musculoskeletal:  Comments: RUE-1+ edema (biceps to forearm) induration better. Less tender. ACE in place Neurological: He is alert.AOx2. Unable to state date. Motor 5/5 except for RUE 5/5 Del , biceps 4- grip, 3+ FPL, FDP dig 2 and 3 Mild tingling/sensory loss along median n distribution of right hand, perhaps radial sensory distribution too to a lesser extent.  Continued STM deficits. Good attention  Skin: He isnot diaphoretic. Psychiatric: anxious    Assessment/Plan: 1. Functional deficits secondary to toxic/anoxic encephalopathy which require 3+ hours per day of interdisciplinary therapy in a comprehensive inpatient rehab setting.  Physiatrist is providing close team supervision and 24 hour management of active  medical problems listed below.  Physiatrist and rehab team continue to assess barriers to discharge/monitor patient progress toward functional and medical goals  Care Tool:  Bathing    Body parts bathed by patient: Right arm, Left arm, Chest, Abdomen, Front perineal area, Buttocks, Right upper leg, Left upper leg, Right lower leg, Left lower leg, Face         Bathing assist Assist Level: Independent     Upper Body Dressing/Undressing Upper body dressing   What is the patient wearing?: Pull over shirt    Upper body assist Assist Level: Independent    Lower Body Dressing/Undressing Lower body dressing      What is the patient wearing?: Pants     Lower body assist Assist for lower body dressing: Independent     Toileting Toileting    Toileting assist Assist for toileting: Independent     Transfers Chair/bed transfer  Transfers assist     Chair/bed transfer assist level: Independent     Locomotion Ambulation   Ambulation assist      Assist level: Independent Assistive device: No Device Max distance: >150'   Walk 10 feet activity   Assist     Assist level: Independent Assistive device: No Device   Walk 50 feet activity   Assist    Assist level: Independent Assistive device: No Device    Walk 150 feet activity   Assist    Assist level: Independent Assistive device: No Device    Walk 10 feet on uneven surface  activity   Assist     Assist level: Independent Assistive device: Other (comment)(no device)   Wheelchair  Assist Will patient use wheelchair at discharge?: No   Wheelchair activity did not occur: N/A         Wheelchair 50 feet with 2 turns activity    Assist    Wheelchair 50 feet with 2 turns activity did not occur: N/A       Wheelchair 150 feet activity     Assist  Wheelchair 150 feet activity did not occur: N/A       Blood pressure 120/75, pulse 91, temperature 98.3 F (36.8 C),  temperature source Oral, resp. rate 18, height 5' 10"  (1.778 m), weight 93 kg, SpO2 97 %.  Medical Problem List and Plan: 1.  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                        -DC home today  -Patient to see Dr. Ranell Patrick in the office for transitional care encounter in 1-2 weeks.   -HH f/u  -[facility] seen [facility]. Recommended follow up re: substance abuse IOP 2.  Antithrombotics: -DVT/anticoagulation:  Pharmaceutical: Heparin, dopplers pending             -antiplatelet therapy: low dose ASA 3. Pain Management: tylenol prn 4. Mood: LCSW to follow for evaluation and support.              -antipsychotic agents: N/A             -team to provide ego support.  -resume zoloft per home dose  5. Neuropsych: This patient is not fully capable of making decisions on his own behalf.             -Would benefit from neuropsych evaluation. will likely need to arrange as outpt 6. Skin/Wound Care: Monitor RUE daily for resolution of erythema 7. Fluids/Electrolytes/Nutrition: Monitor I/O--  8. MSSA PNA/Cellulitis RUE: treated with Vancomycin/rocephin x 7 days--transitioned to Cefazolin 11/30  -continue keflex thru Friday  -dopplers negative RUE  -can use moist heat for indurated area  -otherwise ACE, elevation, massage, ice  -likely mild median nerve compression at antecub fossa,forearm---he has normal pulses and strong grip----consider OP EMG may be helpful if strength does not improve 9. HTN: Monitor BP tid--continue low dose amlodipine 10. Persistent hypokalemia:   12/3--level better, still low Mg++ ok  12/4 continue supplement daily  11. Shocked liver: LFTs slowly resolving--improved further on 12/3 12. Rhabdomyolysis: CK 9,495-->27,887-->4,316.  Continue to encourage fluid intake and monitor renal status with serial checks.  13.  Dysuria improved on Pyridium, UA negative  -will not discharge on pyridium  -will check ucx before he  goes  -advised him to contact us if dysuria continues    LOS: 5 days A FACE TO FACE EVALUATION WAS PERFORMED  Meredith Staggers 04/14/2019, 9:27 AM

## 2019-04-15 LAB — URINE CULTURE: Culture: NO GROWTH

## 2019-04-17 ENCOUNTER — Other Ambulatory Visit: Payer: Self-pay

## 2019-04-17 ENCOUNTER — Ambulatory Visit: Payer: BLUE CROSS/BLUE SHIELD | Attending: Physical Medicine and Rehabilitation | Admitting: Speech Pathology

## 2019-04-17 ENCOUNTER — Encounter: Payer: Self-pay | Admitting: Speech Pathology

## 2019-04-17 DIAGNOSIS — R41841 Cognitive communication deficit: Secondary | ICD-10-CM | POA: Diagnosis not present

## 2019-04-17 NOTE — Therapy (Signed)
[facility] [address] [place], [place], [address] Phone: [phone]   Fax:  [phone]  Speech Language Pathology Evaluation  Patient Details  Name: Brandon Thomas MRN: [id number] Date of Birth: [birth year] Referring Provider (SLP): Reesa Chew, [place]   Encounter Date: 04/17/2019  End of Session - 04/17/19 1532    Visit Number  1    Number of Visits  17    Date for SLP Re-Evaluation  06/16/19    Authorization Type  BCBS, 20 visit limit    Authorization - Visit Number  0    Authorization - Number of Visits  20    SLP Start Time  3007    SLP Stop Time   1404    SLP Time Calculation (min)  49 min    Activity Tolerance  Patient tolerated treatment well       Past Medical History:  Diagnosis Date  . Depression    Well treated without meds currently  . Elevated LFTs 08/20/2013  . Hyperlipidemia 08/20/2013    Past Surgical History:  Procedure Laterality Date  . WISDOM TOOTH EXTRACTION      There were no vitals filed for this visit.  Subjective Assessment - 04/17/19 1321    Subjective  "My memory is banged up pretty bad."    Patient is accompained by:  Family member   wife [name]   Currently in Pain?  No/denies         SLP Evaluation [facility] - 04/17/19 1321      SLP Visit Information   SLP Received On  04/17/19    Referring Provider (SLP)  Reesa Chew, PA-C    Onset Date  04/02/19    Medical Diagnosis  encephalopathy s/p overdose      Subjective   Patient/Family Stated Goal  "I'd like to get back to work as soon as possible."      General Information   HPI  Patient is a 43 y.o. male with history noted for depression, polysubstance abuse who presented to [facility]/02[facility]19 after found unresponsive by wife who initiated CPR. Pt had recently completed drug rehab. Patient treated with narcan without response and had decerebrate posturing with concerns of prolonged seizure with CK 9495, WBC- 22.5,  shocked liver, was febrile with T-101.5, had lactic acidosis and UDS positive for benzo's. MRI 11/28 showed "restricted diffusion diffusely throughout the hippocampi likely reflecting acute injury from toxic exposure." Admitted to [place] 04/09/19-04/14/19, d/c with 24-7 supervision.    Behavioral/Cognition  alert, cooperative    Mobility Status  ambulated to session   no f/u PT recommended     Balance Screen   Has the patient fallen in the past 6 months  Yes    How many times?  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     Cognition   Overall Cognitive Status  Impaired/Different from baseline    Area of Impairment  Attention;Memory;Safety/judgement;Awareness;Problem solving    Current Attention Level  Sustained;Selective    Attention Comments  reports easily distracted   attention scores WNL; functional  deficits reported   Memory  Decreased short-term memory;Decreased recall of precautions    Memory Comments  forgets conversations, daily events   forgot he took out the trash 5 minutes later, children's age   Safety/Judgement  Decreased awareness of deficits    Awareness  Emergent;Anticipatory   pt eager to return to work, wife managing schedule currently   Awareness Comments  able to state memory, focus as deficit areas    Problem Solving  Slow processing   extended time to calculate age (could not remember)   Manufacturing engineer   planning impaired   Organizing  Impaired    Organizing Impairment  --   clock drawing impaired   Behaviors  Impulsive      Auditory Comprehension   Overall Auditory Comprehension  Appears within functional limits for tasks  assessed   requested repetition of instructions x1 (attn)     Visual Recognition/Discrimination   Discrimination  Within Function Limits      Reading Comprehension   Reading Status  Not tested   suspect attention would impact reading comprehension     Expression   Primary Mode of Expression  Verbal      Verbal Expression   Overall Verbal Expression  Appears within functional limits for tasks assessed      Written Expression   Dominant Hand  Right   R hand numbness; pursuing ST only at this time d/t ins limit   Written Expression  Not tested      Oral Motor/Sensory Function   Overall Oral Motor/Sensory Function  Other (comment)   deferred due to covid-19, masking     Motor Speech   Overall Motor Speech  Appears within functional limits for tasks assessed      Standardized Assessments   Standardized Assessments   Cognitive Linguistic Quick Test      Cognitive Linguistic Quick Test (Ages 18-69)   Attention  WNL   196/215; functional deficits   Memory  Moderate   133/185   Executive Function  WNL   31/40; functional deficits   Language  WNL   31/37   Visuospatial Skills  Mild   63/105   Severity Rating Total  17    Composite Severity Rating  14.6                      SLP Education - 04/17/19 1531    Education Details  proposed therapy goals/plan of care, memory strategies; wife should assist pt but allow him to write schedule vs managing for him    Person(s) Educated  Patient;Spouse    Methods  Explanation;Handout    Comprehension  Verbalized understanding;Need further instruction       SLP Short Term Goals - 04/17/19 1546      SLP SHORT TERM GOAL #1   Title  Patient will use compensatory aids (memory notebook, phone) to plan and recall daily schedule, chores, appointments or activities with min A x3 visits.    Time  4    Period  Weeks    Status  New      SLP SHORT TERM GOAL #2   Title  pt will demo ability to switch between functional tasks  naturally with modified independence (compensations, timers, alerts) x 3 sessions    Time  4    Period  Weeks    Status  New      SLP SHORT TERM GOAL #3   Title  pt will demonstrate ability  to recall information at least 10 minutes later in ST sessions with modified independence (compensations) 95%    Time  4    Period  Weeks    Status  New       SLP Long Term Goals - 04/17/19 1605      SLP LONG TERM GOAL #1   Title  pt will utilize memory system appropriately during 6 ST sessions    Time  8    Period  Weeks    Status  New      SLP LONG TERM GOAL #2   Title  pt will demo ability multitask (e.g., work at computer and answer simple question) functional tasks naturally with modified indpendence x 3 sessions    Time  8    Period  Weeks    Status  New       Plan - 04/17/19 1542    Clinical Impression Statement  Mr. Ekstein presents with overall mild-moderate cognitive communication impairment due to encephalopathy s/p overdose; deficits noted today include attention, memory, executive function (planning/organization), and awareness. Patient's wife assisting with schedule management; has been writing daily schedule and checklists for pt on the mirror and in the shower. Most significant deficit is memory; pt has difficulty with short term recall of conversations and activities, as well as of personal details (his own age, his children's ages). 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 Pt/wife unsure if he will qualify for disability (benefits for new job weren't to begin until January) and are eager for him to return to work. SLP educated today re: proposed plan of care and that pt will likely need to participate both in sessions and at home over a period of weeks/months. I recommend skilled ST to address cognitive deficits in order to improve pt's safety, increase independence, and for regaining  skills/training compensations for possible return to work.    Speech Therapy Frequency  2x / week    Duration  --   8 weeks or 17 visits   Treatment/Interventions  Environmental controls;Cueing hierarchy;SLP instruction and feedback;Cognitive reorganization;Compensatory techniques;Functional tasks;Compensatory strategies;Internal/external aids;Patient/family education    Potential to Achieve Goals  Good    Consulted and Agree with Plan of Care  Patient;Family member/caregiver       Patient will benefit from skilled therapeutic intervention in order to improve the following deficits and impairments:   Cognitive communication deficit    Problem List Patient Active Problem List   Diagnosis Date Noted  . Moderate hypoxic-ischemic encephalopathy   . CVA (cerebral vascular accident) ([place]) 04/09/2019  . Cellulitis 04/09/2019  . Superficial venous thrombosis of arm, right 04/09/2019  . Edema of right upper arm 04/09/2019  . Substance abuse ([place]) 04/09/2019  . Encephalopathy 04/09/2019  . Anal fissure 04/15/2015  . Hypogonadism in male 04/13/2015  . Chest pain 04/13/2015  . Snoring 04/13/2015  . Fatigue 04/13/2015  . Dyslipidemia 04/13/2015  . Insomnia 07/17/2014  . Anxiety 09/08/2013  . Hyperlipidemia 08/20/2013  . Elevated LFTs 08/20/2013  . Exertional chest pain 08/20/2013   Deneise Lever, [place], [place] 04/17/2019, 4:08 PM  [facility] [address] [place], [place], [address] Phone: [phone]   Fax:  [phone]  Name: Brandon Thomas MRN: [id number] Date of Birth: 1975-09-26

## 2019-04-17 NOTE — Patient Instructions (Signed)
Start using your memory book from rehab. Brandon Thomas should be in charge of writing the date and the schedule for the day (plan with Brandon Thomas). Write down the tasks you're going to and check them off. Write down details about your day (who did you talk to on the phone, where did you go, etc). Bring this with you to therapy  Memory Compensation Strategies  1. Use "WARM" strategy. W= write it down A=  associate it R=  repeat it M=  make a mental picture  2. You can keep a Social worker. Use a 3-ring notebook with sections for the following:  calendar, important names and phone numbers, medications, doctors' names/phone numbers, "to do list"/reminders, and a section to journal what you did each day  3. Use a calendar to write appointments down.  4. Write yourself a schedule for the day.  This can be placed on the calendar or in a separate section of the Memory Notebook.  Keeping a regular schedule can help memory.  5. Use medication organizer with sections for each day or morning/evening pills  You may need help loading it  6. Keep a basket, or pegboard by the door.   Place items that you need to take out with you in the basket or on the pegboard.  You may also want to include a message board for reminders.  7. Use sticky notes. Place sticky notes with reminders in a place where the task is performed.  For example:  "turn off the stove" placed by the stove, "lock the door" placed on the door at eye level, "take your medications" on the bathroom mirror or by the place where you normally take your medications  8. Use alarms/timers.  Use while cooking to remind yourself to check on food or as a reminder to take your medicine, or as a reminder to make a call, or as a reminder to perform another task, etc.  9. Use a small tape recorder to record important information and notes for yourself.

## 2019-04-18 ENCOUNTER — Ambulatory Visit (INDEPENDENT_AMBULATORY_CARE_PROVIDER_SITE_OTHER): Payer: BLUE CROSS/BLUE SHIELD | Admitting: Osteopathic Medicine

## 2019-04-18 ENCOUNTER — Encounter: Payer: Self-pay | Admitting: Osteopathic Medicine

## 2019-04-18 VITALS — BP 122/78 | HR 109 | Temp 98.6°F | Wt 203.0 lb

## 2019-04-18 DIAGNOSIS — I639 Cerebral infarction, unspecified: Secondary | ICD-10-CM | POA: Diagnosis not present

## 2019-04-18 DIAGNOSIS — Z8739 Personal history of other diseases of the musculoskeletal system and connective tissue: Secondary | ICD-10-CM | POA: Diagnosis not present

## 2019-04-18 DIAGNOSIS — F1121 Opioid dependence, in remission: Secondary | ICD-10-CM | POA: Diagnosis not present

## 2019-04-18 DIAGNOSIS — M79A12 Nontraumatic compartment syndrome of left upper extremity: Secondary | ICD-10-CM | POA: Diagnosis not present

## 2019-04-18 NOTE — Patient Instructions (Addendum)
Plan:  Okay to come off of the pantoprazole acid reflux medication and the amlodipine blood pressure medication.  Would continue the aspirin and the sertraline, I have sent refills for that.  Let's get blood work/annual physical in another 6 months.  Please come back for nurse visit in 2 weeks or so to recheck blood pressure off of the amlodipine.  As long as blood pressure is under 130/80, nothing else to do.  If it is higher, we might consider putting you on a different blood pressure medicine.  Would recommend follow-up with sports medicine, Dr. Darene Lamer, if arm/chest do not seem to be improving as expected.  I would give it a couple weeks, use compression consistently.

## 2019-04-18 NOTE — Progress Notes (Signed)
HPI: Brandon Thomas is a 43 y.o. male who  has a past medical history of Depression, Elevated LFTs (08/20/2013), and Hyperlipidemia (08/20/2013).  he presents to [facility] today, 04/18/19,  for chief complaint of: Hospital follow-up Reestablish care   Discharge summary reviewed from admission in rehab 04/08/2021 discharge 04/14/2019, admission to hospital 04/02/19 discharge 04/09/19.  Patient was hospitalized after suffering an episode of unconsciousness of uncertain duration as a result of drug overdose.  Time down resulted in of course encephalopathy, rhabdomyolysis/compartment syndrome, seizure like activity likely due to overdose.  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  Patient reports doing well since his hospitalization other than some persistent arm and musculoskeletal chest pain from compartment syndrome, short-term memory issues since his stroke.  He is accompanied by his wife, [name].  They get along well, she is his medical and financial power of attorney ever since his stroke due to his memory issues.  They have an appointment upcoming with a lawyer to discuss living will.     Past medical, surgical, social and family history reviewed:  Patient Active Problem List   Diagnosis Date Noted  . Moderate hypoxic-ischemic encephalopathy   . CVA (cerebral vascular accident) ([place]) 04/09/2019  . Cellulitis 04/09/2019  . Superficial venous thrombosis of arm, right 04/09/2019  . Edema of right upper arm 04/09/2019  . Substance abuse ([place]) 04/09/2019  . Encephalopathy 04/09/2019  . Anal fissure 04/15/2015  . Hypogonadism in male 04/13/2015  . Chest pain 04/13/2015  . Snoring 04/13/2015  . Fatigue 04/13/2015  . Dyslipidemia 04/13/2015  . Insomnia 07/17/2014  . Anxiety 09/08/2013  . Hyperlipidemia 08/20/2013  . Elevated LFTs 08/20/2013  . Exertional chest pain 08/20/2013    Past Surgical History:  Procedure Laterality  Date  . WISDOM TOOTH EXTRACTION      Social History   Tobacco Use  . Smoking status: Never Smoker  . Smokeless tobacco: Current User    Types: Chew  Substance Use Topics  . Alcohol use: Yes    Alcohol/week: 1.0 standard drinks    Types: 1 Standard drinks or equivalent per week    Family History  Adopted: Yes     Current medication list and allergy/intolerance information reviewed:    Current Outpatient Medications  Medication Sig Dispense Refill  . acetaminophen (TYLENOL) 325 MG tablet Take 1-2 tablets (325-650 mg total) by mouth every 4 (four) hours as needed for mild pain.    Marland Kitchen amLODipine (NORVASC) 2.5 MG tablet Take 1 tablet (2.5 mg total) by mouth daily. 30 tablet 0  . aspirin EC 81 MG EC tablet Take 1 tablet (81 mg total) by mouth daily.    . cephALEXin (KEFLEX) 500 MG capsule Take 1 capsule (500 mg total) by mouth every 8 (eight) hours. 20 capsule 0  . nicotine polacrilex (NICORETTE) 2 MG gum Take 1 each (2 mg total) by mouth as needed for smoking cessation. 100 tablet 0  . pantoprazole (PROTONIX) 40 MG tablet Take 1 tablet (40 mg total) by mouth daily. 30 tablet 0  . polyethylene glycol (MIRALAX / GLYCOLAX) 17 g packet Take 17 g by mouth daily as needed for mild constipation. 14 each 0  . sertraline (ZOLOFT) 100 MG tablet Take 1/2 a pill daily for a week than increase to one pill daily 30 tablet 0   No current facility-administered medications for this visit.    Allergies  Allergen Reactions  . Atorvastatin Other (See Comments)  RUQ pain  . Crestor [Rosuvastatin] Other (See Comments)    Elevated LFTs      Review of Systems:  Constitutional:  No  fever, no chills, No recent illness, No unintentional weight changes. No significant fatigue.   HEENT: No  headache, no vision change, no hearing change, No sore throat, No  sinus pressure  Cardiac: No  chest pain, No  pressure, No palpitations, No  Orthopnea  Respiratory:  No  shortness of breath. No   Cough  Gastrointestinal: No  abdominal pain, No  nausea, No  vomiting,  No  blood in stool, No  diarrhea, No  constipation   Musculoskeletal: +myalgia/arthralgia  Skin: No  Rash, No other wounds/concerning lesions  Genitourinary: No  incontinence, No  abnormal genital bleeding, No abnormal genital discharge  Hem/Onc: No  easy bruising/bleeding, No  abnormal lymph node  Endocrine: No cold intolerance,  No heat intolerance.  Neurologic: No  weakness, No  dizziness, No  slurred speech/focal weakness/facial droop  Psychiatric: No  concerns with depression, No  concerns with anxiety, No sleep problems, No mood problems  Exam:  BP 122/78 (BP Location: Left Arm, Patient Position: Sitting, Cuff Size: Normal)   Pulse (!) 109   Temp 98.6 F (37 C) (Oral)   Wt 203 lb (92.1 kg)   BMI 29.13 kg/m   Constitutional: VS see above. General Appearance: alert, well-developed, well-nourished, NAD  Neck: No masses, trachea midline. No thyroid enlargement. No tenderness/mass appreciated. No lymphadenopathy  Respiratory: Normal respiratory effort. no wheeze, no rhonchi, no rales  Cardiovascular: S1/S2 normal, no murmur, no rub/gallop auscultated. RRR. No lower extremity edema.   Musculoskeletal: Gait normal.  Neurological: Normal balance/coordination. No tremor.  Skin: warm, dry, intact. No rash/ulcer. Marland Kitchen    Psychiatric: Normal judgment/insight. Normal mood and affect. Oriented x3.     ASSESSMENT/PLAN: The primary encounter diagnosis was Cerebrovascular accident (CVA), unspecified mechanism ([place]). 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   No orders of the defined types were placed in this encounter.   No orders of the defined types were placed in this encounter.   Patient Instructions  Plan:  Okay to come off of the pantoprazole acid reflux medication  and the amlodipine blood pressure medication.  Would continue the aspirin and the sertraline, I have sent refills for that.  Let's get blood work/annual physical in another 6 months.  Please come back for nurse visit in 2 weeks or so to recheck blood pressure off of the amlodipine.  As long as blood pressure is under 130/80, nothing else to do.  If it is higher, we might consider putting you on a different blood pressure medicine.  Would recommend follow-up with sports medicine, Dr. Darene Lamer, if arm/chest do not seem to be improving as expected.  I would give it a couple weeks, use compression consistently.         Visit summary with medication list and pertinent instructions was printed for patient to review. All questions at time of visit were answered - patient instructed to contact office with any additional concerns or updates. ER/RTC precautions were reviewed with the patient.   Note: Total time spent 45 minutes, greater than 50% of the visit was spent face-to-face counseling and coordinating care for the above diagnoses listed in assessment/plan.   Please note: voice recognition software was used to produce this document, and typos may escape review. Please contact  Dr. Sheppard Coil for any needed clarifications.     Follow-up plan: Return for Nurse visit 2 weeks, blood pressure check.  Annual with Dr. Loni Muse 6 months.

## 2019-04-23 ENCOUNTER — Other Ambulatory Visit: Payer: Self-pay

## 2019-04-23 ENCOUNTER — Ambulatory Visit: Payer: BLUE CROSS/BLUE SHIELD | Admitting: Speech Pathology

## 2019-04-23 ENCOUNTER — Encounter: Payer: Self-pay | Admitting: Speech Pathology

## 2019-04-23 DIAGNOSIS — R41841 Cognitive communication deficit: Secondary | ICD-10-CM

## 2019-04-23 DIAGNOSIS — S5411XA Injury of median nerve at forearm level, right arm, initial encounter: Secondary | ICD-10-CM | POA: Insufficient documentation

## 2019-04-23 NOTE — Therapy (Signed)
[facility] [address] [place], [place], [address] Phone: [phone]   Fax:  [phone]  Speech Language Pathology Treatment  Patient Details  Name: Brandon Thomas MRN: [id number] Date of Birth: 10/12/1975 Referring Provider (SLP): Reesa Chew, [place]   Encounter Date: 04/23/2019  End of Session - 04/23/19 0954    Visit Number  2    Number of Visits  17    Date for SLP Re-Evaluation  06/16/19    Authorization Type  [employer], 20 visit limit    Authorization - Visit Number  1    Authorization - Number of Visits  20    SLP Start Time  [id number]    SLP Stop Time   0930    SLP Time Calculation (min)  43 min    Activity Tolerance  Patient tolerated treatment well       Past Medical History:  Diagnosis Date  . Depression    Well treated without meds currently  . Elevated LFTs 08/20/2013  . Hyperlipidemia 08/20/2013    Past Surgical History:  Procedure Laterality Date  . WISDOM TOOTH EXTRACTION      There were no vitals filed for this visit.  Subjective Assessment - 04/23/19 0901    Subjective  "I'm using the to do list but she has to remind me"    Patient is accompained by:  Family member   wife, Brandon Thomas   Currently in Pain?  No/denies            ADULT SLP TREATMENT - 04/23/19 0902      General Information   Behavior/Cognition  Alert;Cooperative;Pleasant mood      Treatment Provided   Treatment provided  Cognitive-Linquistic      Pain Assessment   Pain Assessment  No/denies pain      Cognitive-Linquistic Treatment   Treatment focused on  Cognition    Skilled Treatment  Brandon Thomas is using a check list for basic ADL's, however he is not crossing them off and requiring cues to go back and check his to do list. The list is written in exppo on the bathroom mirror and includes shower, deoderant, brush teeth etc. as well as chores. Spouse and Brandon Thomas agree that Brandon Thomas does not require these basic steps  anymore. We discussed using a binder, with dividers and keeping his journal, calendar and to do list in 1 binder.  He is writing in journal major events of the day, but is not writing information he is repeating or repeatedly asking questions. We discussed using the journal for this type of infomration also. [place] bought him a calendar but it starts in January. I provided a calendar page for Brandon Thomas. Brandon Thomas added his upcoming appointments. He is confused about when he got home from the hospital, stating he thinks it was just a few days ago. We added this to his calendar as well. Christ is to refer to his notebook thoroughout the day, reviewed the date, his to do list, and is to cross off when he has completed a task. Brandon Thomas is aware that she will have to remind him throughout the day until use of the notebook becomes routine. Brandon Thomas is not wanting to use his phone or devices at this time as they cause stress about work. Brandon Thomas is is agreement with this, as am I.       Assessment / Recommendations / Plan   Plan  Continue with current plan of care      Progression Toward  Goals   Progression toward goals  Progressing toward goals       SLP Education - 04/23/19 0946    Education Details  keep calendar, to do and journal all in 1 binder; schedule neuropsych eval, use bookmark to keep on the line while reading;       SLP Short Term Goals - 04/23/19 0954      SLP SHORT TERM GOAL #1   Title  Patient will use compensatory aids (memory notebook, phone) to plan and recall daily schedule, chores, appointments or activities with min A x3 visits.    Time  4    Period  Weeks    Status  On-going      SLP SHORT TERM GOAL #2   Title  pt will demo ability to switch between functional tasks naturally with modified independence (compensations, timers, alerts) x 3 sessions    Time  4    Period  Weeks    Status  On-going      SLP SHORT TERM GOAL #3   Title  pt will demonstrate ability to recall  information at least 10 minutes later in ST sessions with modified independence (compensations) 95%    Time  4    Period  Weeks    Status  On-going       SLP Long Term Goals - 04/23/19 0954      SLP LONG TERM GOAL #1   Title  pt will utilize memory system appropriately during 6 ST sessions    Time  8    Period  Weeks    Status  On-going      SLP LONG TERM GOAL #2   Title  pt will demo ability multitask (e.g., work at computer and answer simple question) functional tasks naturally with modified indpendence x 3 sessions    Time  8    Period  Weeks    Status  On-going       Plan - 04/23/19 0953    Clinical Impression Statement  Mr. Urbanek presents with overall mild-moderate cognitive communication impairment due to encephalopathy s/p overdose; deficits noted today include attention, memory, executive function (planning/organization), and awareness. Patient's wife assisting with schedule management; has been writing daily schedule and checklists for pt on the mirror and in the shower. Most significant deficit is memory; pt has difficulty with short term recall of conversations and activities, as well as of personal details (his own age, his children's ages). 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 Pt/wife unsure if he will qualify for disability (benefits for new job weren't to begin until January) and are eager for him to return to work. SLP educated today re: proposed plan of care and that pt will likely need to participate both in sessions and at home over a period of weeks/months. I recommend skilled ST to address cognitive deficits in order to improve pt's safety, increase independence, and for regaining skills/training compensations for possible return to work.    Speech Therapy Frequency  2x / week    Duration  --   8 weeks or 17 visits   Treatment/Interventions  Environmental  controls;Cueing hierarchy;SLP instruction and feedback;Cognitive reorganization;Compensatory techniques;Functional tasks;Compensatory strategies;Internal/external aids;Patient/family education    Potential to Achieve Goals  Good       Patient will benefit from skilled therapeutic intervention in order to improve the following deficits and impairments:   Cognitive communication deficit  Problem List Patient Active Problem List   Diagnosis Date Noted  . Moderate hypoxic-ischemic encephalopathy   . CVA (cerebral vascular accident) ([place]) 04/09/2019  . Cellulitis 04/09/2019  . Superficial venous thrombosis of arm, right 04/09/2019  . Edema of right upper arm 04/09/2019  . Substance abuse ([place]) 04/09/2019  . Encephalopathy 04/09/2019  . Anal fissure 04/15/2015  . Hypogonadism in male 04/13/2015  . Chest pain 04/13/2015  . Snoring 04/13/2015  . Fatigue 04/13/2015  . Dyslipidemia 04/13/2015  . Insomnia 07/17/2014  . Anxiety 09/08/2013  . Hyperlipidemia 08/20/2013  . Elevated LFTs 08/20/2013  . Exertional chest pain 08/20/2013    Gerrell Thomas, Annye Rusk MS, CCC-SLP 04/23/2019, 9:55 AM  [facility] [address] [place], [place], [address] Phone: [phone]   Fax:  [phone]   Name: Brandon Thomas MRN: [id number] Date of Birth: 06-03-1975

## 2019-04-23 NOTE — Patient Instructions (Addendum)
  [email or web address] for a reading focus card or use index card or bookmark when reading  This week, focus on checking your to do list and crossing off when a chore is done  Cross off days on the calendar and review the month  In your journal, write down if you are repeating yourself or repeating questions, such as "Did I tell you?", Write down, "I told [name]...."   Get on neuropsych schedule as soon as you can  Bring notebook to [place] your notebook, calendar and to do list frequently  Memory game, [place] the to do list to the notebook  When [employer] a question or thought, ask him to look in his notebook

## 2019-04-28 ENCOUNTER — Encounter: Payer: Self-pay | Admitting: Registered Nurse

## 2019-04-28 ENCOUNTER — Other Ambulatory Visit: Payer: Self-pay

## 2019-04-28 ENCOUNTER — Encounter: Payer: BLUE CROSS/BLUE SHIELD | Attending: Registered Nurse | Admitting: Registered Nurse

## 2019-04-28 VITALS — BP 112/80 | HR 103 | Temp 98.1°F | Ht 73.0 in | Wt 202.0 lb

## 2019-04-28 DIAGNOSIS — F1910 Other psychoactive substance abuse, uncomplicated: Secondary | ICD-10-CM | POA: Diagnosis present

## 2019-04-28 DIAGNOSIS — G9340 Encephalopathy, unspecified: Secondary | ICD-10-CM | POA: Insufficient documentation

## 2019-04-28 NOTE — Progress Notes (Signed)
Subjective:    Patient ID: Brandon Thomas, male    DOB: January 09, 1976, 43 y.o.   MRN: [id number]  HPI: Brandon Thomas is a 43 y.o. male  Who is here for transitional care visit for follow of of his encephalopathy, moderate hypoxic-ischemic encephalopathy. He was found unresponsive by his wife on 04/02/2019, EMS transported him to [facility]. He has a history polysubstance abuse.   CT Head WO Contrast:  IMPRESSION: 1. No acute intracranial abnormality. 2. Mild right frontal and supraorbital scalp swelling without subjacent calvarial fracture. 3. Motion artifact degrades of cervical spine imaging quality. May limit detection of subtle, nondisplaced fractures. No definite acute cervical spine fracture or traumatic listhesis. 4. Minimal cervical spondylitic changes, as above.  CT Cervical Spine:  IMPRESSION: 1. No acute intracranial abnormality. 2. Mild right frontal and supraorbital scalp swelling without subjacent calvarial fracture. 3. Motion artifact degrades of cervical spine imaging quality. May limit detection of subtle, nondisplaced fractures. No definite acute cervical spine fracture or traumatic listhesis. 4. Minimal cervical spondylitic changes, as above.  CT Chest:  IMPRESSION: 1. Extensive areas of consolidation and volume loss posteriorly within the lungs with some air bronchograms, findings may reflect marked hypoventilatory changes in the setting of overdose though some underlying infection and/or sequela of aspiration may be present as well particularly given the airways thickening and secretions below the endotracheal balloon. 2. Endotracheal tube terminates 3.2 cm from the carina. 3. Transesophageal tube tip terminates within the gastric lumen with the side port distal to the GE junction.  MR Brain WO Contrast IMPRESSION: 1. Severely motion degraded, incomplete examination. 2. Restricted diffusion diffusely throughout the hippocampi  likely reflecting acute injury from toxic exposure given clinical history.  Neurology was consulted.  He was admitted to Inpatient Rehabilitation on 04/09/2019 and discharged home on 04/14/2019. He's receiving speech therapy at [facility].   He states he has numbness in his  Right hand,  And mid- back pain. He rates his pain 7. His current exercise regime walking and performing stretching exercises.   Wife in Room all questions answered.   Pain Inventory Average Pain 7 Pain Right Now 7 My pain is constant and sharp  In the last 24 hours, has pain interfered with the following? General activity 3 Relation with others 3 Enjoyment of life 5 What TIME of day is your pain at its worst? all Sleep (in general) Good  Pain is worse with: sitting Pain improves with: na Relief from Meds: 0  Mobility ability to climb steps?  yes do you drive?  no  Function I need assistance with the following:  household duties Do you have any goals in this area?  yes  Neuro/Psych numbness depression anxiety  Prior Studies Any changes since last visit?  no  Physicians involved in your care Any changes since last visit?  no   Family History  Adopted: Yes   Social History   Socioeconomic History  . Marital status: Married    Spouse name: Not on file  . Number of children: 2  . Years of education: Not on file  . Highest education level: Not on file  Occupational History  . Occupation: Press photographer  Tobacco Use  . Smoking status: Never Smoker  . Smokeless tobacco: Current User    Types: Chew  Substance and Sexual Activity  . Alcohol use: Yes    Alcohol/week: 1.0 standard drinks    Types: 1 Standard drinks or equivalent per week  . Drug  use: No  . Sexual activity: Yes    Partners: Female  Other Topics Concern  . Not on file  Social History Narrative   Two children ages 64 and 70.  Happily married x 9 years.     Social Determinants of Health   Financial Resource  Strain:   . Difficulty of Paying Living Expenses: Not on file  Food Insecurity:   . Worried About Charity fundraiser in the Last Year: Not on file  . Ran Out of Food in the Last Year: Not on file  Transportation Needs:   . Lack of Transportation (Medical): Not on file  . Lack of Transportation (Non-Medical): Not on file  Physical Activity:   . Days of Exercise per Week: Not on file  . Minutes of Exercise per Session: Not on file  Stress:   . Feeling of Stress : Not on file  Social Connections:   . Frequency of Communication with Friends and Family: Not on file  . Frequency of Social Gatherings with Friends and Family: Not on file  . Attends Religious Services: Not on file  . Active Member of Clubs or Organizations: Not on file  . Attends Archivist Meetings: Not on file  . Marital Status: Not on file   Past Surgical History:  Procedure Laterality Date  . WISDOM TOOTH EXTRACTION     Past Medical History:  Diagnosis Date  . Depression    Well treated without meds currently  . Elevated LFTs 08/20/2013  . Hyperlipidemia 08/20/2013   There were no vitals taken for this visit.  Opioid Risk Score:   Fall Risk Score:  `1  Depression screen PHQ 2/9  No flowsheet data found.   Review of Systems  Constitutional: Negative.   HENT: Negative.   Eyes: Negative.   Respiratory: Negative.   Cardiovascular: Negative.   Gastrointestinal: Negative.   Endocrine: Negative.   Genitourinary: Negative.   Musculoskeletal: Negative.   Skin: Negative.   Allergic/Immunologic: Negative.   Neurological: Positive for numbness.  Hematological: Negative.   Psychiatric/Behavioral: Positive for decreased concentration. The patient is nervous/anxious.   All other systems reviewed and are negative.      Objective:   Physical Exam Vitals and nursing note reviewed.  Constitutional:      Appearance: Normal appearance.  Cardiovascular:     Rate and Rhythm: Normal rate and regular  rhythm.     Pulses: Normal pulses.     Heart sounds: Normal heart sounds.  Pulmonary:     Effort: Pulmonary effort is normal.     Breath sounds: Normal breath sounds.  Musculoskeletal:     Cervical back: Normal range of motion and neck supple.     Comments: Normal Muscle Bulk and Muscle Testing Reveals:  Upper Extremities: Full ROM and Muscle Strength on the Right 4/5 and Left 5/5 Lower Extremities: Full ROM and Muscle Strength 5/5 Arises from Table with Ease Narrow Based Gait   Skin:    General: Skin is warm and dry.  Neurological:     Mental Status: He is alert and oriented to person, place, and time.  Psychiatric:        Mood and Affect: Mood normal.        Behavior: Behavior normal.           Assessment & Plan:  1. Encephalopathy: Moderate Hypoxic- Ischemic Encephalopathy: Continue to Monitor.  2. Polysubstance Abuse: Mr. Blaylock reports he has been drug-free since discharge. We will Continue to Monitor.  Referral Placed for Dr. Sima Matas.   20 minutes of face to face patient care time was spent during this visit. All questions were encouraged and answered.  F/U with Dr. Naaman Plummer in 4- 6 weeks

## 2019-04-30 ENCOUNTER — Other Ambulatory Visit: Payer: Self-pay

## 2019-04-30 ENCOUNTER — Ambulatory Visit: Payer: BLUE CROSS/BLUE SHIELD

## 2019-04-30 DIAGNOSIS — R41841 Cognitive communication deficit: Secondary | ICD-10-CM

## 2019-04-30 NOTE — Therapy (Signed)
[facility] [address] [place], [place], [address] Phone: [phone]   Fax:  [phone]  Speech Language Pathology Treatment  Patient Details  Name: Brandon Thomas MRN: [id number] Date of Birth: 11/13/1975 Referring Provider (SLP): Reesa Chew, [place]   Encounter Date: 04/30/2019  End of Session - 04/30/19 1620    Visit Number  3    Number of Visits  17    Date for SLP Re-Evaluation  06/16/19    Authorization Type  [employer], 20 visit limit    Authorization - Visit Number  2    Authorization - Number of Visits  20    SLP Start Time  8270    SLP Stop Time   7867    SLP Time Calculation (min)  43 min    Activity Tolerance  Patient tolerated treatment well       Past Medical History:  Diagnosis Date  . Depression    Well treated without meds currently  . Elevated LFTs 08/20/2013  . Hyperlipidemia 08/20/2013    Past Surgical History:  Procedure Laterality Date  . WISDOM TOOTH EXTRACTION      There were no vitals filed for this visit.  Subjective Assessment - 04/30/19 1540    Subjective  Pt recalled what he did today    Currently in Pain?  Yes    Pain Score  5     Pain Location  Back    Pain Orientation  Medial;Upper    Pain Descriptors / Indicators  Constant    Pain Type  Chronic pain            ADULT SLP TREATMENT - 04/30/19 1543      General Information   Behavior/Cognition  Alert;Cooperative;Pleasant mood      Treatment Provided   Treatment provided  Cognitive-Linquistic      Cognitive-Linquistic Treatment   Treatment focused on  Cognition    Skilled Treatment  "I feel like the fog is sort of lifting." [name] agrees. Pt recalled much of what his day consisted of, told SLP the date correctly. Pt/wife played the memory game with. Pt stated he needed to set 3-4 reminders prior to any meeting for work. Pt told SLP 3 things earlier in the session - SLP cued pt he had already stated these  things in order to raise pt's awareness. Pt stated he needs to be better about using the calendar and journal. SLP told pt he was going to ask about specifics about the holiday and his books he is reading next session (05-05-19).       Assessment / Recommendations / Plan   Plan  Continue with current plan of care      Progression Toward Goals   Progression toward goals  Progressing toward goals         SLP Short Term Goals - 04/30/19 1632      SLP SHORT TERM GOAL #1   Title  Patient will use compensatory aids (memory notebook, phone) to plan and recall daily schedule, chores, appointments or activities with min A x3 visits.    Time  3    Period  Weeks    Status  On-going      SLP SHORT TERM GOAL #2   Title  pt will demo ability to switch between functional tasks naturally with modified independence (compensations, timers, alerts) x 3 sessions    Time  3    Period  Weeks    Status  On-going  SLP SHORT TERM GOAL #3   Title  pt will demonstrate ability to recall information at least 10 minutes later in ST sessions with modified independence (compensations) 95%    Time  3    Period  Weeks    Status  On-going       SLP Long Term Goals - 04/30/19 1632      SLP LONG TERM GOAL #1   Title  pt will utilize memory system appropriately during 6 ST sessions    Time  7    Period  Weeks    Status  On-going      SLP LONG TERM GOAL #2   Title  pt will demo ability multitask (e.g., work at computer and answer simple question) functional tasks naturally with modified indpendence x 3 sessions    Time  7    Period  Weeks    Status  On-going       Plan - 04/30/19 1621    Clinical Impression Statement  Mr. 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 Wife agrees.  I recommend cont'd skilled ST to address cognitive deficits in order to improve pt's safety, increase independence, and for regaining skills/training compensations for possible return to work.    Speech Therapy Frequency  2x / week    Duration  --   8 weeks or 17 visits   Treatment/Interventions  Environmental controls;Cueing hierarchy;SLP instruction and feedback;Cognitive reorganization;Compensatory techniques;Functional tasks;Compensatory strategies;Internal/external aids;Patient/family education    Potential to Achieve Goals  Good       Patient will benefit from skilled therapeutic intervention in order to improve the following deficits and impairments:   Cognitive communication deficit    Problem List Patient Active Problem List   Diagnosis Date Noted  . Moderate hypoxic-ischemic encephalopathy   . CVA (cerebral vascular accident) ([place]) 04/09/2019  . Cellulitis 04/09/2019  . Superficial venous thrombosis of arm, right 04/09/2019  . Edema of right upper arm 04/09/2019  . Substance abuse ([place]) 04/09/2019  . Encephalopathy 04/09/2019  . Anal fissure 04/15/2015  . Hypogonadism in male 04/13/2015  . Chest pain 04/13/2015  . Snoring 04/13/2015  . Fatigue 04/13/2015  . Dyslipidemia 04/13/2015  . Insomnia 07/17/2014  . Anxiety 09/08/2013  . Hyperlipidemia 08/20/2013  . Elevated LFTs 08/20/2013  . Exertional chest pain 08/20/2013    [facility] ,[place], CCC-SLP  04/30/2019, 4:33 PM  [facility] [address] [place], [place], [address] Phone: [phone]   Fax:  [phone]   Name: Brandon Thomas MRN: [id number] Date of Birth: 1976/01/20

## 2019-05-05 ENCOUNTER — Other Ambulatory Visit: Payer: Self-pay

## 2019-05-05 ENCOUNTER — Ambulatory Visit: Payer: BLUE CROSS/BLUE SHIELD

## 2019-05-05 DIAGNOSIS — R41841 Cognitive communication deficit: Secondary | ICD-10-CM

## 2019-05-05 NOTE — Therapy (Signed)
[facility] [address] [place], [place], [address] Phone: [phone]   Fax:  [phone]  Speech Language Pathology Treatment  Patient Details  Name: Brandon Thomas MRN: [id number] Date of Birth: 02-05-1976 Referring Provider (SLP): Reesa Chew, [place]   Encounter Date: 05/05/2019  End of Session - 05/05/19 1531    Visit Number  4    Number of Visits  17    Date for SLP Re-Evaluation  06/16/19    Authorization Type  [employer], 20 visit limit    Authorization - Visit Number  3    Authorization - Number of Visits  20    SLP Start Time  9373    SLP Stop Time   4287    SLP Time Calculation (min)  41 min    Activity Tolerance  Patient tolerated treatment well       Past Medical History:  Diagnosis Date  . Depression    Well treated without meds currently  . Elevated LFTs 08/20/2013  . Hyperlipidemia 08/20/2013    Past Surgical History:  Procedure Laterality Date  . WISDOM TOOTH EXTRACTION      There were no vitals filed for this visit.  Subjective Assessment - 05/05/19 1401    Subjective  "It feels like I'm on vacation and I forget what day we did what"    Patient is accompained by:  Family member   Brandon Thomas           ADULT SLP TREATMENT - 05/05/19 1411      General Information   Behavior/Cognition  Alert;Cooperative;Pleasant mood      Treatment Provided   Treatment provided  Cognitive-Linquistic      Pain Assessment   Pain Assessment  No/denies pain      Cognitive-Linquistic Treatment   Treatment focused on  Cognition    Skilled Treatment  Pt gives examples of calendar questions he asks Thomas repeatedly. SLP suggested pt keep calendar on a wall or the frig. Pt told SLP he routinely used calendar app (Outlook) on his phone - pt suggested he cut off the cell service as he does not want to receive work calls/textx, and Thomas suggested to have pt use her old phone which SLP confirmed would  be best as this was his previous mode of appointment keeping. SLP encouraged this and pt to bring this next visit with ST appointments in the calendar. Thomas gives example of pt asking about dogs and prices of dogs - pt suggested he could put this info into the phone.       Assessment / Recommendations / Plan   Plan  Continue with current plan of care      Progression Toward Goals   Progression toward goals  Progressing toward goals         SLP Short Term Goals - 05/05/19 1626      SLP SHORT TERM GOAL #1   Title  Patient will use compensatory aids (memory notebook, phone) to plan and recall daily schedule, chores, appointments or activities with min A x3 visits.    Time  2    Period  Weeks    Status  On-going      SLP SHORT TERM GOAL #2   Title  pt will demo ability to switch between functional tasks naturally with modified independence (compensations, timers, alerts) x 3 sessions    Time  2    Period  Weeks    Status  On-going  SLP SHORT TERM GOAL #3   Title  pt will demonstrate ability to recall information at least 10 minutes later in ST sessions with modified independence (compensations) 95%    Time  2    Period  Weeks    Status  On-going       SLP Long Term Goals - 05/05/19 1626      SLP LONG TERM GOAL #1   Title  pt will utilize memory system appropriately during 6 ST sessions    Time  6    Period  Weeks    Status  On-going      SLP LONG TERM GOAL #2   Title  pt will demo ability multitask (e.g., work at computer and answer simple question) functional tasks naturally with modified indpendence x 3 sessions    Time  6    Period  Weeks    Status  On-going       Plan - 05/05/19 1625    Clinical Impression Statement  Brandon Thomas presents with 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 Pt cont having difficulty  with short term recall of conversations and activities, as well as of personal details (his own age, his children's ages) but states today he feels a little more coherent this week than previously. Thomas agrees.  I recommend cont'd skilled ST to address cognitive deficits in order to improve pt's safety, increase independence, and for regaining skills/training compensations for possible return to work.    Speech Therapy Frequency  2x / week    Duration  --   8 weeks or 17 visits   Treatment/Interventions  Environmental controls;Cueing hierarchy;SLP instruction and feedback;Cognitive reorganization;Compensatory techniques;Functional tasks;Compensatory strategies;Internal/external aids;Patient/family education    Potential to Achieve Goals  Good       Patient will benefit from skilled therapeutic intervention in order to improve the following deficits and impairments:   Cognitive communication deficit    Problem List Patient Active Problem List   Diagnosis Date Noted  . Moderate hypoxic-ischemic encephalopathy   . CVA (cerebral vascular accident) ([place]) 04/09/2019  . Cellulitis 04/09/2019  . Superficial venous thrombosis of arm, right 04/09/2019  . Edema of right upper arm 04/09/2019  . Substance abuse ([place]) 04/09/2019  . Encephalopathy 04/09/2019  . Anal fissure 04/15/2015  . Hypogonadism in male 04/13/2015  . Chest pain 04/13/2015  . Snoring 04/13/2015  . Fatigue 04/13/2015  . Dyslipidemia 04/13/2015  . Insomnia 07/17/2014  . Anxiety 09/08/2013  . Hyperlipidemia 08/20/2013  . Elevated LFTs 08/20/2013  . Exertional chest pain 08/20/2013    [facility] ,[place], CCC-SLP  05/05/2019, 4:29 PM  [facility] [address] [place], [place], [address] Phone: [phone]   Fax:  [phone]   Name: Brandon Thomas MRN: [id number] Date of Birth: [birth year]

## 2019-05-05 NOTE — Patient Instructions (Signed)
Bring in the phone you will use for your calendar for next session.

## 2019-05-06 ENCOUNTER — Other Ambulatory Visit: Payer: Self-pay | Admitting: Physical Medicine and Rehabilitation

## 2019-05-07 ENCOUNTER — Telehealth: Payer: Self-pay

## 2019-05-07 ENCOUNTER — Other Ambulatory Visit: Payer: Self-pay

## 2019-05-07 ENCOUNTER — Ambulatory Visit: Payer: BLUE CROSS/BLUE SHIELD

## 2019-05-07 DIAGNOSIS — R41841 Cognitive communication deficit: Secondary | ICD-10-CM | POA: Diagnosis not present

## 2019-05-07 MED ORDER — SERTRALINE HCL 100 MG PO TABS: 100.0000 mg | ORAL_TABLET | Freq: Every day | ORAL | 3 refills | Status: DC

## 2019-05-07 NOTE — Telephone Encounter (Signed)
Sent to pharmacy on file [facility]

## 2019-05-07 NOTE — Telephone Encounter (Signed)
Pt's wife called on behalf of pt. Requesting if provider can send in a med refill for sertraline 100 mg/1 tab daily [facility]. Rx written by external provider. As per pt, ok'd by provider at last visit. Pls advise, thanks.

## 2019-05-07 NOTE — Therapy (Signed)
[facility] [address] [place], [place], [address] Phone: [phone]   Fax:  [phone]  Speech Language Pathology Treatment  Patient Details  Name: Brandon Thomas MRN: [id number] Date of Birth: [birth year] Referring Provider (SLP): Reesa Chew, [place]   Encounter Date: 05/07/2019  End of Session - 05/07/19 1634    Visit Number  5    Number of Visits  17    Date for SLP Re-Evaluation  06/16/19    Authorization Type  [employer], 20 visit limit    Authorization - Visit Number  4    Authorization - Number of Visits  20    SLP Start Time  2633    SLP Stop Time   1616    SLP Time Calculation (min)  43 min    Activity Tolerance  Patient tolerated treatment well       Past Medical History:  Diagnosis Date  . Depression    Well treated without meds currently  . Elevated LFTs 08/20/2013  . Hyperlipidemia 08/20/2013    Past Surgical History:  Procedure Laterality Date  . WISDOM TOOTH EXTRACTION      There were no vitals filed for this visit.  Subjective Assessment - 05/07/19 1539    Subjective  Pt arrives today with phone with appointments in calendar, and minimal written in journal.    Currently in Pain?  No/denies            ADULT SLP TREATMENT - 05/07/19 1605      General Information   Behavior/Cognition  Alert;Cooperative;Pleasant mood      Treatment Provided   Treatment provided  Cognitive-Linquistic      Cognitive-Linquistic Treatment   Treatment focused on  Cognition    Skilled Treatment  Pt missed the time of one of the appointment times he put into the calendar app. Unaware until he was cued by SLP to double check the times.  In a functional detailed task (organizaing your day) pt double checked to make sure he had everything on the list but alternating attention distracted pt to skip the last thing. SLP assisted pt/wife how pt could avoid this at home - as wife states it is occurring  (Christmas tree - pt forgot to return following a 15 minute rest). SLP suggested pt put reminder in phone to return to previous task. SLP educated wife on how to gradually provide cues to pt instead of giving pt answer. Pt was told to reschedule 05-26-19 appointmet and so he put reminder in his phone to do so - at 4:15 reminder alarmed and pt successful at making appointment change on his way out without cues from wife.      Assessment / Recommendations / Plan   Plan  Continue with current plan of care      Progression Toward Goals   Progression toward goals  Progressing toward goals       SLP Education - 05/07/19 1634    Education Details  compensaations for alternating attention, how to cue pt (Wfe)    Person(s) Educated  Patient;Spouse    Methods  Explanation;Demonstration    Comprehension  Verbalized understanding;Need further instruction       SLP Short Term Goals - 05/07/19 1636      SLP SHORT TERM GOAL #1   Title  Patient will use compensatory aids (memory notebook, phone) to plan and recall daily schedule, chores, appointments or activities with min A x3 visits.    Baseline  05-07-19  Time  2    Period  Weeks    Status  On-going      SLP SHORT TERM GOAL #2   Title  pt will demo ability to switch between functional tasks naturally with modified independence (compensations, timers, alerts) x 3 sessions    Time  2    Period  Weeks    Status  On-going      SLP SHORT TERM GOAL #3   Title  pt will demonstrate ability to recall information at least 10 minutes later in ST sessions with modified independence (compensations) 95%    Baseline  05-07-19    Time  2    Period  Weeks    Status  On-going       SLP Long Term Goals - 05/07/19 1636      SLP LONG TERM GOAL #1   Title  pt will utilize memory system appropriately during 6 ST sessions    Time  6    Period  Weeks    Status  On-going      SLP LONG TERM GOAL #2   Title  pt will demo ability multitask (e.g., work at  computer and answer simple question) functional tasks naturally with modified indpendence x 3 sessions    Time  6    Period  Weeks    Status  On-going       Plan - 05/07/19 1635    Clinical Impression Statement  Mr. 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 See "skilled intervention" for details of today's session.  I recommend cont'd skilled ST to address cognitive deficits in order to improve pt's safety, increase independence, and for regaining skills/training compensations for possible return to work.    Speech Therapy Frequency  2x / week    Duration  --   8 weeks or 17 visits   Treatment/Interventions  Environmental controls;Cueing hierarchy;SLP instruction and feedback;Cognitive reorganization;Compensatory techniques;Functional tasks;Compensatory strategies;Internal/external aids;Patient/family education    Potential to Achieve Goals  Good       Patient will benefit from skilled therapeutic intervention in order to improve the following deficits and impairments:   Cognitive communication deficit    Problem List Patient Active Problem List   Diagnosis Date Noted  . Moderate hypoxic-ischemic encephalopathy   . CVA (cerebral vascular accident) ([place]) 04/09/2019  . Cellulitis 04/09/2019  . Superficial venous thrombosis of arm, right 04/09/2019  . Edema of right upper arm 04/09/2019  . Substance abuse ([place]) 04/09/2019  . Encephalopathy 04/09/2019  . Anal fissure 04/15/2015  . Hypogonadism in male 04/13/2015  . Chest pain 04/13/2015  . Snoring 04/13/2015  . Fatigue 04/13/2015  . Dyslipidemia 04/13/2015  . Insomnia 07/17/2014  . Anxiety 09/08/2013  . Hyperlipidemia 08/20/2013  . Elevated LFTs 08/20/2013  . Exertional chest pain 08/20/2013    [facility] ,[place], [place]  05/07/2019, 4:38 PM  [facility] [address] [place], [place], [address] Phone: [phone]   Fax:  [phone]   Name: Brandon Thomas MRN: [id number] Date of Birth: 1976/02/17

## 2019-05-08 NOTE — Telephone Encounter (Signed)
Patient's advised.

## 2019-05-12 ENCOUNTER — Ambulatory Visit: Payer: 59

## 2019-05-14 ENCOUNTER — Other Ambulatory Visit: Payer: Self-pay

## 2019-05-14 ENCOUNTER — Ambulatory Visit: Payer: 59

## 2019-05-14 DIAGNOSIS — R41841 Cognitive communication deficit: Secondary | ICD-10-CM | POA: Insufficient documentation

## 2019-05-14 DIAGNOSIS — F1910 Other psychoactive substance abuse, uncomplicated: Secondary | ICD-10-CM | POA: Diagnosis present

## 2019-05-14 DIAGNOSIS — G9340 Encephalopathy, unspecified: Secondary | ICD-10-CM | POA: Diagnosis present

## 2019-05-14 NOTE — Therapy (Signed)
[facility] [address] [place], [place], [address] Phone: [phone]   Fax:  [phone]  Speech Language Pathology Treatment  Patient Details  Name: Brandon Thomas MRN: [id number] Date of Birth: 1975-10-19 Referring Provider (SLP): Reesa Chew, [place]   Encounter Date: 05/14/2019  End of Session - 05/14/19 1652    Visit Number  6    Number of Visits  17    Date for SLP Re-Evaluation  06/16/19    Authorization - Visit Number  5    Authorization - Number of Visits  20    SLP Start Time  0814    SLP Stop Time   4818    SLP Time Calculation (min)  40 min    Activity Tolerance  Patient tolerated treatment well       Past Medical History:  Diagnosis Date  . Depression    Well treated without meds currently  . Elevated LFTs 08/20/2013  . Hyperlipidemia 08/20/2013    Past Surgical History:  Procedure Laterality Date  . WISDOM TOOTH EXTRACTION      There were no vitals filed for this visit.         ADULT SLP TREATMENT - 05/14/19 1506      General Information   Behavior/Cognition  Alert;Cooperative;Pleasant mood      Treatment Provided   Treatment provided  Cognitive-Linquistic      Cognitive-Linquistic Treatment   Treatment focused on  Cognition    Skilled Treatment  Pt has appointment with Dr. Sima Matas tomorrow - pt expressed frustration about his decr'd ability, and his level of frustration about the guilt he feels re: "putting (wife) through this." SLP encouraged pt to write these questions/topics down in his phone to ask [place] tomorrow, and Naaman Plummer later this month - pt did so spontaneously 2/4 items. SLP directed pt to write them down on the reminder for the appointment so as to keep things as organized as possible. Pt wife stated "I think he could drive..." and SLP highlighted fact that Dr. Naaman Plummer would likely be MD to make that decision but that was a topic pt could talk with Naaman Plummer  about. SLP informed pt/wife about ConstantTherapy - asked pt to get username and password and begin to work on this for 45 minutes a couple times a day.      Assessment / Recommendations / Plan   Plan  Continue with current plan of care      Progression Toward Goals   Progression toward goals  Progressing toward goals       SLP Education - 05/14/19 1651    Education Details  constant therapy, memory compensations, talk to Dr. Sima Matas about feelings    Person(s) Educated  Patient;Spouse    Methods  Explanation    Comprehension  Verbalized understanding       SLP Short Term Goals - 05/14/19 1652      SLP SHORT TERM GOAL #1   Title  Patient will use compensatory aids (memory notebook, phone) to plan and recall daily schedule, chores, appointments or activities with min A x3 visits.    Baseline  05-07-19, 05-14-19    Time  1    Period  Weeks    Status  On-going      SLP SHORT TERM GOAL #2   Title  pt will demo ability to switch between functional tasks naturally with modified independence (compensations, timers, alerts) x 3 sessions    Time  1  Period  Weeks    Status  On-going      SLP SHORT TERM GOAL #3   Title  pt will demonstrate ability to recall information at least 10 minutes later in ST sessions with modified independence (compensations) 95%    Baseline  05-07-19    Time  1    Period  Weeks    Status  On-going       SLP Long Term Goals - 05/14/19 1653      SLP LONG TERM GOAL #1   Title  pt will utilize memory system appropriately during 6 ST sessions    Time  5    Period  Weeks    Status  On-going      SLP LONG TERM GOAL #2   Title  pt will demo ability multitask (e.g., work at computer and answer simple question) functional tasks naturally with modified indpendence x 3 sessions    Time  5    Period  Weeks    Status  On-going       Plan - 05/14/19 1652    Clinical Impression Statement  [name] presents with overall mild-moderate cognitive  communication impairment due to encephalopathy s/p overdose; deficits noted continue in attention, memory, executive function (planning/organization), and awareness. See "skilled intervention" for details of today's session. I recommend cont'd skilled ST to address cognitive deficits in order to improve pt's safety, increase independence, and for regaining skills/training compensations for possible return to work.    Speech Therapy Frequency  2x / week    Duration  --   8 weeks or 17 visits   Treatment/Interventions  Environmental controls;Cueing hierarchy;SLP instruction and feedback;Cognitive reorganization;Compensatory techniques;Functional tasks;Compensatory strategies;Internal/external aids;Patient/family education    Potential to Achieve Goals  Good       Patient will benefit from skilled therapeutic intervention in order to improve the following deficits and impairments:   Cognitive communication deficit    Problem List Patient Active Problem List   Diagnosis Date Noted  . Moderate hypoxic-ischemic encephalopathy   . CVA (cerebral vascular accident) ([place]) 04/09/2019  . Cellulitis 04/09/2019  . Superficial venous thrombosis of arm, right 04/09/2019  . Edema of right upper arm 04/09/2019  . Substance abuse ([place]) 04/09/2019  . Encephalopathy 04/09/2019  . Anal fissure 04/15/2015  . Hypogonadism in male 04/13/2015  . Chest pain 04/13/2015  . Snoring 04/13/2015  . Fatigue 04/13/2015  . Dyslipidemia 04/13/2015  . Insomnia 07/17/2014  . Anxiety 09/08/2013  . Hyperlipidemia 08/20/2013  . Elevated LFTs 08/20/2013  . Exertional chest pain 08/20/2013    [facility] ,[place], CCC-SLP  05/14/2019, 4:54 PM  [facility] [address] [place], [place], [address] Phone: [phone]   Fax:  [phone]   Name: Brandon Thomas MRN: [id number] Date of Birth: [birth year]

## 2019-05-14 NOTE — Patient Instructions (Signed)
Constant Therapy  Look into getting a username and password - do 45 minutes a couple times a day

## 2019-05-15 ENCOUNTER — Encounter: Payer: 59 | Attending: Psychology | Admitting: Psychology

## 2019-05-15 DIAGNOSIS — G9340 Encephalopathy, unspecified: Secondary | ICD-10-CM | POA: Diagnosis not present

## 2019-05-15 DIAGNOSIS — F1910 Other psychoactive substance abuse, uncomplicated: Secondary | ICD-10-CM | POA: Insufficient documentation

## 2019-05-16 ENCOUNTER — Encounter: Payer: Self-pay | Admitting: Psychology

## 2019-05-16 NOTE — Progress Notes (Signed)
Neuropsychological Consultation   Patient:   Brandon Thomas   DOB:   [birth year]  MR Number:  [id number]  Location:  [facility] [place], [place] [id number] [place] [address] Dept: [phone]           Date of Service:   05/15/2019  Start Time:   4 PM End Time:   6 PM  Today's visit was a 2-hour long appointment.  The first hour was an in person face-to-face clinical interview with the patient and his wife and a second hour was utilized with records review and report writing.  Provider/Observer:  Ilean Skill, Psy.D.       Clinical Neuropsychologist       Billing Code/Service: 18841, [phone]  Chief Complaint:    Brandon Thomas is a 44 year old male with a history of depression, opioid abuse and previous drug treatment/rehab, and hypogonadism.  The patient was admitted to the hospital on 04/03/2019 after being found by his wife unresponsive and gurgling sounds and CPR was initiated.  He did receive Narcan without response and had decerebrate posturing and concerns of seizure.  Urine drug screen was positive for benzo diazepam but not positive for opiates although the patient admits to using opiates immediately before losing consciousness.  Specific opiate testing for fentanyl was not conducted and the patient has acknowledged that he was using fentanyl at the time of his overdose.  The patient was intubated for airway protection and needed IVF for rhabdo and antibiotics for sepsis felt due to aspiration pneumonia and/or possible right upper extremity cellulitis.  EEG revealed severe diffuse encephalopathy.  MRI brain done revealed restricted diffusion throughout the hippocampi likely due to acute injury from toxic exposure and/or hypoxic event.  The patient continued to have significant short-term memory deficits and problem-solving but his perseverative speech and  orientation have significantly improved during his comprehensive inpatient rehabilitation stay.  The patient continues to complain about his right arm being swollen and having strange sensory sensations with his right arm and hand.  Reason for Service:  Brandon Thomas is a 45 year old male with a history of depression, opioid abuse and previous drug treatment/rehab, and hypogonadism.  The patient was admitted to the hospital on 04/03/2019 after being found by his wife unresponsive and gurgling sounds and CPR was initiated.  He did receive Narcan without response and had decerebrate posturing and concerns of seizure.  Urine drug screen was positive for benzo diazepam but not positive for opiates although the patient admits to using opiates immediately before losing consciousness.  Specific opiate testing for fentanyl was not conducted and the patient has acknowledged that he was using fentanyl at the time of his overdose.  The patient was intubated for airway protection and needed IVF for rhabdo and antibiotics for sepsis felt due to aspiration pneumonia and/or possible right upper extremity cellulitis.  EEG revealed severe diffuse encephalopathy.  MRI brain done revealed restricted diffusion throughout the hippocampi likely due to acute injury from toxic exposure and/or hypoxic event.  The patient continued to have significant short-term memory deficits and problem-solving but his perseverative speech and orientation have significantly improved during his comprehensive inpatient rehabilitation stay.  The patient continues to complain about his right arm being swollen and having strange sensory sensations with his right arm and hand.  The patient has now been discharged from the comprehensive inpatient rehabilitation program and is now back home.  The  patient has made significant recovery in overall functioning but continues to have short-term memory difficulties that while they are improving persist.  The  patient is difficulties remembering small tasks and questions or comments from others but has been able to learn new information and is continuing with rehabilitative efforts.  The patient reports that he has been able to abstain from any substance abuse denies any recent cravings or other features related to his substance abuse history.  The patient reports that while he had had previous long term substance abuse he had gone through rehabilitative efforts and had not been using up until the day that he OD.  The patient reports that this was a singular event and had not been preceded by days of substance use.  The patient is continue to take sertraline as well as a baby aspirin.  The patient reports that he is sleeping normally and reports that he has had a significant increase in appetite recently.  Current Status:  The patient describes ongoing short-term memory deficits but reports that most other cognitive functioning are within normal limits.  Reliability of Information: The information is derived from 1 hour face-to-face clinical interview as well as review of available medical records.  Behavioral Observation: Brandon Thomas  presents as a 44 y.o.-year-old Right Caucasian Male who appeared his stated age. his dress was Appropriate and he was Well Groomed and his manners were Appropriate to the situation.  his participation was indicative of Appropriate and Redirectable behaviors.  There were not any physical disabilities noted.  he displayed an appropriate level of cooperation and motivation.     Interactions:    Active Appropriate and Redirectable  Attention:   abnormal and attention span appeared shorter than expected for age  Memory:   abnormal; remote memory intact, recent memory impaired  Visuo-spatial:  not examined  Speech (Volume):  normal  Speech:   normal; normal  Thought Process:  Coherent and Relevant  Though Content:  WNL; not suicidal and not  homicidal  Orientation:   person, place, time/date and situation  Judgment:   Fair  Planning:   Fair  Affect:    Appropriate  Mood:    Dysphoric  Insight:   Good  Intelligence:   high  Marital Status/Living: The patient was born in [place] and has 2 siblings.  He is married and they have 2 children.  He has been married for the past 12 years and has no previous marriages.  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  Substance Use:  There is a documented history of prescription drug and Opiate-based medications abuse confirmed by the patient.    Education:   Engineering geologist History:   Past Medical History:  Diagnosis Date  . Depression    Well treated without meds currently  . Elevated LFTs 08/20/2013  . Hyperlipidemia 08/20/2013        Psychiatric History:  The patient does have a history of depression and is continuing to take sertraline.  He also has a past history of substance abuse with inpatient rehabilitative admissions.  Family Med/Psych History:  Family History  Adopted: Yes    Impression/DX:  Brandon Thomas is a 44 year old male with a history of depression, opioid abuse and previous drug treatment/rehab, and hypogonadism.  The patient was admitted to the hospital on 04/03/2019 after being found by his wife unresponsive and gurgling  sounds and CPR was initiated.  He did receive Narcan without response and had decerebrate posturing and concerns of seizure.  Urine drug screen was positive for benzo diazepam but not positive for opiates although the patient admits to using opiates immediately before losing consciousness.  Specific opiate testing for fentanyl was not conducted and the patient has acknowledged that he was using fentanyl at the time of his overdose.  The patient was intubated for airway protection and needed IVF for rhabdo and  antibiotics for sepsis felt due to aspiration pneumonia and/or possible right upper extremity cellulitis.  EEG revealed severe diffuse encephalopathy.  MRI brain done revealed restricted diffusion throughout the hippocampi likely due to acute injury from toxic exposure and/or hypoxic event.  The patient continued to have significant short-term memory deficits and problem-solving but his perseverative speech and orientation have significantly improved during his comprehensive inpatient rehabilitation stay.  The patient continues to complain about his right arm being swollen and having strange sensory sensations with his right arm and hand.  The patient describes ongoing short-term memory deficits but reports that most other cognitive functioning are within normal limits.   Disposition/Plan:  At this point, we will focus on therapeutic interventions with the patient during his recovery.  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  The patient is wanting to return to work when he can but is currently not going to work but continuing to retain his job.  The patient is not driving and is also wanting to look forward to the possibility of returning to driving.  He has a follow-up appointment with Dr. Naaman Plummer soon.  Diagnosis:    Moderate hypoxic-ischemic encephalopathy  Substance abuse ([place])         Electronically Signed   _______________________ Ilean Skill, Psy.D.

## 2019-05-19 ENCOUNTER — Other Ambulatory Visit: Payer: Self-pay

## 2019-05-19 ENCOUNTER — Ambulatory Visit: Payer: 59

## 2019-05-19 DIAGNOSIS — G9340 Encephalopathy, unspecified: Secondary | ICD-10-CM | POA: Diagnosis not present

## 2019-05-19 DIAGNOSIS — R41841 Cognitive communication deficit: Secondary | ICD-10-CM

## 2019-05-19 NOTE — Therapy (Signed)
[facility] [address] [place], [place], [address] Phone: [phone]   Fax:  [phone]  Speech Language Pathology Treatment  Patient Details  Name: Brandon Thomas MRN: [id number] Date of Birth: 12/30/1975 Referring Provider (SLP): Reesa Chew, [place]   Encounter Date: 05/19/2019  End of Session - 05/19/19 1506    Visit Number  7    Number of Visits  17    Date for SLP Re-Evaluation  06/16/19    Authorization Type  [employer], 20 visit limit    Authorization - Visit Number  6    Authorization - Number of Visits  20    SLP Start Time  6644    SLP Stop Time   1445    SLP Time Calculation (min)  40 min    Activity Tolerance  Patient tolerated treatment well       Past Medical History:  Diagnosis Date  . Depression    Well treated without meds currently  . Elevated LFTs 08/20/2013  . Hyperlipidemia 08/20/2013    Past Surgical History:  Procedure Laterality Date  . WISDOM TOOTH EXTRACTION      There were no vitals filed for this visit.  Subjective Assessment - 05/19/19 1409    Subjective  Pt recalled 8 events of the weekend, correctly, with WNL details.    Patient is accompained by:  Family member   wife   Currently in Pain?  No/denies    Pain Score  5     Pain Location  Back    Pain Orientation  Upper;Medial    Pain Descriptors / Indicators  Constant    Pain Type  Chronic pain    Pain Frequency  Constant            ADULT SLP TREATMENT - 05/19/19 1415      General Information   Behavior/Cognition  Alert;Cooperative;Pleasant mood      Treatment Provided   Treatment provided  Cognitive-Linquistic      Cognitive-Linquistic Treatment   Treatment focused on  Cognition    Skilled Treatment  Pt reports it has been good with getting to see family and have some activity has been refreshing and have helped in general. Pt reports also that his phone has been good to keep his schedule close at hand.  Pt has not had to have wife remind him to write in his journal but is reminding him to do Constant Therapy. Pt put reminder in his phone for completing Constant Therapy. Pt had two doses of meds one day - he took a dose he missed the previous day and then look another dose when his alarm went off. to take it. Pt did not set a reminder to go to counter and print out schedule for his [facility] appointments; pt did not recall this. SLP repeated request and pt put it into his phone this time. SLP reiterated to pt that he should put things into his phone; SLP educated pt on Outlook reminders/follow up alerts.       Assessment / Recommendations / Plan   Plan  Continue with current plan of care      Progression Toward Goals   Progression toward goals  Progressing toward goals       SLP Education - 05/19/19 1505    Education Details  assume you will not remember something than remember it - and then put it into your phone, use of constant therapy    Person(s) Educated  Patient;Spouse  Methods  Explanation    Comprehension  Verbalized understanding       SLP Short Term Goals - 05/19/19 1508      SLP SHORT TERM GOAL #1   Title  Patient will use compensatory aids (memory notebook, phone) to plan and recall daily schedule, chores, appointments or activities with min A x3 visits.    Baseline  05-07-19, 05-14-19 05-19-19    Status  Achieved      SLP SHORT TERM GOAL #2   Title  pt will demo ability to switch between functional tasks naturally with modified independence (compensations, timers, alerts) x 3 sessions    Baseline  05-14-19, 05-19-19    Status  Partially Met      SLP SHORT TERM GOAL #3   Title  pt will demonstrate ability to recall information at least 10 minutes later in ST sessions with modified independence (compensations) 95%    Baseline  05-07-19    Status  Partially Met       SLP Long Term Goals - 05/19/19 1510      SLP LONG TERM GOAL #1   Title  pt will utilize memory system  appropriately during 6 ST sessions    Baseline  05-19-19    Time  4    Period  Weeks    Status  On-going      SLP LONG TERM GOAL #2   Title  pt will demo ability multitask (e.g., work at computer and answer simple question) functional tasks naturally with modified indpendence x 3 sessions    Time  4    Period  Weeks    Status  On-going       Plan - 05/19/19 1506    Clinical Impression Statement  Brandon Thomas presents with overall mild cognitive communication deficits noted today in attention and memory. Pt's awareness and executive function appear to be improving. See "skilled intervention" for details of today's session. I recommend cont'd skilled ST to address cognitive deficits in order to improve pt's safety, increase independence, and for regaining skills/training compensations for possible return to work.    Speech Therapy Frequency  2x / week    Duration  --   8 weeks or 17 visits   Treatment/Interventions  Environmental controls;Cueing hierarchy;SLP instruction and feedback;Cognitive reorganization;Compensatory techniques;Functional tasks;Compensatory strategies;Internal/external aids;Patient/family education    Potential to Achieve Goals  Good       Patient will benefit from skilled therapeutic intervention in order to improve the following deficits and impairments:   Cognitive communication deficit    Problem List Patient Active Problem List   Diagnosis Date Noted  . Moderate hypoxic-ischemic encephalopathy   . CVA (cerebral vascular accident) ([place]) 04/09/2019  . Cellulitis 04/09/2019  . Superficial venous thrombosis of arm, right 04/09/2019  . Edema of right upper arm 04/09/2019  . Substance abuse ([place]) 04/09/2019  . Encephalopathy 04/09/2019  . Anal fissure 04/15/2015  . Hypogonadism in male 04/13/2015  . Chest pain 04/13/2015  . Snoring 04/13/2015  . Fatigue 04/13/2015  . Dyslipidemia 04/13/2015  . Insomnia 07/17/2014  . Anxiety 09/08/2013  . Hyperlipidemia  08/20/2013  . Elevated LFTs 08/20/2013  . Exertional chest pain 08/20/2013    [facility] ,[place], CCC-SLP  05/19/2019, 3:11 PM  [facility] [address] [place], [place], [address] Phone: [phone]   Fax:  [phone]   Name: Brandon Thomas MRN: [id number] Date of Birth: 30-Dec-1975

## 2019-05-21 ENCOUNTER — Other Ambulatory Visit: Payer: Self-pay

## 2019-05-21 ENCOUNTER — Ambulatory Visit: Payer: 59

## 2019-05-21 DIAGNOSIS — G9340 Encephalopathy, unspecified: Secondary | ICD-10-CM | POA: Diagnosis not present

## 2019-05-21 DIAGNOSIS — R41841 Cognitive communication deficit: Secondary | ICD-10-CM

## 2019-05-21 NOTE — Therapy (Signed)
[facility] [address] [place], [place], [address] Phone: [phone]   Fax:  [phone]  Speech Language Pathology Treatment  Patient Details  Name: Brandon Thomas MRN: [id number] Date of Birth: 09/29/1975 Referring Provider (SLP): Reesa Chew, [place]   Encounter Date: 05/21/2019  End of Session - 05/21/19 1433    Visit Number  8    Number of Visits  17    Date for SLP Re-Evaluation  06/16/19    Authorization Type  [employer], 20 visit limit    Authorization - Visit Number  7    Authorization - Number of Visits  20    SLP Start Time  3833    SLP Stop Time   1400    SLP Time Calculation (min)  42 min    Activity Tolerance  Patient tolerated treatment well       Past Medical History:  Diagnosis Date  . Depression    Well treated without meds currently  . Elevated LFTs 08/20/2013  . Hyperlipidemia 08/20/2013    Past Surgical History:  Procedure Laterality Date  . WISDOM TOOTH EXTRACTION      There were no vitals filed for this visit.         ADULT SLP TREATMENT - 05/21/19 1323      General Information   Behavior/Cognition  Alert;Cooperative;Pleasant mood      Treatment Provided   Treatment provided  Cognitive-Linquistic      Cognitive-Linquistic Treatment   Treatment focused on  Cognition    Skilled Treatment  "I was in denial a bit before, and I have to get serious about getting better." SLP had pt explain what went into his planning trips for work - SLP had pt plan flight, car and hotel to [employer] to [place]. Pt req'd min verbal cues for details x2. Pt stated that trip planning for work was "2nd nature" for him so homework to plan a weekend trip to [place] with one outing - he will plan things even that wife normally plans. Today, while pt was searching for flight and hotel, SLP asked pt simple questions and pt usually with wlight pausing to answer.      Assessment / Recommendations /  Plan   Plan  Continue with current plan of care      Progression Toward Goals   Progression toward goals  Progressing toward goals         SLP Short Term Goals - 05/19/19 1508      SLP SHORT TERM GOAL #1   Title  Patient will use compensatory aids (memory notebook, phone) to plan and recall daily schedule, chores, appointments or activities with min A x3 visits.    Baseline  05-07-19, 05-14-19 05-19-19    Status  Achieved      SLP SHORT TERM GOAL #2   Title  pt will demo ability to switch between functional tasks naturally with modified independence (compensations, timers, alerts) x 3 sessions    Baseline  05-14-19, 05-19-19    Status  Partially Met      SLP SHORT TERM GOAL #3   Title  pt will demonstrate ability to recall information at least 10 minutes later in ST sessions with modified independence (compensations) 95%    Baseline  05-07-19    Status  Partially Met       SLP Long Term Goals - 05/21/19 1434      SLP LONG TERM GOAL #1   Title  pt will utilize memory system appropriately during 6 ST sessions    Baseline  05-19-19, 05-21-19    Time  4    Period  Weeks    Status  On-going      SLP LONG TERM GOAL #2   Title  pt will demo ability to multitask (e.g., work at computer and answer simple question) functional tasks naturally with modified indpendence x 3 sessions    Time  4    Period  Weeks    Status  On-going       Plan - 05/21/19 1433    Clinical Impression Statement  Pt cont to present with mild cognitive communication deficits noted today in attention and memory. See "skilled intervention" for details of today's session. I recommend cont'd skilled ST to address cognitive deficits in order to improve pt's safety, increase independence, and for regaining skills/training compensations for possible return to work.    Speech Therapy Frequency  2x / week    Duration  --   8 weeks or 17 visits   Treatment/Interventions  Environmental controls;Cueing hierarchy;SLP  instruction and feedback;Cognitive reorganization;Compensatory techniques;Functional tasks;Compensatory strategies;Internal/external aids;Patient/family education    Potential to Achieve Goals  Good       Patient will benefit from skilled therapeutic intervention in order to improve the following deficits and impairments:   Cognitive communication deficit    Problem List Patient Active Problem List   Diagnosis Date Noted  . Moderate hypoxic-ischemic encephalopathy   . CVA (cerebral vascular accident) ([place]) 04/09/2019  . Cellulitis 04/09/2019  . Superficial venous thrombosis of arm, right 04/09/2019  . Edema of right upper arm 04/09/2019  . Substance abuse ([place]) 04/09/2019  . Encephalopathy 04/09/2019  . Anal fissure 04/15/2015  . Hypogonadism in male 04/13/2015  . Chest pain 04/13/2015  . Snoring 04/13/2015  . Fatigue 04/13/2015  . Dyslipidemia 04/13/2015  . Insomnia 07/17/2014  . Anxiety 09/08/2013  . Hyperlipidemia 08/20/2013  . Elevated LFTs 08/20/2013  . Exertional chest pain 08/20/2013    [facility] ,[place], CCC-SLP  05/21/2019, 2:36 PM  [facility] [address] [place], [place], [address] Phone: [phone]   Fax:  [phone]   Name: Brandon Thomas MRN: [id number] Date of Birth: December 10, 1975

## 2019-05-28 ENCOUNTER — Other Ambulatory Visit: Payer: Self-pay

## 2019-05-28 ENCOUNTER — Ambulatory Visit: Payer: 59

## 2019-05-28 DIAGNOSIS — G9340 Encephalopathy, unspecified: Secondary | ICD-10-CM | POA: Diagnosis not present

## 2019-05-28 DIAGNOSIS — R41841 Cognitive communication deficit: Secondary | ICD-10-CM

## 2019-05-28 NOTE — Therapy (Signed)
[facility] [address] [place], [place], [address] Phone: [phone]   Fax:  [phone]  Speech Language Pathology Treatment  Patient Details  Name: Brandon Thomas MRN: [id number] Date of Birth: Jul 04, 1975 Referring Provider (SLP): Reesa Chew, [place]   Encounter Date: 05/28/2019  End of Session - 05/28/19 1653    Visit Number  9    Number of Visits  17    Date for SLP Re-Evaluation  06/16/19    Authorization Type  [employer], 20 visit limit    Authorization - Visit Number  8    Authorization - Number of Visits  20    SLP Start Time  9211    SLP Stop Time   1450    SLP Time Calculation (min)  45 min    Activity Tolerance  Patient tolerated treatment well       Past Medical History:  Diagnosis Date  . Depression    Well treated without meds currently  . Elevated LFTs 08/20/2013  . Hyperlipidemia 08/20/2013    Past Surgical History:  Procedure Laterality Date  . WISDOM TOOTH EXTRACTION      There were no vitals filed for this visit.  Subjective Assessment - 05/28/19 1409    Subjective  Pt recalled his homework correctly. Pt has booked 3 surgeries since last session.    Patient is accompained by:  Family member   [name] - wife   Currently in Pain?  No/denies            ADULT SLP TREATMENT - 05/28/19 1420      General Information   Behavior/Cognition  Alert;Cooperative;Pleasant mood      Treatment Provided   Treatment provided  Cognitive-Linquistic      Cognitive-Linquistic Treatment   Treatment focused on  Cognition    Skilled Treatment  Pt booked 3 surgeries since last session and expressed a little concern that work associates are going to cont to rely upon him for responsibilities and he states stress from work was one of the reasons he feels he had the accident. SLP suggested pt talk with Dr. Sima Matas and/or another professional about stress management. Pt suggested NA meetings, (wife  has mentioned [employer] NA meetings and pt did not count that out), and that he has a friend who could relate to him that he may talk with PRN. Pt explained homework with min cues from wife, rarely. For his homework, pt work trip was [place] and was feasible with departure times taking into consideration [place] traffic, etc. For the trip to the [place], pt's planning was functional for someone who hadn't packed for his children and planned food for the duration of the trip. Pt wife stated she always brings a Stage manager of items whilch pt knows about that he forgot to add to his list. AFterward, SLP learned wife cued pt on some of the details of the trip. SLP told pt/wife to write down any things occuring at home re: reduced organziation/planning. Wife mentioned pt didn't turn off stove and she held back for a bit but then told him he left the stove on. SLP cued wife how to better cue pt, and also suggested some compensations.       Assessment / Recommendations / Plan   Plan  Continue with current plan of care      Progression Toward Goals   Progression toward goals  Progressing toward goals       SLP Education - 05/28/19 1652  Education Details  how to cue pt for memory (wife)    Person(s) Educated  Patient;Spouse    Methods  Explanation;Demonstration    Comprehension  Verbalized understanding       SLP Short Term Goals - 05/19/19 1508      SLP SHORT TERM GOAL #1   Title  Patient will use compensatory aids (memory notebook, phone) to plan and recall daily schedule, chores, appointments or activities with min A x3 visits.    Baseline  05-07-19, 05-14-19 05-19-19    Status  Achieved      SLP SHORT TERM GOAL #2   Title  pt will demo ability to switch between functional tasks naturally with modified independence (compensations, timers, alerts) x 3 sessions    Baseline  05-14-19, 05-19-19    Status  Partially Met      SLP SHORT TERM GOAL #3   Title  pt will demonstrate ability to recall information at  least 10 minutes later in ST sessions with modified independence (compensations) 95%    Baseline  05-07-19    Status  Partially Met       SLP Long Term Goals - 05/28/19 1654      SLP LONG TERM GOAL #1   Title  pt will utilize memory system appropriately during 6 ST sessions    Baseline  05-19-19, 05-21-19, 05-28-19    Time  3    Period  Weeks    Status  On-going      SLP LONG TERM GOAL #2   Title  pt will demo ability to multitask (e.g., work at computer and answer simple question) functional tasks naturally with modified indpendence x 3 sessions    Time  3    Period  Weeks    Status  On-going       Plan - 05/28/19 1654    Clinical Impression Statement  Pt cont to present with mild cognitive communication deficits noted today in attention and memory. See "skilled intervention" for details of today's session. SLP again cued wife how best to cue pt for memory skills. I recommend cont'd skilled ST to address cognitive deficits in order to improve pt's safety, increase independence, and for regaining skills/training compensations for possible return to work.    Speech Therapy Frequency  2x / week    Duration  --   8 weeks or 17 visits   Treatment/Interventions  Environmental controls;Cueing hierarchy;SLP instruction and feedback;Cognitive reorganization;Compensatory techniques;Functional tasks;Compensatory strategies;Internal/external aids;Patient/family education    Potential to Achieve Goals  Good       Patient will benefit from skilled therapeutic intervention in order to improve the following deficits and impairments:   Cognitive communication deficit    Problem List Patient Active Problem List   Diagnosis Date Noted  . Moderate hypoxic-ischemic encephalopathy   . CVA (cerebral vascular accident) ([place]) 04/09/2019  . Cellulitis 04/09/2019  . Superficial venous thrombosis of arm, right 04/09/2019  . Edema of right upper arm 04/09/2019  . Substance abuse ([facility] [place]) 04/09/2019  .  Encephalopathy 04/09/2019  . Anal fissure 04/15/2015  . Hypogonadism in male 04/13/2015  . Chest pain 04/13/2015  . Snoring 04/13/2015  . Fatigue 04/13/2015  . Dyslipidemia 04/13/2015  . Insomnia 07/17/2014  . Anxiety 09/08/2013  . Hyperlipidemia 08/20/2013  . Elevated LFTs 08/20/2013  . Exertional chest pain 08/20/2013    [facility] ,MS, CCC-SLP  05/28/2019, 4:55 PM  [facility] [address] [place], [place], [address] Phone: [phone]  Fax:  [phone]   Name: Brandon Thomas MRN: [id number] Date of Birth: February 12, 1976

## 2019-05-30 ENCOUNTER — Ambulatory Visit: Payer: 59

## 2019-06-04 ENCOUNTER — Other Ambulatory Visit: Payer: Self-pay

## 2019-06-04 ENCOUNTER — Encounter: Payer: Self-pay | Admitting: Physical Medicine & Rehabilitation

## 2019-06-04 ENCOUNTER — Ambulatory Visit: Payer: 59

## 2019-06-04 ENCOUNTER — Encounter: Payer: 59 | Admitting: Physical Medicine & Rehabilitation

## 2019-06-04 VITALS — BP 110/76 | HR 90 | Temp 98.1°F | Ht 71.0 in | Wt 210.0 lb

## 2019-06-04 DIAGNOSIS — F1910 Other psychoactive substance abuse, uncomplicated: Secondary | ICD-10-CM | POA: Diagnosis not present

## 2019-06-04 DIAGNOSIS — R41841 Cognitive communication deficit: Secondary | ICD-10-CM

## 2019-06-04 DIAGNOSIS — G9340 Encephalopathy, unspecified: Secondary | ICD-10-CM | POA: Diagnosis not present

## 2019-06-04 NOTE — Patient Instructions (Addendum)
PLEASE TRY TO WORK ON CONSISTENT SLEEP  GET INVOLVED WITH THE [facility] AGAIN  YOU MAY DRIVE LOCALLY DURING THE DAY.   TALK TO YOUR BOSS ABOUT A RETURN TO WORK ON A PART TIME BASIS IN EARLY MARCH.

## 2019-06-04 NOTE — Therapy (Signed)
[facility] [address] [place], [place], [address] Phone: [phone]   Fax:  [phone]  Speech Language Pathology Treatment  Patient Details  Name: Brandon Thomas MRN: [id number] Date of Birth: 05-May-1976 Referring Provider (SLP): Reesa Chew, [place]   Encounter Date: 06/04/2019  End of Session - 06/04/19 1724    Visit Number  10    Number of Visits  17    Date for SLP Re-Evaluation  06/16/19    Authorization Type  [employer], 20 visit limit    Authorization - Visit Number  9    Authorization - Number of Visits  20    SLP Start Time  3016    SLP Stop Time   1448    SLP Time Calculation (min)  42 min    Activity Tolerance  Patient tolerated treatment well       Past Medical History:  Diagnosis Date  . Depression    Well treated without meds currently  . Elevated LFTs 08/20/2013  . Hyperlipidemia 08/20/2013    Past Surgical History:  Procedure Laterality Date  . WISDOM TOOTH EXTRACTION      There were no vitals filed for this visit.  Subjective Assessment - 06/04/19 1413    Subjective  "I already did the work trip - - yah."    Patient is accompained by:  Family member   wife   Currently in Pain?  No/denies            ADULT SLP TREATMENT - 06/04/19 1414      General Information   Behavior/Cognition  Alert;Cooperative;Pleasant mood      Treatment Provided   Treatment provided  Cognitive-Linquistic      Cognitive-Linquistic Treatment   Treatment focused on  Cognition    Skilled Treatment  "Something that took me 5 or so minutes before (setting up a new account) took me a lot longer this time." Pt explained to SLP that he tried to squeeze in a task proir to ST that he couldn't do (decr'd executive function) and got frustrated at himself for inability to be as fast as he would have been prior to hospitalization. SLP asked pt to problem solve and pt stated he could have asked an admin assistant  to send him the forms necessary. SLP  agreed and encouraged pt to take a break from the task when he needed to leave, or pre-plan to do what he could prior to leaving then finishing upon return. Pt's homework was completed with excellent success - planned weekend in [place] with wife with lodging, dinners, clothes, childcare, and 7 possibilities for activities. Possible decr to once/week discussed next session.      Assessment / Recommendations / Plan   Plan  Continue with current plan of care      Progression Toward Goals   Progression toward goals  Progressing toward goals         SLP Short Term Goals - 05/19/19 1508      SLP SHORT TERM GOAL #1   Title  Patient will use compensatory aids (memory notebook, phone) to plan and recall daily schedule, chores, appointments or activities with min A x3 visits.    Baseline  05-07-19, 05-14-19 05-19-19    Status  Achieved      SLP SHORT TERM GOAL #2   Title  pt will demo ability to switch between functional tasks naturally with modified independence (compensations, timers, alerts) x 3 sessions    Baseline  05-14-19, 05-19-19    Status  Partially Met      SLP SHORT TERM GOAL #3   Title  pt will demonstrate ability to recall information at least 10 minutes later in ST sessions with modified independence (compensations) 95%    Baseline  05-07-19    Status  Partially Met       SLP Long Term Goals - 06/04/19 1725      SLP LONG TERM GOAL #1   Title  pt will utilize memory system appropriately during 6 ST sessions    Baseline  05-19-19, 05-21-19, 05-28-19, 06-04-19    Time  2    Period  Weeks    Status  On-going      SLP LONG TERM GOAL #2   Title  pt will demo ability to multitask (e.g., work at computer and answer simple question) functional tasks naturally with modified indpendence x 3 sessions    Time  2    Period  Weeks    Status  On-going       Plan - 06/04/19 1725    Clinical Impression Statement  Pt cont to present with mild  cognitive communication deficits. See "skilled intervention" for details of today's session.Pt may decr to once/week next session depending on how the next week goes for him. I recommend cont'd skilled ST to address cognitive deficits in order to improve pt's safety, increase independence, and for regaining skills/training compensations for possible return to work.    Speech Therapy Frequency  2x / week    Duration  --   8 weeks or 17 visits   Treatment/Interventions  Environmental controls;Cueing hierarchy;SLP instruction and feedback;Cognitive reorganization;Compensatory techniques;Functional tasks;Compensatory strategies;Internal/external aids;Patient/family education    Potential to Achieve Goals  Good       Patient will benefit from skilled therapeutic intervention in order to improve the following deficits and impairments:   Cognitive communication deficit    Problem List Patient Active Problem List   Diagnosis Date Noted  . Moderate hypoxic-ischemic encephalopathy   . CVA (cerebral vascular accident) ([place]) 04/09/2019  . Cellulitis 04/09/2019  . Superficial venous thrombosis of arm, right 04/09/2019  . Edema of right upper arm 04/09/2019  . Substance abuse ([place]) 04/09/2019  . Encephalopathy 04/09/2019  . Anal fissure 04/15/2015  . Hypogonadism in male 04/13/2015  . Chest pain 04/13/2015  . Snoring 04/13/2015  . Fatigue 04/13/2015  . Dyslipidemia 04/13/2015  . Insomnia 07/17/2014  . Anxiety 09/08/2013  . Hyperlipidemia 08/20/2013  . Elevated LFTs 08/20/2013  . Exertional chest pain 08/20/2013    [facility] ,[place], [place]  06/04/2019, 5:26 PM  [facility] [address] [place], [place], [address] Phone: [phone]   Fax:  [phone]   Name: Brandon Thomas MRN: [id number] Date of Birth: 10-13-1975

## 2019-06-04 NOTE — Progress Notes (Signed)
Subjective:    Patient ID: Brandon Thomas, male    DOB: 04-03-1976, 44 y.o.   MRN: [id number]  HPI  This is a follow-up outpatient office visit for 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  He has been home since early December.  His wife has been with him providing 24-hour supervision.  He has noticed STM deficits and problems with attention and focus. He is keeping a calendar and has an app which keeps him organized. He uses a Engineer, site to help. He finds that he needs extra time to organize and it's harder to stay focused.  He continues to work with outpatient speech therapy on cognition and compensatory strategies.  His therapies have decreased to weekly.  He works as a Public relations account executive. He states that there is no deadline to return to work although he is getting paid leave at present he feels that the "holiday" will end sometime soon.  He describes a relationship with the company which is lasted for some time.  It appears that he is well thought of in the organization as well.  He has developed a lot of long-lasting relationships with clients and companies.  Emotionally, he finds that he can be frustrated at times when he fatigues. His anxiety is quite high since being home. He's often bored but was anxious previously at times d/t the stress of his jobs.  For mood he is on Zoloft 100 mg daily at present.  He states that he really enjoys going there for treatments and visits but hasn't initiated any contact with the [facility]. He states that he's willing to go but hasn't committed to starting it yet.     Pain Inventory Average Pain 0 Pain Right Now 0 My pain is NA  In the last 24 hours, has pain interfered with the following? General activity 0 Relation with others 0 Enjoyment of life 0 What TIME of day is your pain at its worst? NA Sleep (in general) Fair  Pain is worse with: NA Pain improves  with: NA Relief from Meds: na  Mobility walk without assistance ability to climb steps?  yes do you drive?  no  Function employed # of hrs/week 5  Neuro/Psych numbness confusion anxiety  Prior Studies Any changes since last visit?  no  Physicians involved in your care Any changes since last visit?  no   Family History  Adopted: Yes   Social History   Socioeconomic History  . Marital status: Married    Spouse name: Not on file  . Number of children: 2  . Years of education: Not on file  . Highest education level: Not on file  Occupational History  . Occupation: Press photographer  Tobacco Use  . Smoking status: Never Smoker  . Smokeless tobacco: Current User    Types: Chew  Substance and Sexual Activity  . Alcohol use: Yes    Alcohol/week: 1.0 standard drinks    Types: 1 Standard drinks or equivalent per week  . Drug use: No  . Sexual activity: Yes    Partners: Female  Other Topics Concern  . Not on file  Social History Narrative   Two children ages 26 and 38.  Happily married x 9 years.     Social Determinants of Health   Financial Resource Strain:   . Difficulty of Paying Living Expenses: Not on file  Food Insecurity:   . Worried About  Running Out of Food in the Last Year: Not on file  . Ran Out of Food in the Last Year: Not on file  Transportation Needs:   . Lack of Transportation (Medical): Not on file  . Lack of Transportation (Non-Medical): Not on file  Physical Activity:   . Days of Exercise per Week: Not on file  . Minutes of Exercise per Session: Not on file  Stress:   . Feeling of Stress : Not on file  Social Connections:   . Frequency of Communication with Friends and Family: Not on file  . Frequency of Social Gatherings with Friends and Family: Not on file  . Attends Religious Services: Not on file  . Active Member of Clubs or Organizations: Not on file  . Attends Archivist Meetings: Not on file  . Marital Status: Not on file   Past  Surgical History:  Procedure Laterality Date  . WISDOM TOOTH EXTRACTION     Past Medical History:  Diagnosis Date  . Depression    Well treated without meds currently  . Elevated LFTs 08/20/2013  . Hyperlipidemia 08/20/2013   There were no vitals taken for this visit.  Opioid Risk Score:   Fall Risk Score:  `1  Depression screen PHQ 2/9  No flowsheet data found.   Review of Systems  Constitutional: Negative.   HENT: Negative.   Eyes: Negative.   Respiratory: Positive for apnea.   Cardiovascular: Negative.   Gastrointestinal: Negative.   Endocrine: Negative.   Genitourinary: Negative.   Musculoskeletal: Positive for arthralgias and back pain.  Skin: Negative.   Allergic/Immunologic: Negative.   Neurological: Positive for numbness.  Hematological: Negative.   Psychiatric/Behavioral: Positive for confusion. The patient is nervous/anxious.   All other systems reviewed and are negative.      Objective:   Physical Exam  General: No acute distress HEENT: EOMI, oral membranes moist Cards: reg rate  Chest: normal effort Abdomen: Soft, NT, ND Skin: dry, intact Extremities: no edema    Neurological: He is alert.AOx2.  He is able to name the month and year but missed the date.  He said it was the seventh. Motor 5/5 in all 4 limbs essentially.  Gait and balance are functional.  We did some brief cognitive testing.  He had some struggles with organizing and sequencing numbers.  He did better with the letters.  Abstract thinking was generally appropriate.  He recalled 3 out of 3 words after 5 minutes.  He was able to spell the word world forward and backwards with some extra time.  Sometimes struggled with attention during conversation but language is normal.  He had a reasonable insight and awareness.    Psychiatric:  Overall he was fairly appropriate and pleasant.  He would become anxious at times when describing some elements of his job as well as his substance abuse  history.      Assessment & Plan:  1.Impaired cognition and ADLssecondary to encephalopathy from fentanyl overdose. -Patient demonstrating nice progress already although still has some short-term memory and concentration deficits.  It sounds as if there were some concentration deficits at baseline. 2. Mood: Overall mood is improving.  He does demonstrate some anxiety at times which may be out of work demands as well as actually feeling bored at home.  -Continue Zoloft 100 mg daily  -I would like to address some anxiety by working on his ongoing compensatory strategies for memory and organization.  Also needs to develop skills in dealing with  stressful situations.  I believe that he can acquire some of that therapy at the [facility]. Initiating therapies and sessions there should be a priority.   Dr. Sima Matas is also an option for him here, although his available visits are far out right now.  -Consider an increase in Zoloft or starting BuSpar if anxiety remains an issue.  I would not prescribe him any benzodiazepines. 3.  Driving: I did give him permission to drive locally.  There should be any problems here.  I did ask his wife to take him out on a couple rides at first to see how he does.  He is going out at least once already with her and done fairly well 4.  Return to work: I think that there is potential for him to return to work in early March.  I did asked that he speak with his boss regarding a potential return to work schedule.  I would think that he would need to work on a part-time basis initially without any travel.  20 hours a week might be a starting point, perhaps even less.  I do not think that we need to do any formal cognitive testing.  He just needs to continue working on compensatory strategies and organization with therapy and on his own at home.  He seems motivated to use the strategies to help himself succeed.  He also has a lot of familiarity with the job which  will help him  30 minutes was spent with the patient and his wife today in the office.  All questions were encouraged and answered.  I will see him back in about 6 weeks time.Marland Kitchen

## 2019-06-04 NOTE — Patient Instructions (Signed)
WRite down any situations with your memory, organization, or planning that are difficult/challenging/frustrating.

## 2019-06-06 ENCOUNTER — Ambulatory Visit: Payer: 59

## 2019-06-11 ENCOUNTER — Other Ambulatory Visit: Payer: Self-pay

## 2019-06-11 ENCOUNTER — Ambulatory Visit: Payer: 59 | Attending: Physical Medicine and Rehabilitation

## 2019-06-11 DIAGNOSIS — R41841 Cognitive communication deficit: Secondary | ICD-10-CM | POA: Diagnosis not present

## 2019-06-11 NOTE — Therapy (Signed)
[facility] [address] [place], [place], [address] Phone: [phone]   Fax:  [phone]  Speech Language Pathology Treatment  Patient Details  Name: Brandon Thomas MRN: [id number] Date of Birth: [birth year] Referring Provider (SLP): Reesa Chew, [place]   Encounter Date: 06/11/2019  End of Session - 06/11/19 1442    Visit Number  11    Number of Visits  17    Date for SLP Re-Evaluation  06/16/19    Authorization Type  [employer], 20 visit limit    Authorization - Visit Number  10    Authorization - Number of Visits  20    SLP Start Time  1103    SLP Stop Time   1400    SLP Time Calculation (min)  41 min    Activity Tolerance  Patient tolerated treatment well       Past Medical History:  Diagnosis Date  . Depression    Well treated without meds currently  . Elevated LFTs 08/20/2013  . Hyperlipidemia 08/20/2013    Past Surgical History:  Procedure Laterality Date  . WISDOM TOOTH EXTRACTION      There were no vitals filed for this visit.  Subjective Assessment - 06/11/19 1322    Subjective  "The deficit is more consequential."    Patient is accompained by:  Family member   danielle - wife   Currently in Pain?  No/denies            ADULT SLP TREATMENT - 06/11/19 1325      General Information   Behavior/Cognition  Alert;Cooperative;Pleasant mood      Treatment Provided   Treatment provided  Cognitive-Linquistic      Cognitive-Linquistic Treatment   Treatment focused on  Cognition    Skilled Treatment  Pt driving locally, MD told pt likely return to work 07-07-19 on modified schedule. Pt using his phone to input reminder and notes in today's session. Pt able to attend to SLP simple converstion and input type into his phone together with modified independence. Pt demonstrates anticipatory awareness for return to work. Wife mentions she made plans for dinner with friends and texted pt but he did not  recall what the details were. SLP suggested pt put more things on calendar than he would have before hospitalization. Pt agreed.  Pt's explantion/details eR: what he needs to speak to his boss about re: return to work (per Naaman Plummer)" was lacking details. SLP told pt to make a list of talking points to say to his boss. Pt planned when to have the meeting with his boss and he will report back to SLP via email confirming he had the meeting.       Assessment / Recommendations / Plan   Plan  Continue with current plan of care      Progression Toward Goals   Progression toward goals  Progressing toward goals       SLP Education - 06/11/19 1441    Education Details  organization talking to boss    Person(s) Educated  Patient    Methods  Explanation    Comprehension  Verbalized understanding       SLP Short Term Goals - 05/19/19 1508      SLP SHORT TERM GOAL #1   Title  Patient will use compensatory aids (memory notebook, phone) to plan and recall daily schedule, chores, appointments or activities with min A x3 visits.    Baseline  05-07-19, 05-14-19 05-19-19  Status  Achieved      SLP SHORT TERM GOAL #2   Title  pt will demo ability to switch between functional tasks naturally with modified independence (compensations, timers, alerts) x 3 sessions    Baseline  05-14-19, 05-19-19    Status  Partially Met      SLP SHORT TERM GOAL #3   Title  pt will demonstrate ability to recall information at least 10 minutes later in ST sessions with modified independence (compensations) 95%    Baseline  05-07-19    Status  Partially Met       SLP Long Term Goals - 06/11/19 1443      SLP LONG TERM GOAL #1   Title  pt will utilize memory system appropriately during 6 ST sessions    Baseline  05-19-19, 05-21-19, 05-28-19, 06-04-19, 06-11-19    Time  1    Period  Weeks    Status  On-going      SLP LONG TERM GOAL #2   Title  pt will demo ability to multitask (e.g., work at computer and answer simple question)  functional tasks naturally with modified indpendence x 3 sessions    Baseline  06-11-19    Time  1    Period  Weeks    Status  On-going       Plan - 06/11/19 1442    Clinical Impression Statement  Pt cont to present with mild cognitive communication deficits. See "skilled intervention" for details of today's session.Pt decr'd to once/week due to progress. I recommend cont'd skilled ST to address cognitive deficits in order to improve pt's safety, increase independence, and for regaining skills/training compensations for possible return to work.    Speech Therapy Frequency  1x /week    Duration  --   8 weeks or 17 visits   Treatment/Interventions  Environmental controls;Cueing hierarchy;SLP instruction and feedback;Cognitive reorganization;Compensatory techniques;Functional tasks;Compensatory strategies;Internal/external aids;Patient/family education    Potential to Achieve Goals  Good       Patient will benefit from skilled therapeutic intervention in order to improve the following deficits and impairments:   Cognitive communication deficit    Problem List Patient Active Problem List   Diagnosis Date Noted  . Moderate hypoxic-ischemic encephalopathy   . CVA (cerebral vascular accident) ([place]) 04/09/2019  . Cellulitis 04/09/2019  . Superficial venous thrombosis of arm, right 04/09/2019  . Edema of right upper arm 04/09/2019  . Substance abuse ([place]) 04/09/2019  . Encephalopathy 04/09/2019  . Anal fissure 04/15/2015  . Hypogonadism in male 04/13/2015  . Chest pain 04/13/2015  . Snoring 04/13/2015  . Fatigue 04/13/2015  . Dyslipidemia 04/13/2015  . Insomnia 07/17/2014  . Anxiety 09/08/2013  . Hyperlipidemia 08/20/2013  . Elevated LFTs 08/20/2013  . Exertional chest pain 08/20/2013    [facility] ,[place], [place]  06/11/2019, 2:48 PM  [facility] [address] [place], [place], [address] Phone: [phone]   Fax:   [phone]   Name: Brandon Thomas MRN: [id number] Date of Birth: 01-13-1976

## 2019-06-13 ENCOUNTER — Ambulatory Visit: Payer: 59

## 2019-06-18 ENCOUNTER — Ambulatory Visit: Payer: 59

## 2019-06-20 ENCOUNTER — Ambulatory Visit: Payer: 59

## 2019-06-27 ENCOUNTER — Other Ambulatory Visit: Payer: Self-pay

## 2019-06-27 ENCOUNTER — Ambulatory Visit: Payer: 59

## 2019-06-27 DIAGNOSIS — R41841 Cognitive communication deficit: Secondary | ICD-10-CM

## 2019-06-27 NOTE — Therapy (Signed)
[facility] [address] [place], [place], [address] Phone: [phone]   Fax:  [phone]  Speech Language Pathology Treatment  Patient Details  Name: Brandon Thomas MRN: [id number] Date of Birth: May 22, 1975 Referring Provider (SLP): Reesa Chew, [place]   Encounter Date: 06/27/2019  End of Session - 06/27/19 1641    Visit Number  12    Number of Visits  17    Date for SLP Re-Evaluation  08/01/19    Authorization Type  BCBS, 20 visit limit    Authorization - Visit Number  11    Authorization - Number of Visits  20    SLP Start Time  7182    SLP Stop Time   1445    SLP Time Calculation (min)  42 min    Activity Tolerance  Patient tolerated treatment well       Past Medical History:  Diagnosis Date  . Depression    Well treated without meds currently  . Elevated LFTs 08/20/2013  . Hyperlipidemia 08/20/2013    Past Surgical History:  Procedure Laterality Date  . WISDOM TOOTH EXTRACTION      There were no vitals filed for this visit.  Subjective Assessment - 06/27/19 1407    Subjective  Pt reports he spoke to Aaron Edelman (boss) about his reduced job responsibilities.    Patient is accompained by:  --   wife Brandon Thomas   Currently in Pain?  No/denies            ADULT SLP TREATMENT - 06/27/19 1408      General Information   Behavior/Cognition  Alert;Cooperative;Pleasant mood      Treatment Provided   Treatment provided  Cognitive-Linquistic      Cognitive-Linquistic Treatment   Treatment focused on  Cognition    Skilled Treatment  Pt writing down daily tasks, pt is sending texts in order to look back to have written details - showing anticipatory awareness. Wife reprots pt forgets things "in the moment" if he does not do them immedately, but this is occurring less. Pt knows he could use his timer/alarm on his phone. His work trip this past weekend went well - "I was a little anxious but as soon as we  got started it was ok." SLP/pt/wife agree pt would benefit from return in approx 4 weeks. Possble/probable d/c at that time.       Assessment / Recommendations / Plan   Plan  Continue with current plan of care   possible d/c next visit     Progression Toward Goals   Progression toward goals  Progressing toward goals         SLP Short Term Goals - 05/19/19 1508      SLP SHORT TERM GOAL #1   Title  Patient will use compensatory aids (memory notebook, phone) to plan and recall daily schedule, chores, appointments or activities with min A x3 visits.    Baseline  05-07-19, 05-14-19 05-19-19    Status  Achieved      SLP SHORT TERM GOAL #2   Title  pt will demo ability to switch between functional tasks naturally with modified independence (compensations, timers, alerts) x 3 sessions    Baseline  05-14-19, 05-19-19    Status  Partially Met      SLP SHORT TERM GOAL #3   Title  pt will demonstrate ability to recall information at least 10 minutes later in ST sessions with modified independence (compensations) 95%  Baseline  05-07-19    Status  Partially Met       SLP Long Term Goals - 06/27/19 1643      SLP LONG TERM GOAL #1   Title  pt will utilize memory system appropriately during 6 ST sessions    Baseline  05-19-19, 05-21-19, 05-28-19, 06-04-19, 06-11-19, 06-27-19    Time  1    Period  Weeks    Status  On-going      SLP LONG TERM GOAL #2   Title  pt will demo ability to multitask (e.g., work at computer and answer simple question) functional tasks naturally with modified indpendence x 3 sessions    Baseline  06-11-19    Time  1    Period  Weeks    Status  On-going       Plan - 06/27/19 1641    Clinical Impression Statement  Pt cont to present with mild cognitive communication deficit but wife/pt believes steadily improving.. Memory is improving both spontaneously and with use of compensations. See "skilled intervention" for details of today's session.Pt decr'd to once/~4 weeks due to  progress with possible d/c next session. I recommend cont'd skilled ST to address cognitive deficits in order to improve pt's safety, increase independence, and for regaining skills/training compensations for possible return to work.    Speech Therapy Frequency  1x /week    Duration  --   ~ every 4 weeks   Treatment/Interventions  Environmental controls;Cueing hierarchy;SLP instruction and feedback;Cognitive reorganization;Compensatory techniques;Functional tasks;Compensatory strategies;Internal/external aids;Patient/family education    Potential to Achieve Goals  Good       Patient will benefit from skilled therapeutic intervention in order to improve the following deficits and impairments:   Cognitive communication deficit    Problem List Patient Active Problem List   Diagnosis Date Noted  . Moderate hypoxic-ischemic encephalopathy   . CVA (cerebral vascular accident) ([place]) 04/09/2019  . Cellulitis 04/09/2019  . Superficial venous thrombosis of arm, right 04/09/2019  . Edema of right upper arm 04/09/2019  . Substance abuse ([place]) 04/09/2019  . Encephalopathy 04/09/2019  . Anal fissure 04/15/2015  . Hypogonadism in male 04/13/2015  . Chest pain 04/13/2015  . Snoring 04/13/2015  . Fatigue 04/13/2015  . Dyslipidemia 04/13/2015  . Insomnia 07/17/2014  . Anxiety 09/08/2013  . Hyperlipidemia 08/20/2013  . Elevated LFTs 08/20/2013  . Exertional chest pain 08/20/2013    [facility] ,MS, CCC-SLP  06/27/2019, 4:44 PM  [facility] [address] [place], [place], [address] Phone: [phone]   Fax:  [phone]   Name: Brandon Thomas MRN: [id number] Date of Birth: [birth year]

## 2019-07-04 ENCOUNTER — Ambulatory Visit: Payer: 59

## 2019-07-23 ENCOUNTER — Encounter: Payer: Self-pay | Admitting: Physical Medicine & Rehabilitation

## 2019-07-23 ENCOUNTER — Other Ambulatory Visit: Payer: Self-pay

## 2019-07-23 ENCOUNTER — Encounter: Payer: 59 | Attending: Psychology | Admitting: Physical Medicine & Rehabilitation

## 2019-07-23 DIAGNOSIS — G9340 Encephalopathy, unspecified: Secondary | ICD-10-CM | POA: Insufficient documentation

## 2019-07-23 DIAGNOSIS — F1910 Other psychoactive substance abuse, uncomplicated: Secondary | ICD-10-CM | POA: Diagnosis present

## 2019-07-23 NOTE — Patient Instructions (Signed)
PLEASE FEEL FREE TO CALL OUR OFFICE WITH ANY PROBLEMS OR QUESTIONS ([phone])      

## 2019-07-23 NOTE — Progress Notes (Signed)
Subjective:    Patient ID: Brandon Thomas, male    DOB: [birth year], 44 y.o.   MRN: [id number]  HPI   Brandon Thomas is back regarding his anoxic brain injury and associated deficits. He feels that he's doing better. He attributes a lot of that to work. He does notice some shor term memory deficits. He is keeping more notes and a calendar to stay up with his work, schedule, travel. He is working a few hours per day. He is traveling a little bit most recently he flew to [facility]  He goes to the [facility] for group therapies 3 days per week. He goes to [employer] on Fridays.   His mood is positive. Family is supportive. He denies depression. Anxiety is better as he's not letting work pile up to the point where it is overwhelming for him. He feels that the zoloft makes his jaw a bit tight however.   He's driving without issue. He hasn't driven at night.      Pain Inventory Average Pain 0 Pain Right Now 0 My pain is no pain  In the last 24 hours, has pain interfered with the following? General activity 0 Relation with others 0 Enjoyment of life 0 What TIME of day is your pain at its worst? no pain Sleep (in general) Good  Pain is worse with: no pain Pain improves with: no pain Relief from Meds: no pain  Mobility walk without assistance ability to climb steps?  yes do you drive?  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  no  Physicians involved in your care Any changes since last visit?  no   Family History  Adopted: Yes   Social History   Socioeconomic History  . Marital status: Married    Spouse name: Not on file  . Number of children: 2  . Years of education: Not on file  . Highest education level: Not on file  Occupational History  . Occupation: Press photographer  Tobacco Use  . Smoking status: Never Smoker  . Smokeless tobacco: Current User    Types: Chew  Substance and  Sexual Activity  . Alcohol use: Yes    Alcohol/week: 1.0 standard drinks    Types: 1 Standard drinks or equivalent per week  . Drug use: No  . Sexual activity: Yes    Partners: Female  Other Topics Concern  . Not on file  Social History Narrative   Two children ages 25 and 18.  Happily married x 9 years.     Social Determinants of Health   Financial Resource Strain:   . Difficulty of Paying Living Expenses:   Food Insecurity:   . Worried About Charity fundraiser in the Last Year:   . Arboriculturist in the Last Year:   Transportation Needs:   . Film/video editor (Medical):   Marland Kitchen Lack of Transportation (Non-Medical):   Physical Activity:   . Days of Exercise per Week:   . Minutes of Exercise per Session:   Stress:   . Feeling of Stress :   Social Connections:   . Frequency of Communication with Friends and Family:   . Frequency of Social Gatherings with Friends and Family:   . Attends Religious Services:   . Active Member of Clubs or Organizations:   . Attends Archivist Meetings:   Marland Kitchen Marital Status:  Past Surgical History:  Procedure Laterality Date  . WISDOM TOOTH EXTRACTION     Past Medical History:  Diagnosis Date  . Depression    Well treated without meds currently  . Elevated LFTs 08/20/2013  . Hyperlipidemia 08/20/2013   Temp (!) 97.5 F (36.4 C)   Ht 5' 11"  (1.803 m)   Wt 211 lb (95.7 kg)   BMI 29.43 kg/m   Opioid Risk Score:   Fall Risk Score:  `1  Depression screen PHQ 2/9  No flowsheet data found.  Review of Systems  Constitutional: Negative.   HENT: Negative.   Eyes: Negative.   Respiratory: Negative.   Cardiovascular: Negative.   Gastrointestinal: Negative.   Endocrine: Negative.   Musculoskeletal: Negative.   Skin: Negative.   Allergic/Immunologic: Negative.   Neurological: Positive for numbness.  Hematological: Negative.   Psychiatric/Behavioral: Negative.   All other systems reviewed and are negative.       Objective:   Physical Exam General: No acute distress HEENT: EOMI, oral membranes moist Cards: reg rate  Chest: normal effort Abdomen: Soft, NT, ND Skin: dry, intact Extremities: no edema  Neurological: He is alert. a O x 3. Normal insight and awareness. Occasional deficits with recall but very mild. Motor and sensory normal. Pysch: pleasant and appropriate.         Assessment & Plan:  1.  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           -continue with the use of schedule, calendar, keep routine/notes to maximize information retention for work. 2. Mood: Overall mood is improving.               -Continue Zoloft 100 mg daily             -needs to maintain social supports, support groups  -f/u with Dr. Sima Matas as planned 3.  Driving: he may drive day/night. Needs to stay local 4.  Maintain work/life balance  -may increase work as tolerated 5. Narcotic abuse.   -expressed the importance of maintaining his group sessions  -needs to keep positive influences around him  -maintain work life balance   Fifteen minutes of face to face patient care time were spent during this visit. All questions were encouraged and answered.  Follow up with me in 4 mos .

## 2019-07-25 ENCOUNTER — Ambulatory Visit: Payer: 59 | Attending: Physical Medicine and Rehabilitation

## 2019-07-25 ENCOUNTER — Other Ambulatory Visit: Payer: Self-pay

## 2019-07-25 DIAGNOSIS — R41841 Cognitive communication deficit: Secondary | ICD-10-CM | POA: Diagnosis not present

## 2019-07-25 NOTE — Therapy (Signed)
[facility] [address] [place], [place], [address] Phone: [phone]   Fax:  [phone]  Speech Language Pathology Treatment/Discharge summary  Patient Details  Name: Brandon Thomas MRN: [id number] Date of Birth: February 16, 1976 Referring Provider (SLP): Reesa Chew, [place]   Encounter Date: 07/25/2019  End of Session - 07/25/19 1515    Visit Number  13    Number of Visits  17    Date for SLP Re-Evaluation  08/01/19    Authorization Type  BCBS, 20 visit limit    Authorization - Visit Number  12    Authorization - Number of Visits  20    SLP Start Time  4193    SLP Stop Time   1440    SLP Time Calculation (min)  36 min    Activity Tolerance  Patient tolerated treatment well       Past Medical History:  Diagnosis Date  . Depression    Well treated without meds currently  . Elevated LFTs 08/20/2013  . Hyperlipidemia 08/20/2013    Past Surgical History:  Procedure Laterality Date  . WISDOM TOOTH EXTRACTION      There were no vitals filed for this visit.  Subjective Assessment - 07/25/19 1411    Subjective  Pt has had four contracts come in this week (unprecendented) and was frustrated that he couldn't do things easier and quicker.    Currently in Pain?  No/denies            ADULT SLP TREATMENT - 07/25/19 1423      General Information   Behavior/Cognition  Alert;Cooperative;Pleasant mood      Treatment Provided   Treatment provided  Cognitive-Linquistic      Cognitive-Linquistic Treatment   Treatment focused on  Cognition    Skilled Treatment  SLP questioned pt re: current efficacy compensations for attention, delegation, organization. Pt is using a functional and successful memory and orgainzational system for work - used the system with a call to his boss. Pt is using many of the same systems at home with just as much success. While pt was inputting Dr. Sima Matas appointment on his phone he  answered SLP question about min complex question. Pt agrees he is ready for d/c at this time.       Assessment / Recommendations / Plan   Plan  Discharge SLP treatment due to (comment)      Progression Toward Goals   Progression toward goals  Progressing toward goals       SLP Education - 07/25/19 1514    Education Details  Dr. Sima Matas is the one to talk to about how he feels about "tht new normal"    Person(s) Educated  Patient    Methods  Explanation    Comprehension  Verbalized understanding       SLP Short Term Goals - 05/19/19 1508      SLP SHORT TERM GOAL #1   Title  Patient will use compensatory aids (memory notebook, phone) to plan and recall daily schedule, chores, appointments or activities with min A x3 visits.    Baseline  05-07-19, 05-14-19 05-19-19    Status  Achieved      SLP SHORT TERM GOAL #2   Title  pt will demo ability to switch between functional tasks naturally with modified independence (compensations, timers, alerts) x 3 sessions    Baseline  05-14-19, 05-19-19    Status  Partially Met      SLP SHORT TERM  GOAL #3   Title  pt will demonstrate ability to recall information at least 10 minutes later in ST sessions with modified independence (compensations) 95%    Baseline  05-07-19    Status  Partially Met       SLP Long Term Goals - 07/25/19 1430      SLP LONG TERM GOAL #1   Title  pt will utilize memory system appropriately during 6 ST sessions    Baseline  05-19-19, 05-21-19, 05-28-19, 06-04-19, 06-11-19, 06-27-19    Status  Achieved      SLP LONG TERM GOAL #2   Title  pt will demo ability to multitask (e.g., work at computer and answer simple question) functional tasks naturally with modified indpendence x 3 sessions    Baseline  06-11-19, 07-25-19    Status  Partially Met       Plan - 07/25/19 1516    Clinical Impression Statement  Pt cont to present with mild cognitive communication deficit which is steadily improving. Memory is improving both  spontaneously and with use of compensations. See "skilled intervention" for details of today's session.Pt decr'd to once/~4 weeks due to progress with possible d/c next session. I recommend cont'd skilled ST to address cognitive deficits in order to improve pt's safety, increase independence, and for regaining skills/training compensations for possible return to work.    Treatment/Interventions  Environmental controls;Cueing hierarchy;SLP instruction and feedback;Cognitive reorganization;Compensatory techniques;Functional tasks;Compensatory strategies;Internal/external aids;Patient/family education    Potential to Achieve Goals  Good       Patient will benefit from skilled therapeutic intervention in order to improve the following deficits and impairments:   Cognitive communication deficit   SPEECH THERAPY DISCHARGE SUMMARY  Visits from Start of Care: 13  Current functional level related to goals / functional outcomes: Pt goals are above for perusal. Pt is back to work using compensations for memory at greater intensity than pre-hospitalization. Compensations for memory at home are going well too. He is driving and was just cleared to drive at night as well as during the daytime.  Pt reported frustration with "the new normal". SLP referred him to Dr. Sima Matas and encouraged him to keep good social supports and his counseling and support groups.   Remaining deficits: Mild memory deficits.   Education / Equipment: Compensations for cognitive linguistic deficits   Plan: Patient agrees to discharge.  Patient goals were not met. Patient is being discharged due to meeting the stated rehab goals.  ?????       Problem List Patient Active Problem List   Diagnosis Date Noted  . Moderate hypoxic-ischemic encephalopathy   . CVA (cerebral vascular accident) ([place]) 04/09/2019  . Cellulitis 04/09/2019  . Superficial venous thrombosis of arm, right 04/09/2019  . Edema of right upper arm  04/09/2019  . Substance abuse ([place]) 04/09/2019  . Encephalopathy 04/09/2019  . Anal fissure 04/15/2015  . Hypogonadism in male 04/13/2015  . Chest pain 04/13/2015  . Snoring 04/13/2015  . Fatigue 04/13/2015  . Dyslipidemia 04/13/2015  . Insomnia 07/17/2014  . Anxiety 09/08/2013  . Hyperlipidemia 08/20/2013  . Elevated LFTs 08/20/2013  . Exertional chest pain 08/20/2013    [facility] ,[place], CCC-SLP  07/25/2019, 3:19 PM  [facility] [address] [place], [place], [address] Phone: [phone]   Fax:  [phone]   Name: Brandon Thomas MRN: [id number] Date of Birth: 1975/10/16

## 2019-07-25 NOTE — Patient Instructions (Signed)
   Talk to Dr. Sima Matas if you feel like you need to talk with someone about the frustration about your "new normal" as you called it today.

## 2019-09-09 ENCOUNTER — Other Ambulatory Visit: Payer: Self-pay

## 2019-09-09 ENCOUNTER — Encounter: Payer: 59 | Attending: Psychology | Admitting: Psychology

## 2019-09-09 ENCOUNTER — Encounter: Payer: Self-pay | Admitting: Psychology

## 2019-09-09 DIAGNOSIS — F1910 Other psychoactive substance abuse, uncomplicated: Secondary | ICD-10-CM

## 2019-09-09 DIAGNOSIS — G9340 Encephalopathy, unspecified: Secondary | ICD-10-CM | POA: Insufficient documentation

## 2019-09-09 NOTE — Progress Notes (Signed)
Neuropsychological Consultation   Patient:   Brandon Thomas   DOB:   [birth year]  MR Number:  [id number]  Location:  [facility] [place], [place] [id number] [address] [place] [address] Dept: [phone]           Date of Service:   09/09/2019  Start Time:   9 AM End Time:   10 AM  Today's visit was a 1 hour long appointment that was conducted in person in my outpatient clinic office.  The patient and his wife are present for this visit.  Provider/Observer:  Ilean Skill, Psy.D.       Clinical Neuropsychologist       Billing Code/Service: 62952  Chief Complaint:    Brandon Thomas is a 44 year old male with a history of depression, opioid abuse and previous drug treatment/rehab, and hypogonadism.  The patient was admitted to the hospital on 04/03/2019 after being found by his wife unresponsive and gurgling sounds and CPR was initiated.  He did receive Narcan without response and had decerebrate posturing and concerns of seizure.  Urine drug screen was positive for benzo diazepam but not positive for opiates although the patient admits to using opiates immediately before losing consciousness.  Specific opiate testing for fentanyl was not conducted and the patient has acknowledged that he was using fentanyl at the time of his overdose.  The patient was intubated for airway protection and needed IVF for rhabdo and antibiotics for sepsis felt due to aspiration pneumonia and/or possible right upper extremity cellulitis.  EEG revealed severe diffuse encephalopathy.  MRI brain done revealed restricted diffusion throughout the hippocampi likely due to acute injury from toxic exposure and/or hypoxic event.  The patient continued to have significant short-term memory deficits and problem-solving but his perseverative speech and orientation have significantly improved during his  comprehensive inpatient rehabilitation stay.  The patient continues to complain about his right arm being swollen and having strange sensory sensations with his right arm and hand.  Reason for Service:  Brandon Thomas is a 43 year old male with a history of depression, opioid abuse and previous drug treatment/rehab, and hypogonadism.  The patient was admitted to the hospital on 04/03/2019 after being found by his wife unresponsive and gurgling sounds and CPR was initiated.  He did receive Narcan without response and had decerebrate posturing and concerns of seizure.  Urine drug screen was positive for benzo diazepam but not positive for opiates although the patient admits to using opiates immediately before losing consciousness.  Specific opiate testing for fentanyl was not conducted and the patient has acknowledged that he was using fentanyl at the time of his overdose.  The patient was intubated for airway protection and needed IVF for rhabdo and antibiotics for sepsis felt due to aspiration pneumonia and/or possible right upper extremity cellulitis.  EEG revealed severe diffuse encephalopathy.  MRI brain done revealed restricted diffusion throughout the hippocampi likely due to acute injury from toxic exposure and/or hypoxic event.  The patient continued to have significant short-term memory deficits and problem-solving but his perseverative speech and orientation have significantly improved during his comprehensive inpatient rehabilitation stay.  The patient continues to complain about his right arm being swollen and having strange sensory sensations with his right arm and hand.  The patient has now been discharged from the comprehensive inpatient rehabilitation program and is now back home.  The patient has made significant recovery in overall functioning  but continues to have short-term memory difficulties that while they are improving persist.  The patient is difficulties remembering small tasks  and questions or comments from others but has been able to learn new information and is continuing with rehabilitative efforts.  The patient reports that he has been able to abstain from any substance abuse denies any recent cravings or other features related to his substance abuse history.  The patient reports that while he had had previous long term substance abuse he had gone through rehabilitative efforts and had not been using up until the day that he OD.  The patient reports that this was a singular event and had not been preceded by days of substance use.  The patient is continue to take sertraline as well as a baby aspirin.  The patient reports that he is sleeping normally and reports that he has had a significant increase in appetite recently.  09/08/2019: The patient has returned to work and reports that he has been successfully doing his job but it is very stressful for him and he is having to make lots of notes and reminders on his phone to keep up with his job requirements.  He has been doing some traveling.  The patient denies any substance abuse or use and has continued to participate in intensive outpatient therapies for his substance abuse history.  The patient has not been going to NA meetings as has been requested.  The patient's wife is very stressed about his risk of vulnerability to relapse and this causes a lot of stress within the relationship.  The patient describes being pushed to his limits with his job and while he is not made any significant mistakes and his employer is happy with his work and he has been doing well as far as production at work he is constantly feeling stressed which was also there prior to his anoxic brain injury.  Current Status:  The patient describes ongoing short-term memory deficits but reports that most other cognitive functioning are within normal limits.  Reliability of Information: The information is derived from 1 hour face-to-face clinical interview as  well as review of available medical records.  Behavioral Observation: Brandon Thomas  presents as a 44 y.o.-year-old Right Caucasian Male who appeared his stated age. his dress was Appropriate and he was Well Groomed and his manners were Appropriate to the situation.  his participation was indicative of Appropriate and Redirectable behaviors.  There were not any physical disabilities noted.  he displayed an appropriate level of cooperation and motivation.     Interactions:    Active Appropriate and Redirectable  Attention:   abnormal and attention span appeared shorter than expected for age  Memory:   abnormal; remote memory intact, recent memory impaired  Visuo-spatial:  not examined  Speech (Volume):  normal  Speech:   normal; normal  Thought Process:  Coherent and Relevant  Though Content:  WNL; not suicidal and not homicidal  Orientation:   person, place, time/date and situation  Judgment:   Fair  Planning:   Fair  Affect:    Appropriate  Mood:    Dysphoric  Insight:   Good  Intelligence:   high  Marital Status/Living: The patient was born in [place] and has 2 siblings.  He is married and they have 2 children.  He has been married for the past 12 years and has no previous marriages.  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  Substance Use:  There is a documented history of prescription drug and Opiate-based medications abuse confirmed by the patient.    Education:   Engineering geologist History:   Past Medical History:  Diagnosis Date  . Depression    Well treated without meds currently  . Elevated LFTs 08/20/2013  . Hyperlipidemia 08/20/2013        Psychiatric History:  The patient does have a history of depression and is continuing to take sertraline.  He also has a past history of substance abuse with inpatient rehabilitative  admissions.  Family Med/Psych History:  Family History  Adopted: Yes    Impression/DX:  Brandon Thomas is a 44 year old male with a history of depression, opioid abuse and previous drug treatment/rehab, and hypogonadism.  The patient was admitted to the hospital on 04/03/2019 after being found by his wife unresponsive and gurgling sounds and CPR was initiated.  He did receive Narcan without response and had decerebrate posturing and concerns of seizure.  Urine drug screen was positive for benzo diazepam but not positive for opiates although the patient admits to using opiates immediately before losing consciousness.  Specific opiate testing for fentanyl was not conducted and the patient has acknowledged that he was using fentanyl at the time of his overdose.  The patient was intubated for airway protection and needed IVF for rhabdo and antibiotics for sepsis felt due to aspiration pneumonia and/or possible right upper extremity cellulitis.  EEG revealed severe diffuse encephalopathy.  MRI brain done revealed restricted diffusion throughout the hippocampi likely due to acute injury from toxic exposure and/or hypoxic event.  The patient continued to have significant short-term memory deficits and problem-solving but his perseverative speech and orientation have significantly improved during his comprehensive inpatient rehabilitation stay.  The patient continues to complain about his right arm being swollen and having strange sensory sensations with his right arm and hand.  The patient describes ongoing short-term memory deficits but reports that most other cognitive functioning are within normal limits.  09/08/2019: The patient has returned to work and reports that he has been successfully doing his job but it is very stressful for him and he is having to make lots of notes and reminders on his phone to keep up with his job requirements.  He has been doing some traveling.  The patient denies any  substance abuse or use and has continued to participate in intensive outpatient therapies for his substance abuse history.  The patient has not been going to NA meetings as has been requested.  The patient's wife is very stressed about his risk of vulnerability to relapse and this causes a lot of stress within the relationship.  The patient describes being pushed to his limits with his job and while he is not made any significant mistakes and his employer is happy with his work and he has been doing well as far as production at work he is constantly feeling stressed which was also there prior to his anoxic brain injury.    Disposition/Plan:  We have initiated a therapeutic interventions around his management and coping of his residual cognitive deficits in relationship to his substance abuse history.  We have set up a strategy for the patient to not only continue his IOP visits but also go to NA meetings on a much more frequent basis for 1 month to see if that is beneficial to him.  Diagnosis:  Moderate hypoxic-ischemic encephalopathy  Substance abuse ([place])         Electronically Signed   _______________________ Ilean Skill, Psy.D.

## 2019-09-30 ENCOUNTER — Other Ambulatory Visit: Payer: Self-pay

## 2019-09-30 ENCOUNTER — Encounter: Payer: 59 | Admitting: Psychology

## 2019-09-30 ENCOUNTER — Encounter: Payer: Self-pay | Admitting: Psychology

## 2019-09-30 DIAGNOSIS — F419 Anxiety disorder, unspecified: Secondary | ICD-10-CM

## 2019-09-30 DIAGNOSIS — G9340 Encephalopathy, unspecified: Secondary | ICD-10-CM | POA: Diagnosis not present

## 2019-09-30 DIAGNOSIS — F1910 Other psychoactive substance abuse, uncomplicated: Secondary | ICD-10-CM | POA: Diagnosis not present

## 2019-09-30 NOTE — Progress Notes (Signed)
Neuropsychological Consultation   Patient:   Brandon Thomas   DOB:   [birth year]  MR Number:  [id number]  Location:  [facility] [place], [place] [id number] [address] [place] [address] Dept: [phone]           Date of Service:   09/30/2019  Start Time:   10 AM End Time:   11 AM  Today's visit was a 1 hour long appointment that was conducted in person in my outpatient clinic office.  The patient and his wife are present for this visit.  Provider/Observer:  Ilean Skill, Psy.D.       Clinical Neuropsychologist       Billing Code/Service: [id number]  Chief Complaint:    Brandon Thomas. Krider is a 44 year old male with a history of depression, opioid abuse and previous drug treatment/rehab, and hypogonadism.  The patient was admitted to the hospital on 04/03/2019 after being found by his wife unresponsive and gurgling sounds and CPR was initiated.  He did receive Narcan without response and had decerebrate posturing and concerns of seizure.  Urine drug screen was positive for benzo diazepam but not positive for opiates although the patient admits to using opiates immediately before losing consciousness.  Specific opiate testing for fentanyl was not conducted and the patient has acknowledged that he was using fentanyl at the time of his overdose.  The patient was intubated for airway protection and needed IVF for rhabdo and antibiotics for sepsis felt due to aspiration pneumonia and/or possible right upper extremity cellulitis.  EEG revealed severe diffuse encephalopathy.  MRI brain done revealed restricted diffusion throughout the hippocampi likely due to acute injury from toxic exposure and/or hypoxic event.  The patient continued to have significant short-term memory deficits and problem-solving but his perseverative speech and orientation have significantly improved during his  comprehensive inpatient rehabilitation stay.  The patient continues to complain about his right arm being swollen and having strange sensory sensations with his right arm and hand.  Reason for Service:  Brandon Thomas is a 44 year old male with a history of depression, opioid abuse and previous drug treatment/rehab, and hypogonadism.  The patient was admitted to the hospital on 04/03/2019 after being found by his wife unresponsive and gurgling sounds and CPR was initiated.  He did receive Narcan without response and had decerebrate posturing and concerns of seizure.  Urine drug screen was positive for benzo diazepam but not positive for opiates although the patient admits to using opiates immediately before losing consciousness.  Specific opiate testing for fentanyl was not conducted and the patient has acknowledged that he was using fentanyl at the time of his overdose.  The patient was intubated for airway protection and needed IVF for rhabdo and antibiotics for sepsis felt due to aspiration pneumonia and/or possible right upper extremity cellulitis.  EEG revealed severe diffuse encephalopathy.  MRI brain done revealed restricted diffusion throughout the hippocampi likely due to acute injury from toxic exposure and/or hypoxic event.  The patient continued to have significant short-term memory deficits and problem-solving but his perseverative speech and orientation have significantly improved during his comprehensive inpatient rehabilitation stay.  The patient continues to complain about his right arm being swollen and having strange sensory sensations with his right arm and hand.  The patient has now been discharged from the comprehensive inpatient rehabilitation program and is now back home.  The patient has made significant recovery in overall functioning  but continues to have short-term memory difficulties that while they are improving persist.  The patient is difficulties remembering small tasks  and questions or comments from others but has been able to learn new information and is continuing with rehabilitative efforts.  The patient reports that he has been able to abstain from any substance abuse denies any recent cravings or other features related to his substance abuse history.  The patient reports that while he had had previous long term substance abuse he had gone through rehabilitative efforts and had not been using up until the day that he OD.  The patient reports that this was a singular event and had not been preceded by days of substance use.  The patient is continue to take sertraline as well as a baby aspirin.  The patient reports that he is sleeping normally and reports that he has had a significant increase in appetite recently.  09/08/2019: The patient has returned to work and reports that he has been successfully doing his job but it is very stressful for him and he is having to make lots of notes and reminders on his phone to keep up with his job requirements.  He has been doing some traveling.  The patient denies any substance abuse or use and has continued to participate in intensive outpatient therapies for his substance abuse history.  The patient has not been going to NA meetings as has been requested.  The patient's wife is very stressed about his risk of vulnerability to relapse and this causes a lot of stress within the relationship.  The patient describes being pushed to his limits with his job and while he is not made any significant mistakes and his employer is happy with his work and he has been doing well as far as production at work he is constantly feeling stressed which was also there prior to his anoxic brain injury.  Current Status:  The patient describes ongoing short-term memory deficits but reports that most other cognitive functioning are within normal limits.  Reliability of Information: The information is derived from 1 hour face-to-face clinical interview as  well as review of available medical records.  Behavioral Observation: IVEY CINA  presents as a 44 y.o.-year-old Right Caucasian Male who appeared his stated age. his dress was Appropriate and he was Well Groomed and his manners were Appropriate to the situation.  his participation was indicative of Appropriate and Redirectable behaviors.  There were not any physical disabilities noted.  he displayed an appropriate level of cooperation and motivation.     Interactions:    Active Appropriate and Redirectable  Attention:   abnormal and attention span appeared shorter than expected for age  Memory:   abnormal; remote memory intact, recent memory impaired  Visuo-spatial:  not examined  Speech (Volume):  normal  Speech:   normal; normal  Thought Process:  Coherent and Relevant  Though Content:  WNL; not suicidal and not homicidal  Orientation:   person, place, time/date and situation  Judgment:   Fair  Planning:   Fair  Affect:    Appropriate  Mood:    Dysphoric  Insight:   Good  Intelligence:   high  Marital Status/Living: The patient was born in [place] and has 2 siblings.  He is married and they have 2 children.  He has been married for the past 12 years and has no previous marriages.  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  Substance Use:  There is a documented history of prescription drug and Opiate-based medications abuse confirmed by the patient.    Education:   Engineering geologist History:   Past Medical History:  Diagnosis Date  . Depression    Well treated without meds currently  . Elevated LFTs 08/20/2013  . Hyperlipidemia 08/20/2013        Psychiatric History:  The patient does have a history of depression and is continuing to take sertraline.  He also has a past history of substance abuse with inpatient rehabilitative  admissions.  Family Med/Psych History:  Family History  Adopted: Yes    Impression/DX:  Algernon L. Blunck is a 44 year old male with a history of depression, opioid abuse and previous drug treatment/rehab, and hypogonadism.  The patient was admitted to the hospital on 04/03/2019 after being found by his wife unresponsive and gurgling sounds and CPR was initiated.  He did receive Narcan without response and had decerebrate posturing and concerns of seizure.  Urine drug screen was positive for benzo diazepam but not positive for opiates although the patient admits to using opiates immediately before losing consciousness.  Specific opiate testing for fentanyl was not conducted and the patient has acknowledged that he was using fentanyl at the time of his overdose.  The patient was intubated for airway protection and needed IVF for rhabdo and antibiotics for sepsis felt due to aspiration pneumonia and/or possible right upper extremity cellulitis.  EEG revealed severe diffuse encephalopathy.  MRI brain done revealed restricted diffusion throughout the hippocampi likely due to acute injury from toxic exposure and/or hypoxic event.  The patient continued to have significant short-term memory deficits and problem-solving but his perseverative speech and orientation have significantly improved during his comprehensive inpatient rehabilitation stay.  The patient continues to complain about his right arm being swollen and having strange sensory sensations with his right arm and hand.  The patient describes ongoing short-term memory deficits but reports that most other cognitive functioning are within normal limits.  09/08/2019: The patient has returned to work and reports that he has been successfully doing his job but it is very stressful for him and he is having to make lots of notes and reminders on his phone to keep up with his job requirements.  He has been doing some traveling.  The patient denies any  substance abuse or use and has continued to participate in intensive outpatient therapies for his substance abuse history.  The patient has not been going to NA meetings as has been requested.  The patient's wife is very stressed about his risk of vulnerability to relapse and this causes a lot of stress within the relationship.  The patient describes being pushed to his limits with his job and while he is not made any significant mistakes and his employer is happy with his work and he has been doing well as far as production at work he is constantly feeling stressed which was also there prior to his anoxic brain injury.  09/30/2019:  Patient coping better with work travel but is still a stress for him.  Patient staying clean and doing well at work with extra note taking and reminders.  Disposition/Plan:  We have initiated a therapeutic interventions around his management and coping of his residual cognitive deficits in relationship to his substance abuse history.  We have set up a strategy for the patient to not only continue his  IOP visits but also go to NA meetings on a much more frequent basis for 1 month to see if that is beneficial to him.  Diagnosis:    Moderate hypoxic-ischemic encephalopathy  Substance abuse ([place])  Anxiety         Electronically Signed   _______________________ Ilean Skill, Psy.D.

## 2019-10-14 ENCOUNTER — Encounter: Payer: 59 | Attending: Psychology | Admitting: Psychology

## 2019-10-14 ENCOUNTER — Other Ambulatory Visit: Payer: Self-pay

## 2019-10-14 DIAGNOSIS — G9340 Encephalopathy, unspecified: Secondary | ICD-10-CM | POA: Insufficient documentation

## 2019-10-14 DIAGNOSIS — F419 Anxiety disorder, unspecified: Secondary | ICD-10-CM | POA: Diagnosis not present

## 2019-10-14 DIAGNOSIS — F1910 Other psychoactive substance abuse, uncomplicated: Secondary | ICD-10-CM | POA: Insufficient documentation

## 2019-10-22 ENCOUNTER — Encounter: Payer: Self-pay | Admitting: Psychology

## 2019-10-22 NOTE — Progress Notes (Signed)
Neuropsychological Consultation   Patient:   Brandon Thomas   DOB:   Aug 27, 1975  MR Number:  [id number]  Location:  [facility] [place], [place] [id number] [facility] [place] [address] Dept: [phone]           Date of Service:   10/14/2019  Start Time:   11 AM End Time:   12 PM  Today's visit was a 1 hour long appointment conducted in person in my outpatient clinic office.  The patient and myself were present for this visit.  Provider/Observer:  Ilean Skill, Psy.D.       Clinical Neuropsychologist       Billing Code/Service: 96158/[id number]  Chief Complaint:    Brandon Thomas is a 44 year old male with a history of depression, opioid abuse and previous drug treatment/rehab, and hypogonadism.  The patient was admitted to the hospital on 04/03/2019 after being found by his wife unresponsive and gurgling sounds and CPR was initiated.  He did receive Narcan without response and had decerebrate posturing and concerns of seizure.  Urine drug screen was positive for benzo diazepam but not positive for opiates although the patient admits to using opiates immediately before losing consciousness.  Specific opiate testing for fentanyl was not conducted and the patient has acknowledged that he was using fentanyl at the time of his overdose.  The patient was intubated for airway protection and needed IVF for rhabdo and antibiotics for sepsis felt due to aspiration pneumonia and/or possible right upper extremity cellulitis.  EEG revealed severe diffuse encephalopathy.  MRI brain done revealed restricted diffusion throughout the hippocampi likely due to acute injury from toxic exposure and/or hypoxic event.  The patient continued to have significant short-term memory deficits and problem-solving but his perseverative speech and orientation have significantly improved during his  comprehensive inpatient rehabilitation stay.  The patient continues to complain about his right arm being swollen and having strange sensory sensations with his right arm and hand.  Reason for Service:  Brandon Thomas is a 44 year old male with a history of depression, opioid abuse and previous drug treatment/rehab, and hypogonadism.  The patient was admitted to the hospital on 04/03/2019 after being found by his wife unresponsive and gurgling sounds and CPR was initiated.  He did receive Narcan without response and had decerebrate posturing and concerns of seizure.  Urine drug screen was positive for benzo diazepam but not positive for opiates although the patient admits to using opiates immediately before losing consciousness.  Specific opiate testing for fentanyl was not conducted and the patient has acknowledged that he was using fentanyl at the time of his overdose.  The patient was intubated for airway protection and needed IVF for rhabdo and antibiotics for sepsis felt due to aspiration pneumonia and/or possible right upper extremity cellulitis.  EEG revealed severe diffuse encephalopathy.  MRI brain done revealed restricted diffusion throughout the hippocampi likely due to acute injury from toxic exposure and/or hypoxic event.  The patient continued to have significant short-term memory deficits and problem-solving but his perseverative speech and orientation have significantly improved during his comprehensive inpatient rehabilitation stay.  The patient continues to complain about his right arm being swollen and having strange sensory sensations with his right arm and hand.  The patient has now been discharged from the comprehensive inpatient rehabilitation program and is now back home.  The patient has made significant recovery in overall functioning but continues to  have short-term memory difficulties that while they are improving persist.  The patient is difficulties remembering small tasks  and questions or comments from others but has been able to learn new information and is continuing with rehabilitative efforts.  The patient reports that he has been able to abstain from any substance abuse denies any recent cravings or other features related to his substance abuse history.  The patient reports that while he had had previous long term substance abuse he had gone through rehabilitative efforts and had not been using up until the day that he OD.  The patient reports that this was a singular event and had not been preceded by days of substance use.  The patient is continue to take sertraline as well as a baby aspirin.  The patient reports that he is sleeping normally and reports that he has had a significant increase in appetite recently.  09/08/2019: The patient has returned to work and reports that he has been successfully doing his job but it is very stressful for him and he is having to make lots of notes and reminders on his phone to keep up with his job requirements.  He has been doing some traveling.  The patient denies any substance abuse or use and has continued to participate in intensive outpatient therapies for his substance abuse history.  The patient has not been going to NA meetings as has been requested.  The patient's wife is very stressed about his risk of vulnerability to relapse and this causes a lot of stress within the relationship.  The patient describes being pushed to his limits with his job and while he is not made any significant mistakes and his employer is happy with his work and he has been doing well as far as production at work he is constantly feeling stressed which was also there prior to his anoxic brain injury.  10/14/2019: The patient is continued to work successfully and while he has to manage his stress at work particularly when traveling and use extensive notes and reminders he has successfully completing his workplace expectations.  The patient denies any  ongoing substance abuse or use and he has continued to participate and substance abuse recovery efforts.  The patient reports continued stressors with regard to his recovery and issues related to social isolation when either he is traveling or his family is away and he is at home alone continue to be problematic and major stressors for him.  Current Status:  The patient describes ongoing short-term memory deficits but reports that most other cognitive functioning are within normal limits.  Reliability of Information: The information is derived from 1 hour face-to-face clinical interview as well as review of available medical records.  Behavioral Observation: Brandon Thomas  presents as a 44 y.o.-year-old Right Caucasian Male who appeared his stated age. his dress was Appropriate and he was Well Groomed and his manners were Appropriate to the situation.  his participation was indicative of Appropriate and Redirectable behaviors.  There were not any physical disabilities noted.  he displayed an appropriate level of cooperation and motivation.     Interactions:    Active Appropriate and Redirectable  Attention:   abnormal and attention span appeared shorter than expected for age  Memory:   abnormal; remote memory intact, recent memory impaired  Visuo-spatial:  not examined  Speech (Volume):  normal  Speech:   normal; normal  Thought Process:  Coherent and Relevant  Though Content:  WNL; not suicidal and  not homicidal  Orientation:   person, place, time/date and situation  Judgment:   Fair  Planning:   Fair  Affect:    Appropriate  Mood:    Dysphoric  Insight:   Good  Intelligence:   high  Marital Status/Living: The patient was born in [place] and has 2 siblings.  He is married and they have 2 children.  He has been married for the past 12 years and has no previous marriages.  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  Substance Use:  There is a documented history of prescription drug and Opiate-based medications abuse confirmed by the patient.    Education:   Engineering geologist History:   Past Medical History:  Diagnosis Date  . Depression    Well treated without meds currently  . Elevated LFTs 08/20/2013  . Hyperlipidemia 08/20/2013        Psychiatric History:  The patient does have a history of depression and is continuing to take sertraline.  He also has a past history of substance abuse with inpatient rehabilitative admissions.  Family Med/Psych History:  Family History  Adopted: Yes    Impression/DX:  Brandon Thomas is a 44 year old male with a history of depression, opioid abuse and previous drug treatment/rehab, and hypogonadism.  The patient was admitted to the hospital on 04/03/2019 after being found by his wife unresponsive and gurgling sounds and CPR was initiated.  He did receive Narcan without response and had decerebrate posturing and concerns of seizure.  Urine drug screen was positive for benzo diazepam but not positive for opiates although the patient admits to using opiates immediately before losing consciousness.  Specific opiate testing for fentanyl was not conducted and the patient has acknowledged that he was using fentanyl at the time of his overdose.  The patient was intubated for airway protection and needed IVF for rhabdo and antibiotics for sepsis felt due to aspiration pneumonia and/or possible right upper extremity cellulitis.  EEG revealed severe diffuse encephalopathy.  MRI brain done revealed restricted diffusion throughout the hippocampi likely due to acute injury from toxic exposure and/or hypoxic event.  The patient continued to have significant short-term memory deficits and problem-solving but his perseverative speech and orientation have significantly improved during his comprehensive inpatient  rehabilitation stay.  The patient continues to complain about his right arm being swollen and having strange sensory sensations with his right arm and hand.  The patient describes ongoing short-term memory deficits but reports that most other cognitive functioning are within normal limits.  09/08/2019: The patient has returned to work and reports that he has been successfully doing his job but it is very stressful for him and he is having to make lots of notes and reminders on his phone to keep up with his job requirements.  He has been doing some traveling.  The patient denies any substance abuse or use and has continued to participate in intensive outpatient therapies for his substance abuse history.  The patient has not been going to NA meetings as has been requested.  The patient's wife is very stressed about his risk of vulnerability to relapse and this causes a lot of stress within the relationship.  The patient describes being pushed to his limits with his job and while he is not made any significant mistakes and his employer is happy with his work and he has been  doing well as far as production at work he is constantly feeling stressed which was also there prior to his anoxic brain injury.  09/30/2019:  Patient coping better with work travel but is still a stress for him.  Patient staying clean and doing well at work with extra note taking and reminders.  10/14/2019: The patient is continued to work successfully and while he has to manage his stress at work particularly when traveling and use extensive notes and reminders he has successfully completing his workplace expectations.  The patient denies any ongoing substance abuse or use and he has continued to participate and substance abuse recovery efforts.  The patient reports continued stressors with regard to his recovery and issues related to social isolation when either he is traveling or his family is away and he is at home alone continue to be  problematic and major stressors for him.  Disposition/Plan:  We have continue to work on therapeutic interventions around management of his residual cognitive deficits and prior substance abuse history.  The patient has had a good deal of stressors recently with his wife and children going on a planned vacation where he stays at home to take care of household and work-related activities.  Diagnosis:    Moderate hypoxic-ischemic encephalopathy  Substance abuse ([place])  Anxiety         Electronically Signed   _______________________ Ilean Skill, Psy.D.

## 2019-10-23 ENCOUNTER — Encounter: Payer: Self-pay | Admitting: Osteopathic Medicine

## 2019-10-23 ENCOUNTER — Ambulatory Visit (INDEPENDENT_AMBULATORY_CARE_PROVIDER_SITE_OTHER): Payer: 59 | Admitting: Osteopathic Medicine

## 2019-10-23 ENCOUNTER — Other Ambulatory Visit: Payer: Self-pay

## 2019-10-23 VITALS — BP 133/82 | HR 112 | Temp 98.6°F | Wt 221.1 lb

## 2019-10-23 DIAGNOSIS — Z0000 Encounter for general adult medical examination without abnormal findings: Secondary | ICD-10-CM | POA: Diagnosis not present

## 2019-10-23 DIAGNOSIS — Z8739 Personal history of other diseases of the musculoskeletal system and connective tissue: Secondary | ICD-10-CM | POA: Diagnosis not present

## 2019-10-23 DIAGNOSIS — I639 Cerebral infarction, unspecified: Secondary | ICD-10-CM | POA: Diagnosis not present

## 2019-10-23 NOTE — Progress Notes (Signed)
Brandon Thomas is a 44 y.o. male who presents to  [facility]  today, 10/23/19, seeking care for the following: . Annual physicla      ASSESSMENT & PLAN with other pertinent history/findings:  The primary encounter diagnosis was Annual physical exam. Diagnoses of Cerebrovascular accident (CVA), unspecified mechanism ([place]), History of rhabdomyolysis, and Moderate hypoxic-ischemic encephalopathy were also pertinent to this visit.  Doin gwell Detailed physial exam normal Labs due  Otherwise no concerns!  Continue w/ specialists as directed       Patient Instructions  General Preventive Care  Most recent routine screening labs: ordered today.   Blood pressure goal 130/80 or less.   Tobacco: don't!   Alcohol: responsible moderation is ok for most adults - if you have concerns about your alcohol intake, please talk to me!   Exercise: as tolerated to reduce risk of cardiovascular disease and diabetes. Strength training will also prevent osteoporosis.   Mental health: if need for mental health care (medicines, counseling, other), or concerns about moods, please let me know!   Sexual / Reproductive health: if need for STD testing, or if concerns with libido/pain problems, please let me know!  Advanced Directive: Living Will and/or Healthcare Power of Attorney recommended for all adults, regardless of age or health.  Vaccines  Flu vaccine: for almost everyone, every fall.   Shingles vaccine: after age 54.   Pneumonia vaccines: after age 75  Tetanus booster: every 10 years  COVID vaccine: THANKS for getting your vaccine! :)  Cancer screenings   Colon cancer screening: for everyone age 37-75. Colonoscopy available for all, many people also qualify for the Cologuard stool test   Prostate cancer screening: PSA blood test age 35-71  Lung cancer screening: not needed for non-smokers  Infection screenings   . HIV: recommended screening at least once age 75-65, more often as needed. . Gonorrhea/Chlamydia: screening as needed . Hepatitis C: recommended once for everyone age 54-75 . TB: certain at-risk populations, or depending on work requirements and/or travel history Other . Bone Density Test: recommended for men at age 39, sooner depending on risk factors . Abdominal Aortic Aneurysm: screening with ultrasound recommended once for men age 69-75 who have ever smoked     Orders Placed This Encounter  Procedures  . CBC  . COMPLETE METABOLIC PANEL WITH GFR  . Lipid panel    No orders of the defined types were placed in this encounter.      Follow-up instructions: Return in about 1 year (around 10/22/2020) for ANNUAL (call week prior to visit for lab orders) - SEE ME SOONER IF NEEDED! .                                         BP 133/82 (BP Location: Left Arm, Patient Position: Sitting, Cuff Size: Large)   Pulse (!) 112   Temp 98.6 F (37 C) (Oral)   Wt 221 lb 1.9 oz (100.3 kg)   BMI 30.84 kg/m   Current Meds  Medication Sig  . sertraline (ZOLOFT) 100 MG tablet Take 1 tablet (100 mg total) by mouth daily.    No results found for this or any previous visit (from the past 72 hour(s)).  No results found.  No flowsheet data found.  No flowsheet data found.    All questions at time of visit  were answered - patient instructed to contact office with any additional concerns or updates.  ER/RTC precautions were reviewed with the patient.  Please note: voice recognition software was used to produce this document, and typos may escape review. Please contact Dr. Sheppard Coil for any needed clarifications.

## 2019-10-23 NOTE — Patient Instructions (Signed)
General Preventive Care  Most recent routine screening labs: ordered today.   Blood pressure goal 130/80 or less.   Tobacco: don't!   Alcohol: responsible moderation is ok for most adults - if you have concerns about your alcohol intake, please talk to me!   Exercise: as tolerated to reduce risk of cardiovascular disease and diabetes. Strength training will also prevent osteoporosis.   Mental health: if need for mental health care (medicines, counseling, other), or concerns about moods, please let me know!   Sexual / Reproductive health: if need for STD testing, or if concerns with libido/pain problems, please let me know!  Advanced Directive: Living Will and/or Healthcare Power of Attorney recommended for all adults, regardless of age or health.  Vaccines  Flu vaccine: for almost everyone, every fall.   Shingles vaccine: after age 45.   Pneumonia vaccines: after age 63  Tetanus booster: every 10 years  COVID vaccine: THANKS for getting your vaccine! :)  Cancer screenings   Colon cancer screening: for everyone age 63-75. Colonoscopy available for all, many people also qualify for the Cologuard stool test   Prostate cancer screening: PSA blood test age 68-71  Lung cancer screening: not needed for non-smokers  Infection screenings  . HIV: recommended screening at least once age 56-65, more often as needed. . Gonorrhea/Chlamydia: screening as needed . Hepatitis C: recommended once for everyone age 40-75 . TB: certain at-risk populations, or depending on work requirements and/or travel history Other . Bone Density Test: recommended for men at age 11, sooner depending on risk factors . Abdominal Aortic Aneurysm: screening with ultrasound recommended once for men age 40-75 who have ever smoked

## 2019-10-28 ENCOUNTER — Encounter: Payer: 59 | Admitting: Psychology

## 2019-10-28 ENCOUNTER — Other Ambulatory Visit: Payer: Self-pay

## 2019-10-28 DIAGNOSIS — G9340 Encephalopathy, unspecified: Secondary | ICD-10-CM | POA: Diagnosis not present

## 2019-10-28 DIAGNOSIS — F419 Anxiety disorder, unspecified: Secondary | ICD-10-CM | POA: Diagnosis not present

## 2019-10-28 DIAGNOSIS — F1910 Other psychoactive substance abuse, uncomplicated: Secondary | ICD-10-CM

## 2019-10-29 ENCOUNTER — Encounter: Payer: Self-pay | Admitting: Psychology

## 2019-10-29 NOTE — Progress Notes (Signed)
Neuropsychological Consultation   Patient:   Brandon Thomas   DOB:   1975/07/22  MR Number:  [id number]  Location:  [facility] [place], [place] [id number] [address] [place] [address] Dept: [phone]           Date of Service:   10/28/2019  Start Time:   10 AM End Time:   11 AM  Today's visit was a 1 hour long appointment conducted in person in my outpatient clinic office.  The patient and myself were present for this visit.  Provider/Observer:  Ilean Skill, Psy.D.       Clinical Neuropsychologist       Billing Code/Service: 96158/96159  Chief Complaint:    Brandon Thomas is a 44 year old male with a history of depression, opioid abuse and previous drug treatment/rehab, and hypogonadism.  The patient was admitted to the hospital on 04/03/2019 after being found by his wife unresponsive and gurgling sounds and CPR was initiated.  He did receive Narcan without response and had decerebrate posturing and concerns of seizure.  Urine drug screen was positive for benzo diazepam but not positive for opiates although the patient admits to using opiates immediately before losing consciousness.  Specific opiate testing for fentanyl was not conducted and the patient has acknowledged that he was using fentanyl at the time of his overdose.  The patient was intubated for airway protection and needed IVF for rhabdo and antibiotics for sepsis felt due to aspiration pneumonia and/or possible right upper extremity cellulitis.  EEG revealed severe diffuse encephalopathy.  MRI brain done revealed restricted diffusion throughout the hippocampi likely due to acute injury from toxic exposure and/or hypoxic event.  The patient continued to have significant short-term memory deficits and problem-solving but his perseverative speech and orientation have significantly improved during his  comprehensive inpatient rehabilitation stay.  The patient continues to complain about his right arm being swollen and having strange sensory sensations with his right arm and hand.  Reason for Service:  Brandon Thomas is a 44 year old male with a history of depression, opioid abuse and previous drug treatment/rehab, and hypogonadism.  The patient was admitted to the hospital on 04/03/2019 after being found by his wife unresponsive and gurgling sounds and CPR was initiated.  He did receive Narcan without response and had decerebrate posturing and concerns of seizure.  Urine drug screen was positive for benzo diazepam but not positive for opiates although the patient admits to using opiates immediately before losing consciousness.  Specific opiate testing for fentanyl was not conducted and the patient has acknowledged that he was using fentanyl at the time of his overdose.  The patient was intubated for airway protection and needed IVF for rhabdo and antibiotics for sepsis felt due to aspiration pneumonia and/or possible right upper extremity cellulitis.  EEG revealed severe diffuse encephalopathy.  MRI brain done revealed restricted diffusion throughout the hippocampi likely due to acute injury from toxic exposure and/or hypoxic event.  The patient continued to have significant short-term memory deficits and problem-solving but his perseverative speech and orientation have significantly improved during his comprehensive inpatient rehabilitation stay.  The patient continues to complain about his right arm being swollen and having strange sensory sensations with his right arm and hand.  The patient has now been discharged from the comprehensive inpatient rehabilitation program and is now back home.  The patient has made significant recovery in overall functioning but continues to  have short-term memory difficulties that while they are improving persist.  The patient is difficulties remembering small tasks  and questions or comments from others but has been able to learn new information and is continuing with rehabilitative efforts.  The patient reports that he has been able to abstain from any substance abuse denies any recent cravings or other features related to his substance abuse history.  The patient reports that while he had had previous long term substance abuse he had gone through rehabilitative efforts and had not been using up until the day that he OD.  The patient reports that this was a singular event and had not been preceded by days of substance use.  The patient is continue to take sertraline as well as a baby aspirin.  The patient reports that he is sleeping normally and reports that he has had a significant increase in appetite recently.  09/08/2019: The patient has returned to work and reports that he has been successfully doing his job but it is very stressful for him and he is having to make lots of notes and reminders on his phone to keep up with his job requirements.  He has been doing some traveling.  The patient denies any substance abuse or use and has continued to participate in intensive outpatient therapies for his substance abuse history.  The patient has not been going to NA meetings as has been requested.  The patient's wife is very stressed about his risk of vulnerability to relapse and this causes a lot of stress within the relationship.  The patient describes being pushed to his limits with his job and while he is not made any significant mistakes and his employer is happy with his work and he has been doing well as far as production at work he is constantly feeling stressed which was also there prior to his anoxic brain injury.  10/14/2019: The patient is continued to work successfully and while he has to manage his stress at work particularly when traveling and use extensive notes and reminders he has successfully completing his workplace expectations.  The patient denies any  ongoing substance abuse or use and he has continued to participate and substance abuse recovery efforts.  The patient reports continued stressors with regard to his recovery and issues related to social isolation when either he is traveling or his family is away and he is at home alone continue to be problematic and major stressors for him.  10/28/2019:  Patient has continued with significant improvement in his overall functioning.  Today was our last visit and we worked on wrapping up therapeutic interventions but I will remain available going forward if the need arises.  The patient has continued to do well as far as his job and overall as far as cognitive functioning.  He continues to use memory aids.  I am available if the patient runs into difficulties.  Current Status:  The patient describes ongoing short-term memory deficits but reports that most other cognitive functioning are within normal limits.  Reliability of Information: The information is derived from 1 hour face-to-face clinical interview as well as review of available medical records.  Behavioral Observation: Brandon Thomas  presents as a 44 y.o.-year-old Right Caucasian Male who appeared his stated age. his dress was Appropriate and he was Well Groomed and his manners were Appropriate to the situation.  his participation was indicative of Appropriate and Redirectable behaviors.  There were not any physical disabilities noted.  he displayed  an appropriate level of cooperation and motivation.     Interactions:    Active Appropriate and Redirectable  Attention:   abnormal and attention span appeared shorter than expected for age  Memory:   abnormal; remote memory intact, recent memory impaired  Visuo-spatial:  not examined  Speech (Volume):  normal  Speech:   normal; normal  Thought Process:  Coherent and Relevant  Though Content:  WNL; not suicidal and not homicidal  Orientation:   person, place, time/date and  situation  Judgment:   Fair  Planning:   Fair  Affect:    Appropriate  Mood:    Dysphoric  Insight:   Good  Intelligence:   high  Marital Status/Living: The patient was born in [place] and has 2 siblings.  He is married and they have 2 children.  He has been married for the past 12 years and has no previous marriages.  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  Substance Use:  There is a documented history of prescription drug and Opiate-based medications abuse confirmed by the patient.    Education:   Engineering geologist History:   Past Medical History:  Diagnosis Date  . Depression    Well treated without meds currently  . Elevated LFTs 08/20/2013  . Hyperlipidemia 08/20/2013        Psychiatric History:  The patient does have a history of depression and is continuing to take sertraline.  He also has a past history of substance abuse with inpatient rehabilitative admissions.  Family Med/Psych History:  Family History  Adopted: Yes    Impression/DX:  Brandon Thomas is a 44 year old male with a history of depression, opioid abuse and previous drug treatment/rehab, and hypogonadism.  The patient was admitted to the hospital on 04/03/2019 after being found by his wife unresponsive and gurgling sounds and CPR was initiated.  He did receive Narcan without response and had decerebrate posturing and concerns of seizure.  Urine drug screen was positive for benzo diazepam but not positive for opiates although the patient admits to using opiates immediately before losing consciousness.  Specific opiate testing for fentanyl was not conducted and the patient has acknowledged that he was using fentanyl at the time of his overdose.  The patient was intubated for airway protection and needed IVF for rhabdo and antibiotics for sepsis felt due to aspiration pneumonia  and/or possible right upper extremity cellulitis.  EEG revealed severe diffuse encephalopathy.  MRI brain done revealed restricted diffusion throughout the hippocampi likely due to acute injury from toxic exposure and/or hypoxic event.  The patient continued to have significant short-term memory deficits and problem-solving but his perseverative speech and orientation have significantly improved during his comprehensive inpatient rehabilitation stay.  The patient continues to complain about his right arm being swollen and having strange sensory sensations with his right arm and hand.  The patient describes ongoing short-term memory deficits but reports that most other cognitive functioning are within normal limits.  09/08/2019: The patient has returned to work and reports that he has been successfully doing his job but it is very stressful for him and he is having to make lots of notes and reminders on his phone to keep up with his job requirements.  He has been doing some traveling.  The patient denies any substance abuse or use and has continued to participate in intensive outpatient therapies for  his substance abuse history.  The patient has not been going to NA meetings as has been requested.  The patient's wife is very stressed about his risk of vulnerability to relapse and this causes a lot of stress within the relationship.  The patient describes being pushed to his limits with his job and while he is not made any significant mistakes and his employer is happy with his work and he has been doing well as far as production at work he is constantly feeling stressed which was also there prior to his anoxic brain injury.  09/30/2019:  Patient coping better with work travel but is still a stress for him.  Patient staying clean and doing well at work with extra note taking and reminders.  10/14/2019: The patient is continued to work successfully and while he has to manage his stress at work particularly when  traveling and use extensive notes and reminders he has successfully completing his workplace expectations.  The patient denies any ongoing substance abuse or use and he has continued to participate and substance abuse recovery efforts.  The patient reports continued stressors with regard to his recovery and issues related to social isolation when either he is traveling or his family is away and he is at home alone continue to be problematic and major stressors for him.  10/28/2019:  Patient has continued with significant improvement in his overall functioning.  Today was our last visit and we worked on wrapping up therapeutic interventions but I will remain available going forward if the need arises.  The patient has continued to do well as far as his job and overall as far as cognitive functioning.  He continues to use memory aids.  I am available if the patient runs into difficulties.  Disposition/Plan:  We have now wrapped of therapeutic interventions with the patient showing significant improvement overall in cognition and coping.  The patient has remained free of any substance abuse and has been developing skills to handle stressors that have triggered her substance use in the past.  The patient is also made significant improvements in memory and cognition.  He is back to work full-time.  Diagnosis:    Moderate hypoxic-ischemic encephalopathy  Substance abuse ([place])  Anxiety         Electronically Signed   _______________________ Ilean Skill, Psy.D.

## 2019-12-29 DIAGNOSIS — J358 Other chronic diseases of tonsils and adenoids: Secondary | ICD-10-CM | POA: Insufficient documentation

## 2020-01-28 ENCOUNTER — Ambulatory Visit: Payer: 59 | Admitting: Psychology

## 2020-01-28 ENCOUNTER — Encounter: Payer: 59 | Admitting: Physical Medicine & Rehabilitation

## 2020-02-03 ENCOUNTER — Encounter: Payer: 59 | Attending: Psychology | Admitting: Psychology

## 2020-03-10 ENCOUNTER — Other Ambulatory Visit: Payer: Self-pay

## 2020-03-10 ENCOUNTER — Encounter: Payer: Self-pay | Admitting: Physical Medicine & Rehabilitation

## 2020-03-10 ENCOUNTER — Encounter: Payer: 59 | Attending: Psychology | Admitting: Physical Medicine & Rehabilitation

## 2020-03-10 NOTE — Patient Instructions (Signed)
PLEASE FEEL FREE TO CALL OUR OFFICE WITH ANY PROBLEMS OR QUESTIONS ([phone])      

## 2020-03-10 NOTE — Progress Notes (Signed)
Subjective:    Patient ID: Brandon Thomas, male    DOB: Oct 26, 1975, 44 y.o.   MRN: [id number]  HPI   Brandon Thomas is here in follow up of his ABI and associated deficits. He is working full time. He has some short term memory deficits but is taking notes, keeping a routine, calendar, etc to help accommodate. He is trying to find a better life balance. He spends more time with family, finds time to work out and is delegating work when he needs to. He typically works with a lot of people and is constantly building relationships with clients.   Mood remains positive. He's off zoloft. He's getting along with friends and family.Marland Kitchen He continues to go to [employer] weekly.    Pain Inventory Average Pain 0 Pain Right Now 0 My pain is No pain. Just memory problem.  LOCATION OF PAIN  No pain  BOWEL Number of stools per week: 14 Oral laxative use No  Type of laxative None Enema or suppository use No  History of colostomy No  Incontinent No   BLADDER Normal In and out cath, frequency N/A Able to self cath N/A Bladder incontinence No  Frequent urination No  Leakage with coughing No  Difficulty starting stream No  Incomplete bladder emptying No    Mobility ability to climb steps?  yes do you drive?  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  Neuro/Psych No problems in this area numbness  Prior Studies Any changes since last visit?  no  Physicians involved in your care Any changes since last visit?  no   Family History  Adopted: Yes   Social History   Socioeconomic History  . Marital status: Married    Spouse name: Not on file  . Number of children: 2  . Years of education: Not on file  . Highest education level: Not on file  Occupational History  . Occupation: Brandon Thomas  Tobacco Use  . Smoking status: Never Smoker  . Smokeless tobacco: Current User    Types: Chew  Vaping Use  . Vaping Use: Never used  Substance and Sexual Activity  .  Alcohol use: Yes    Alcohol/week: 1.0 standard drink    Types: 1 Standard drinks or equivalent per week  . Drug use: No  . Sexual activity: Yes    Partners: Female  Other Topics Concern  . Not on file  Social History Narrative   Two children ages 18 and 56.  Happily married x 9 years.     Social Determinants of Health   Financial Resource Strain:   . Difficulty of Paying Living Expenses: Not on file  Food Insecurity:   . Worried About Charity fundraiser in the Last Year: Not on file  . Ran Out of Food in the Last Year: Not on file  Transportation Needs:   . Lack of Transportation (Medical): Not on file  . Lack of Transportation (Non-Medical): Not on file  Physical Activity:   . Days of Exercise per Week: Not on file  . Minutes of Exercise per Session: Not on file  Stress:   . Feeling of Stress : Not on file  Social Connections:   . Frequency of Communication with Friends and Family: Not on file  . Frequency of Social Gatherings with Friends and Family: Not on file  . Attends Religious Services: Not on file  . Active Member of Clubs or Organizations: Not on file  .  Attends Archivist Meetings: Not on file  . Marital Status: Not on file   Past Surgical History:  Procedure Laterality Date  . WISDOM TOOTH EXTRACTION     Past Medical History:  Diagnosis Date  . Depression    Well treated without meds currently  . Elevated LFTs 08/20/2013  . Hyperlipidemia 08/20/2013   BP 126/74   Pulse 94   Temp 98 F (36.7 C)   Ht 5' 11"  (1.803 m)   Wt 220 lb 9.6 oz (100.1 kg)   SpO2 96%   BMI 30.77 kg/m   Opioid Risk Score:   Fall Risk Score:  `1  Depression screen PHQ 2/9  Depression screen PHQ 2/9 10/23/2019  Decreased Interest 2  Down, Depressed, Hopeless 1  PHQ - 2 Score 3  Altered sleeping 1  Tired, decreased energy 1  Change in appetite 1  Feeling bad or failure about yourself  0  Trouble concentrating 1  Moving slowly or fidgety/restless 0  Suicidal  thoughts 0  PHQ-9 Score 7  Difficult doing work/chores Somewhat difficult   Review of Systems  Constitutional: Negative.   HENT: Negative.   Eyes: Negative.   Respiratory: Negative.   Cardiovascular: Negative.   Gastrointestinal: Negative.   Endocrine: Negative.   Genitourinary: Negative.   Musculoskeletal: Negative.   Skin: Negative.   Allergic/Immunologic: Negative.   Neurological: Positive for numbness.       Memory issues  Hematological: Negative.   Psychiatric/Behavioral: Negative.   All other systems reviewed and are negative.      Objective:   Physical Exam  General: No acute distress HEENT: EOMI, oral membranes moist Cards: reg rate  Chest: normal effort Abdomen: Soft, NT, ND Skin: dry, intact Extremities: no edema  Neurological: Alert and oriented x 3. Normal insight and awareness. Intact Memory. Normal language and speech. Cranial nerve exam unremarkable. Normal motor and sensory., Pysch: pleasant and appropriate.         Assessment & Plan:  1.  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   -Has made consistent improvements and is functional from a cognitive standpoint at this time          -continue with the use of schedule, calendar, keep routine/notes to maximize information retention for work. 2. Mood: Overall mood is improving.               -Patient is off antidepressants             -needs to maintain social supports, support groups.  He is doing a good job with this 3.  Driving:  No limitations on driving 4.  Maintain work/life balance             -Patient's insight has improved quite a bit.  Encouraged him to remember maintaining appropriate levels of work as well as personal time 5. Narcotic abuse.              -He is doing a nice job maintaining follow-up at [facility].  He seems committed to doing this in the future as well.       Fifteen minutes of face to face patient care time were spent during this visit. All  questions were encouraged and answered.  Follow up with me prn .

## 2020-03-26 ENCOUNTER — Emergency Department: Admission: RE | Admit: 2020-03-26 | Discharge status: Absent without leave | Payer: 59 | Source: Ambulatory Visit

## 2020-03-26 ENCOUNTER — Telehealth: Payer: Self-pay

## 2020-03-26 NOTE — Telephone Encounter (Signed)
Patient's wife called stating that he was evaluated at an urgent care in [place] around a month and was diagnosed with pneumonia. Patient still having painful cough and not feeling well, thinks he may need antibiotic and also wanting an in-person evaluation. States he had 2 negative COVID tests (did not state dates nor if these were done at home or not).   Discussed with wife that patient does need in person evaluation, but we do not have any openings today. Advised her to take patient to urgent care for evaluation where he can have evaluation done and also imaging, if needed. Advised her we can see the notes from urgent care and she can call us next week and we can do follow up with him as necessary after urgent care evaluation.   Wife agreeable with plan, will try to make patient appt online for urgent care to get evaluation done today and will follow up with [place] as needed.   FYI to PCP

## 2020-03-30 ENCOUNTER — Emergency Department
Admission: EM | Admit: 2020-03-30 | Discharge: 2020-03-30 | Disposition: A | Discharge status: Home / Self Care | Payer: 59 | Source: Home / Self Care

## 2020-03-30 ENCOUNTER — Emergency Department (INDEPENDENT_AMBULATORY_CARE_PROVIDER_SITE_OTHER): Payer: 59

## 2020-03-30 ENCOUNTER — Emergency Department: Admit: 2020-03-30 | Discharge status: Absent without leave | Payer: Self-pay

## 2020-03-30 ENCOUNTER — Encounter: Payer: Self-pay | Admitting: Family Medicine

## 2020-03-30 DIAGNOSIS — J069 Acute upper respiratory infection, unspecified: Secondary | ICD-10-CM

## 2020-03-30 DIAGNOSIS — Z9189 Other specified personal risk factors, not elsewhere classified: Secondary | ICD-10-CM

## 2020-03-30 DIAGNOSIS — R059 Cough, unspecified: Secondary | ICD-10-CM | POA: Diagnosis not present

## 2020-03-30 MED ORDER — METHYLPREDNISOLONE 4 MG PO TBPK: ORAL_TABLET | ORAL | 0 refills | Status: DC

## 2020-03-30 MED ORDER — BENZONATATE 100 MG PO CAPS: 100.0000 mg | ORAL_CAPSULE | Freq: Three times a day (TID) | ORAL | 0 refills | Status: DC | PRN

## 2020-03-30 NOTE — Discharge Instructions (Addendum)
The x-ray shows no pneumonia at this point.  Instead, your symptoms and physical exam are consistent with a bronchitis.  Finish the doxycycline, continue the albuterol several times a day.  Return if you are not better in several days.  We are running a Covid test just in case.

## 2020-03-30 NOTE — ED Triage Notes (Signed)
Cough x 2 months - has been on prednisone x 2  Started on doxycycline - still taking - prednisone completed  Pt has had 2 negative covid tests (home tests) Nyquil last night COVID vaccine [facility] April  2021

## 2020-03-30 NOTE — ED Provider Notes (Signed)
[facility]    CSN: [id number] Arrival date & time: 03/30/20  0865      History   Chief Complaint Chief Complaint  Patient presents with  . Cough    HPI Brandon Thomas is a 44 y.o. male.   This is the initial [place] urgent care visit for this 44 year old man with a cough.  He has had the problem for over 2 months.  He is currently taking doxycycline.  He has had 2 rounds of prednisone as well.  He has tried NyQuil.  Patient has performed 2 Covid test at home and they were both negative.  He did have a [employer] Covid vaccine in April 2021.   Patient works from home.  He has 2 children and is married.  None of the family members are sick.  Patient is not particularly short of breath, has not lost his sense of smell, and has no GI symptoms.  Patient has had a substance abuse problem and is trying to avoid narcotics.  He had a respiratory arrest last year and had to be resuscitated.    He has used an inhaler which has not helped.  He has no history of asthma.      Past Medical History:  Diagnosis Date  . Depression    Well treated without meds currently  . Elevated LFTs 08/20/2013  . Hyperlipidemia 08/20/2013    Patient Active Problem List   Diagnosis Date Noted  . Moderate hypoxic-ischemic encephalopathy   . CVA (cerebral vascular accident) ([place]) 04/09/2019  . Cellulitis 04/09/2019  . Superficial venous thrombosis of arm, right 04/09/2019  . Edema of right upper arm 04/09/2019  . Substance abuse ([place]) 04/09/2019  . Encephalopathy 04/09/2019  . Anal fissure 04/15/2015  . Hypogonadism in male 04/13/2015  . Chest pain 04/13/2015  . Snoring 04/13/2015  . Fatigue 04/13/2015  . Dyslipidemia 04/13/2015  . Insomnia 07/17/2014  . Anxiety 09/08/2013  . Hyperlipidemia 08/20/2013  . Elevated LFTs 08/20/2013  . Exertional chest pain 08/20/2013    Past Surgical History:  Procedure Laterality Date  . WISDOM TOOTH EXTRACTION          Home Medications    Prior to Admission medications   Medication Sig Start Date End Date Taking? Authorizing Provider  albuterol (VENTOLIN HFA) 108 (90 Base) MCG/ACT inhaler Inhale into the lungs. 01/20/20  Yes [provider]  doxycycline (VIBRAMYCIN) 100 MG capsule Take 100 mg by mouth 2 (two) times daily. 03/26/20  Yes [provider]  benzonatate (TESSALON) 100 MG capsule Take 1-2 capsules (100-200 mg total) by mouth 3 (three) times daily as needed for cough. 03/30/20   Robyn Haber, MD  methylPREDNISolone (MEDROL DOSEPAK) 4 MG TBPK tablet 5-4-3-2-1 daily dose 03/30/20   Robyn Haber, MD  Misc. Devices MISC Start CPAP therapy at 15 cm water pressure.  CPAP machine with a mask and supplies for OSA.  ResMed AirFit P30i medium pillow mask or patient preference.  Send to a local DME 12/17/19   [provider]  sertraline (ZOLOFT) 100 MG tablet Take 1 tablet (100 mg total) by mouth daily. Patient not taking: Reported on 03/10/2020 05/07/19 03/30/20  Emeterio Reeve, DO    Family History Family History  Adopted: Yes  Family history unknown: Yes    Social History Social History   Tobacco Use  . Smoking status: Never Smoker  . Smokeless tobacco: Current User    Types: Chew  Vaping Use  . Vaping Use: Never used  Substance Use Topics  . Alcohol use: Not Currently  . Drug use: Not Currently     Allergies   Atorvastatin, Crestor [rosuvastatin], and Penicillins   Review of Systems Review of Systems  Constitutional: Negative.   Respiratory: Positive for cough.      Physical Exam Triage Vital Signs ED Triage Vitals  Enc Vitals Group     BP 03/30/20 0959 131/82     Pulse Rate 03/30/20 0959 80     Resp 03/30/20 0959 17     Temp 03/30/20 0959 98.5 F (36.9 C)     Temp Source 03/30/20 0959 Oral     SpO2 03/30/20 0959 100 %     Weight 03/30/20 1000 220 lb (99.8 kg)     Height 03/30/20 1000 5' 11"  (1.803 m)     Head Circumference  --      Peak Flow --      Pain Score --      Pain Loc --      Pain Edu? --      Excl. in [place]? --    No data found.  Updated Vital Signs BP 131/82 (BP Location: Right Arm)   Pulse 80   Temp 98.5 F (36.9 C) (Oral)   Resp 17   Ht 5' 11"  (1.803 m)   Wt 99.8 kg   SpO2 100%   BMI 30.68 kg/m    Physical Exam Vitals and nursing note reviewed.  Constitutional:      Appearance: Normal appearance. He is normal weight.  Eyes:     Conjunctiva/sclera: Conjunctivae normal.  Cardiovascular:     Rate and Rhythm: Normal rate and regular rhythm.     Heart sounds: Normal heart sounds.  Pulmonary:     Effort: Pulmonary effort is normal.     Breath sounds: Rales present.     Comments: Patient has dry rales in the left chest Abdominal:     General: Abdomen is flat.  Musculoskeletal:        General: Normal range of motion.     Cervical back: Normal range of motion and neck supple.  Skin:    General: Skin is warm and dry.  Neurological:     General: No focal deficit present.     Mental Status: He is alert and oriented to person, place, and time.  Psychiatric:        Mood and Affect: Mood normal.        Behavior: Behavior normal.        Thought Content: Thought content normal.        Judgment: Judgment normal.      UC Treatments / Results  Labs (all labs ordered are listed, but only abnormal results are displayed) Labs Reviewed  COVID-19, FLU A+B AND RSV    EKG Chest x-ray shows no infiltrate  Radiology DG Chest 2 View  Result Date: 03/30/2020 CLINICAL DATA:  Chronic cough. EXAM: CHEST - 2 VIEW COMPARISON:  04/03/2020.  04/04/2019.  04/02/2019. FINDINGS: Mediastinum and hilar structures normal. Heart size normal. No focal infiltrate. No pleural effusion or pneumothorax. No acute bony abnormality. IMPRESSION: No acute cardiopulmonary disease. Electronically Signed   By: Marcello Moores  Register   On: 03/30/2020 10:52    Procedures Procedures (including critical care  time)  Medications Ordered in UC Medications - No data to display  Initial Impression / Assessment and Plan / UC Course  I have reviewed the triage vital signs and the nursing notes.  Pertinent labs & imaging  results that were available during my care of the patient were reviewed by me and considered in my medical decision making (see chart for details).    Final Clinical Impressions(s) / UC Diagnoses   Final diagnoses:  At increased risk of exposure to COVID-19 virus  Viral URI with cough     Discharge Instructions     The x-ray shows no pneumonia at this point.  Instead, your symptoms and physical exam are consistent with a bronchitis.  Finish the doxycycline, continue the albuterol several times a day.  Return if you are not better in several days.  We are running a Covid test just in case.    ED Prescriptions    Medication Sig Dispense Auth. Provider   methylPREDNISolone (MEDROL DOSEPAK) 4 MG TBPK tablet 5-4-3-2-1 daily dose 15 tablet Robyn Haber, MD   benzonatate (TESSALON) 100 MG capsule Take 1-2 capsules (100-200 mg total) by mouth 3 (three) times daily as needed for cough. 40 capsule Robyn Haber, MD     I have reviewed the PDMP during this encounter.   Robyn Haber, MD 03/30/20 1056

## 2020-03-31 ENCOUNTER — Ambulatory Visit: Payer: 59 | Admitting: Physical Medicine & Rehabilitation

## 2020-04-01 LAB — COVID-19, FLU A+B AND RSV
Influenza A, NAA: NOT DETECTED
Influenza B, NAA: NOT DETECTED
RSV, NAA: NOT DETECTED
SARS-CoV-2, NAA: NOT DETECTED

## 2020-05-21 IMAGING — MR MR HEAD W/O CM
11 series · 48 of 48 positions shown · non-contrast
Comparison: Head CT 04/03/2019

CLINICAL DATA: Encephalopathy. Anterograde amnesia. History of
polysubstance abuse. Found down. Clinical concern for hippocampal
injury associated with fentanyl.

EXAM:
MRI HEAD WITHOUT CONTRAST
TECHNIQUE: Multiplanar, multiecho pulse sequences of the brain and surrounding
structures were obtained without intravenous contrast.

[Series 5: DWI · axial · 3.0mm · 0.88mm/px · z∈[-80,+60]mm · 9 of 96 slices shown (1 of 6)]
[im 1/96]
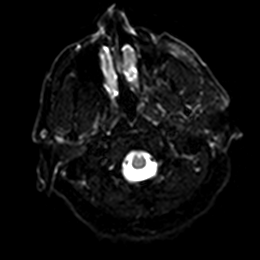
[im 12/96]
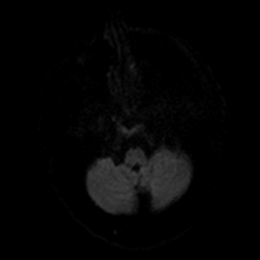
[im 24/96]
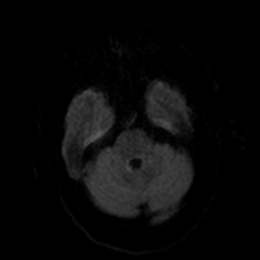
[im 36/96]
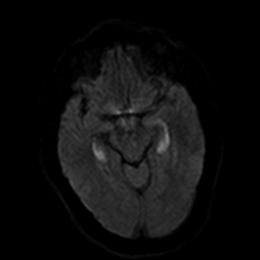
[im 48/96]
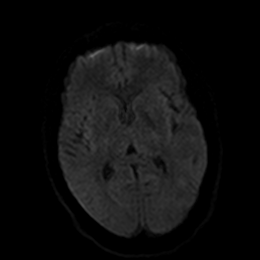
[im 60/96]
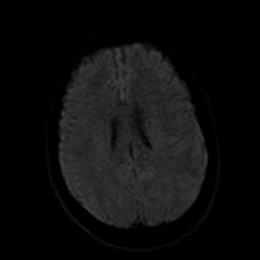
[im 72/96]
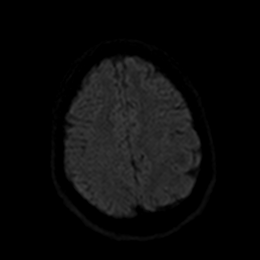
[im 84/96]
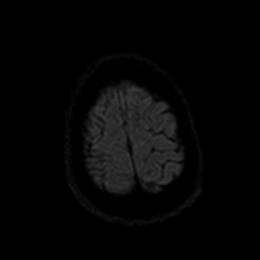
[im 96/96]
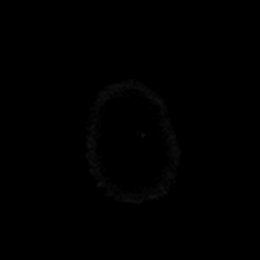

[Series 6: DWI · axial · 3.0mm · 0.88mm/px · z∈[-80,+60]mm · 4 of 47 slices shown (2 of 6)]
[im 1/47]
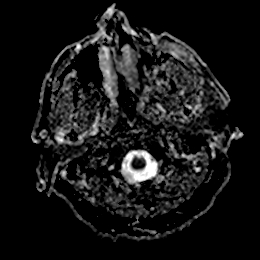
[im 16/47]
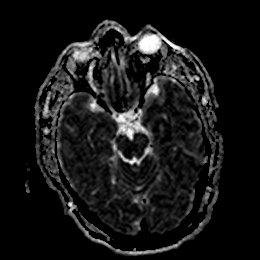
[im 31/47]
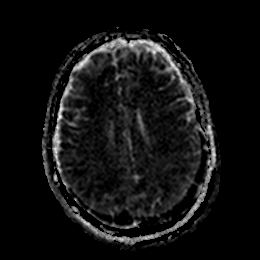
[im 47/47]
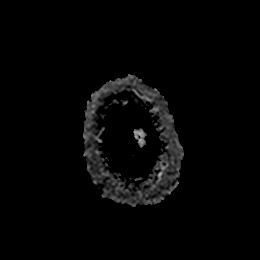

[Series 11: DWI · coronal · 4.0mm · 0.88mm/px · 6 of 76 slices shown (3 of 6)]
[im 1/76]
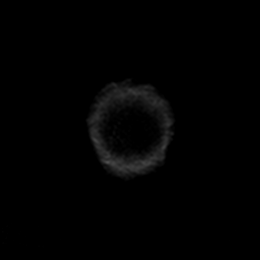
[im 16/76]
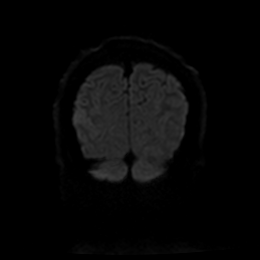
[im 31/76]
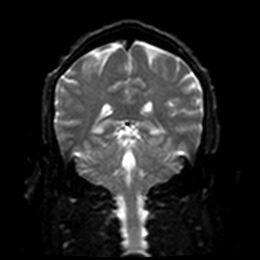
[im 46/76]
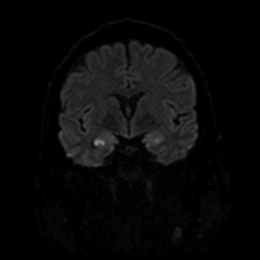
[im 61/76]
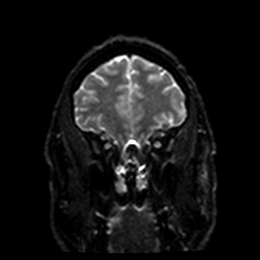
[im 76/76]
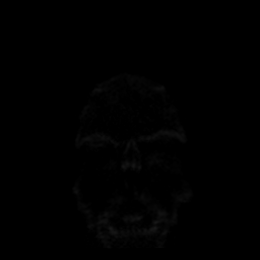

[Series 12: DWI · coronal · 4.0mm · 0.88mm/px · 3 of 38 slices shown (4 of 6)]
[im 1/38]
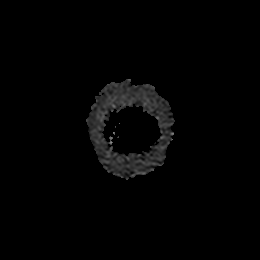
[im 19/38]
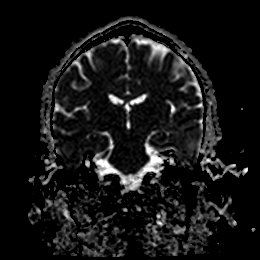
[im 38/38]
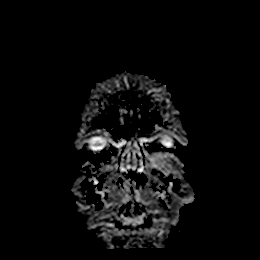

[Series 13: T1 · sagittal · 5.0mm · 0.75mm/px · 2 of 25 slices shown]
[im 1/25]
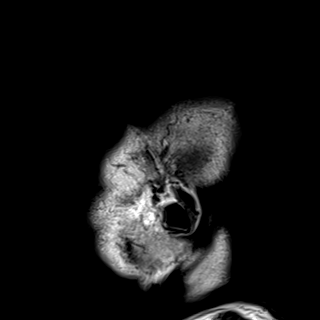
[im 25/25]
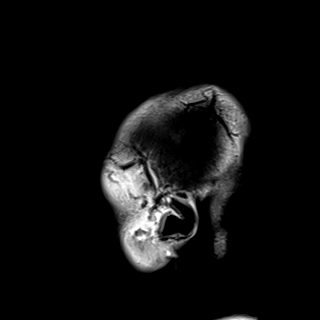

[Series 14: T2 · axial · 5.0mm · 0.72mm/px · z∈[-92,+51]mm · 2 of 25 slices shown]
[im 1/25]
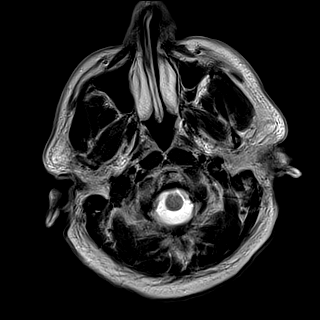
[im 25/25]
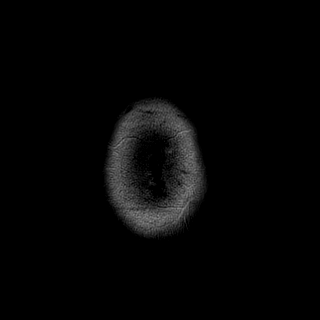

[Series 15: DWI · axial · 3.0mm · 0.88mm/px · z∈[-98,+43]mm · 8 of 96 slices shown (5 of 6)]
[im 1/96]
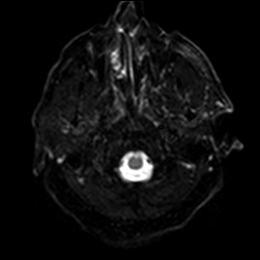
[im 14/96]
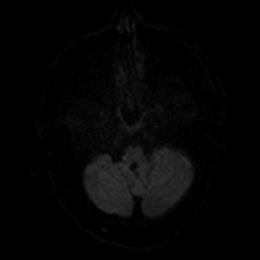
[im 28/96]
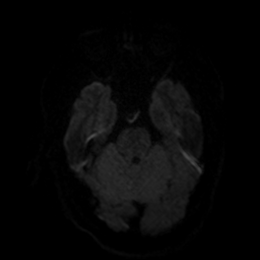
[im 41/96]
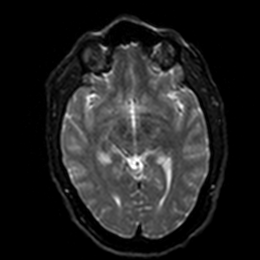
[im 55/96]
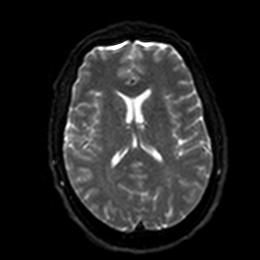
[im 68/96]
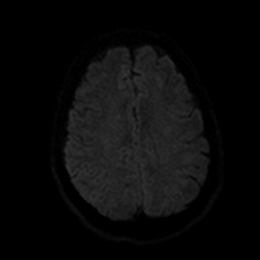
[im 82/96]
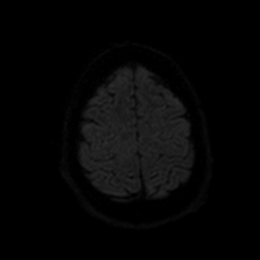
[im 96/96]
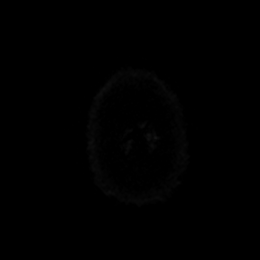

[Series 16: DWI · axial · 3.0mm · 0.88mm/px · z∈[-98,+43]mm · 4 of 48 slices shown (6 of 6)]
[im 1/48]
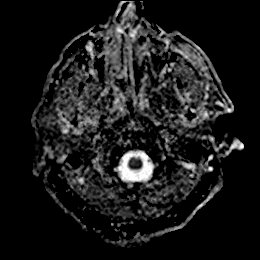
[im 16/48]
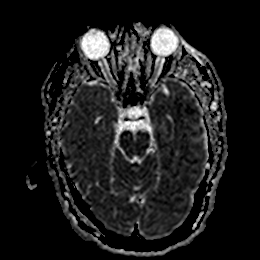
[im 32/48]
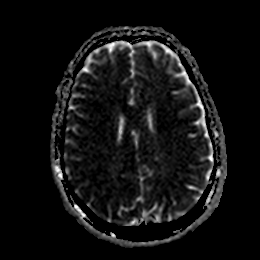
[im 48/48]
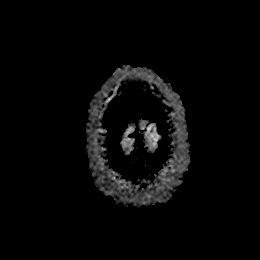

[Series 17: FLAIR · axial · 5.0mm · 0.45mm/px · z∈[-92,+51]mm · 2 of 25 slices shown]
[im 1/25]
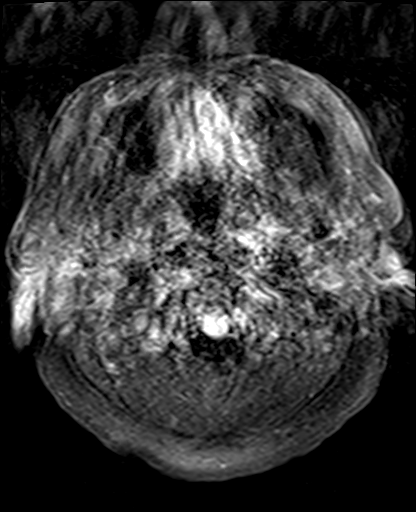
[im 25/25]
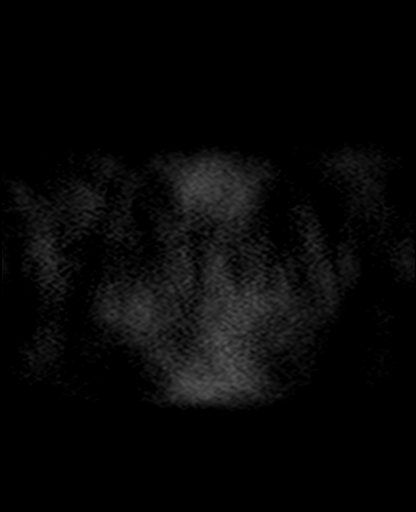

[Series 19: pha_images · axial · 3.0mm · 0.90mm/px · z∈[-70,+40]mm · 3 of 33 slices shown]
[im 1/33]
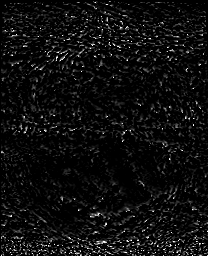
[im 17/33]
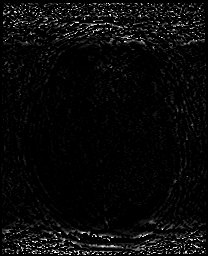
[im 33/33]
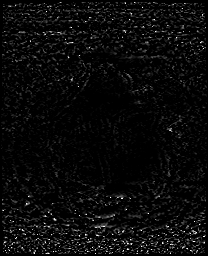

[Series 20: swi_images · axial · 3.0mm · 0.90mm/px · z∈[-109,+67]mm · 5 of 60 slices shown]
[im 1/60]
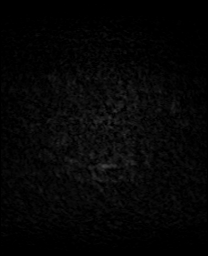
[im 15/60]
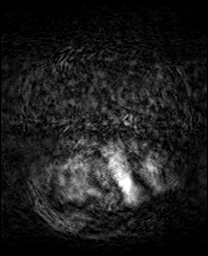
[im 30/60]
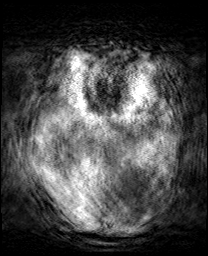
[im 45/60]
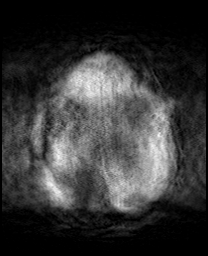
[im 60/60]
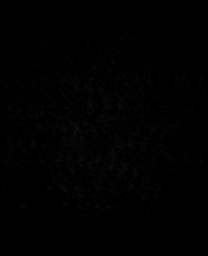

[48 of 48 positions shown; findings below may reference images not displayed]

FINDINGS: The patient was unable to tolerate the complete examination. Axial
and coronal diffusion, susceptibility weighted imaging, sagittal T1,
axial T2, and axial FLAIR sequences were obtained with some being
severely motion degraded.

Brain: There is prominent restricted diffusion and T2 hyperintensity
diffusely throughout the hippocampi bilaterally. No restricted
diffusion is present elsewhere. No gross intracranial hemorrhage,
intracranial mass effect, or extra-axial fluid collection is
identified. The ventricles and sulci are normal in size.

Vascular: Major intracranial vascular flow voids are grossly
preserved.

Skull and upper cervical spine: Unremarkable bone marrow signal.

Sinuses/Orbits: Grossly unremarkable orbits. Grossly clear sinuses.

Other: None.
IMPRESSION: 1. Severely motion degraded, incomplete examination.
2. Restricted diffusion diffusely throughout the hippocampi likely
reflecting acute injury from toxic exposure given clinical history.

## 2020-07-07 ENCOUNTER — Other Ambulatory Visit: Payer: Self-pay

## 2020-07-07 DIAGNOSIS — I639 Cerebral infarction, unspecified: Secondary | ICD-10-CM

## 2020-07-07 DIAGNOSIS — E782 Mixed hyperlipidemia: Secondary | ICD-10-CM

## 2020-07-07 DIAGNOSIS — Z8739 Personal history of other diseases of the musculoskeletal system and connective tissue: Secondary | ICD-10-CM

## 2020-07-07 NOTE — Progress Notes (Signed)
Pt's wife called requesting lab order for pt. Per pt's wife - pt was due to recheck his cholesterol. Last order for labs expired. New order placed. Left a detailed vm msg for pt. Informed of [facility] hours/schedule. Direct call back info provided.

## 2020-09-09 ENCOUNTER — Institutional Professional Consult (permissible substitution): Payer: 59 | Admitting: Pulmonary Disease

## 2020-09-23 ENCOUNTER — Institutional Professional Consult (permissible substitution): Payer: 59 | Admitting: Pulmonary Disease

## 2021-03-22 ENCOUNTER — Ambulatory Visit: Payer: 59 | Admitting: Family Medicine

## 2021-04-13 NOTE — Progress Notes (Signed)
Acute Office Visit  Subjective:    Patient ID: Brandon Thomas, male    DOB: 1975/10/20, 45 y.o.   MRN: [id number]  Chief Complaint  Patient presents with   Depression   Anxiety    HPI Patient is in today for anxiety.  He reports a history of anxiety, but stopped meds a few years ago because he didn't like how they made him feel. Admits that the only thing that really seemed to help him was narcotics, but he became dependent and knows that was not good. States that over the past few years his life has become very hectic and stressful. He is running a new company, reports lack of pleasure and constant anxious state. States he feels like his mind and heart are constantly racing. He has no desire to do anything other than sleep or binge on TV to procrastinate dealing with work things. He believes that he sleeps alright at night, however he always feels run down, so maybe not. Reports he his eating normally. States he does have a good support system in his family and friends but he is often traveling for work, kids are either at school or wife is traveling with them for travel sports. States he often feels very lonely. He also reports social anxiety with his work and presentations - states events like that make him feel very anxious and like his heart is going to beat out of his chest. States that he thinks his resting heart rate is always 90-100s.   Reports he tried counseling in the past but didn't really find it helpful.  He has tried Wellbutrin and Zoloft but he felt like it made him feel weird, caused insomnia so he never felt well rested. He felt like it made him more forgetful. States it has been awhile since he has been on anxiety meds so he is willing to try anything.     Past Medical History:  Diagnosis Date   Depression    Well treated without meds currently   Elevated LFTs 08/20/2013   Hyperlipidemia 08/20/2013    Past Surgical History:  Procedure Laterality Date    WISDOM TOOTH EXTRACTION      Family History  Adopted: Yes  Family history unknown: Yes    Social History   Socioeconomic History   Marital status: Married    Spouse name: Not on file   Number of children: 2   Years of education: Not on file   Highest education level: Not on file  Occupational History   Occupation: sales  Tobacco Use   Smoking status: Never   Smokeless tobacco: Current    Types: Chew  Vaping Use   Vaping Use: Never used  Substance and Sexual Activity   Alcohol use: Not Currently   Drug use: Not Currently   Sexual activity: Yes    Partners: Female  Other Topics Concern   Not on file  Social History Narrative   Two children ages 62 and 26.  Happily married x 9 years.     Social Determinants of Health   Financial Resource Strain: Not on file  Food Insecurity: Not on file  Transportation Needs: Not on file  Physical Activity: Not on file  Stress: Not on file  Social Connections: Not on file  Intimate Partner Violence: Not on file    Outpatient Medications Prior to Visit  Medication Sig Dispense Refill   Misc. Devices MISC Start CPAP therapy at 15 cm water pressure.  CPAP machine  with a mask and supplies for OSA.  ResMed AirFit P30i medium pillow mask or patient preference.  Send to a local DME     albuterol (VENTOLIN HFA) 108 (90 Base) MCG/ACT inhaler Inhale into the lungs.     benzonatate (TESSALON) 100 MG capsule Take 1-2 capsules (100-200 mg total) by mouth 3 (three) times daily as needed for cough. 40 capsule 0   doxycycline (VIBRAMYCIN) 100 MG capsule Take 100 mg by mouth 2 (two) times daily.     methylPREDNISolone (MEDROL DOSEPAK) 4 MG TBPK tablet 5-4-3-2-1 daily dose 15 tablet 0   No facility-administered medications prior to visit.    Allergies  Allergen Reactions   Atorvastatin Other (See Comments)    RUQ pain   Crestor [Rosuvastatin] Other (See Comments)    Elevated LFTs   Penicillins     Other reaction(s): Other (See  Comments) Reaction unknown. Happened in childhood Other reaction(s): Other (See Comments) Reaction unknown. Happened in childhood Other reaction(s): Other (See Comments) Reaction unknown. Happened in childhood    Review of Systems All review of systems negative except what is listed in the HPI     Objective:    Physical Exam Vitals reviewed.  Constitutional:      Appearance: Normal appearance. He is normal weight.  Cardiovascular:     Rate and Rhythm: Normal rate and regular rhythm.     Pulses: Normal pulses.     Heart sounds: Normal heart sounds.  Pulmonary:     Effort: Pulmonary effort is normal.     Breath sounds: Normal breath sounds.  Neurological:     General: No focal deficit present.     Mental Status: He is alert and oriented to person, place, and time. Mental status is at baseline.  Psychiatric:        Mood and Affect: Mood normal.        Behavior: Behavior normal.        Thought Content: Thought content normal.        Judgment: Judgment normal.    BP 116/81 (BP Location: Left Arm, Patient Position: Sitting, Cuff Size: Large)   Pulse 90   Temp 99 F (37.2 C) (Oral)   Ht 5' 11.5" (1.816 m)   Wt 212 lb 1.9 oz (96.2 kg)   SpO2 96%   BMI 29.17 kg/m  Wt Readings from Last 3 Encounters:  04/14/21 212 lb 1.9 oz (96.2 kg)  03/30/20 220 lb (99.8 kg)  03/10/20 220 lb 9.6 oz (100.1 kg)    Health Maintenance Due  Topic Date Due   COLONOSCOPY (Pts 45-50yr Insurance coverage will need to be confirmed)  Never done    There are no preventive care reminders to display for this patient.   No results found for: TSH Lab Results  Component Value Date   WBC 8.3 04/14/2019   HGB 15.7 04/14/2019   HCT 48.5 04/14/2019   MCV 89.0 04/14/2019   PLT 484 (H) 04/14/2019   Lab Results  Component Value Date   NA 140 04/14/2019   K 4.4 04/14/2019   CO2 25 04/14/2019   GLUCOSE 94 04/14/2019   BUN 19 04/14/2019   CREATININE 1.10 04/14/2019   BILITOT 0.6 04/10/2019    ALKPHOS 56 04/10/2019   AST 62 (H) 04/10/2019   ALT 165 (H) 04/10/2019   PROT 6.0 (L) 04/10/2019   ALBUMIN 3.3 (L) 04/10/2019   CALCIUM 9.4 04/14/2019   ANIONGAP 11 04/14/2019   Lab Results  Component Value Date  CHOL 254 (H) 03/25/2015   Lab Results  Component Value Date   HDL 34 (L) 03/25/2015   Lab Results  Component Value Date   LDLCALC 186 (H) 03/25/2015   Lab Results  Component Value Date   TRIG 79 04/03/2019   Lab Results  Component Value Date   CHOLHDL 7.5 (H) 03/25/2015   Lab Results  Component Value Date   HGBA1C 5.1 04/04/2019       Assessment & Plan:   1. Need for influenza vaccination - Flu Vaccine QUAD 6+ mos PF IM (Fluarix Quad PF)  2. Screening for colon cancer - Cologuard  3. Anxiety and depression Start with daily Effexor. This is the lowest dose, so we have room to adjust after we see how you like it and if you have any side effects.  Adding hydroxyzine as needed for anxiety - this one could make you drowsy so it might be better to save this for when you are at home. Also adding propranolol as needed for social anxiety (public speaking, etc). Take 0.5-1 tablet 30-60 minutes before anxiety-provoking event.  Medication handouts attached to AVS  Effexor: Taking the medicine as directed and not missing any doses is one of the best things you can do to treat your anxiety/depression.  Here are some things to keep in mind: Side effects (stomach upset, some increased anxiety) may happen before you notice a benefit.  These side effects typically go away over time. Changes to your dose of medicine or a change in medication all together is sometimes necessary Many people will notice an improvement within two weeks but the full effect of the medication can take up to 4-6 weeks Stopping the medication when you start feeling better often results in a return of symptoms. Most people need to be on medication at least 6-12 months If you start having thoughts  of hurting yourself or others after starting this medicine, please call me immediately.     - venlafaxine XR (EFFEXOR XR) 37.5 MG 24 hr capsule; Take 1 capsule (37.5 mg total) by mouth daily with breakfast.  Dispense: 30 capsule; Refill: 2 - hydrOXYzine (VISTARIL) 25 MG capsule; Take 1 capsule (25 mg total) by mouth every 8 (eight) hours as needed for anxiety. May make you drowsy. Start with bedtime as needed use  Dispense: 30 capsule; Refill: 0 - propranolol (INDERAL) 20 MG tablet; Take 0.5-1 tablets (10-20 mg total) by mouth daily as needed (anxiety).  Dispense: 30 tablet; Refill: 1   Follow-up in 4-6 weeks or sooner if needed.   Purcell Nails Olevia Bowens, DNP, FNP-C

## 2021-04-14 ENCOUNTER — Ambulatory Visit: Payer: 59 | Admitting: Family Medicine

## 2021-04-14 ENCOUNTER — Encounter: Payer: Self-pay | Admitting: Family Medicine

## 2021-04-14 ENCOUNTER — Other Ambulatory Visit: Payer: Self-pay

## 2021-04-14 VITALS — BP 116/81 | HR 90 | Temp 99.0°F | Ht 71.5 in | Wt 212.1 lb

## 2021-04-14 DIAGNOSIS — F419 Anxiety disorder, unspecified: Secondary | ICD-10-CM

## 2021-04-14 DIAGNOSIS — F32A Depression, unspecified: Secondary | ICD-10-CM | POA: Diagnosis not present

## 2021-04-14 DIAGNOSIS — Z23 Encounter for immunization: Secondary | ICD-10-CM

## 2021-04-14 DIAGNOSIS — Z1211 Encounter for screening for malignant neoplasm of colon: Secondary | ICD-10-CM

## 2021-04-14 MED ORDER — PROPRANOLOL HCL 20 MG PO TABS: 10.0000 mg | ORAL_TABLET | Freq: Every day | ORAL | 1 refills | Status: DC | PRN

## 2021-04-14 MED ORDER — HYDROXYZINE PAMOATE 25 MG PO CAPS: 25.0000 mg | ORAL_CAPSULE | Freq: Three times a day (TID) | ORAL | 0 refills | Status: DC | PRN

## 2021-04-14 MED ORDER — VENLAFAXINE HCL ER 37.5 MG PO CP24: 37.5000 mg | ORAL_CAPSULE | Freq: Every day | ORAL | 2 refills | Status: DC

## 2021-04-14 NOTE — Patient Instructions (Signed)
Start with daily Effexor. This is the lowest dose, so we have room to adjust after we see how you like it and if you have any side effects.  Adding hydroxyzine as needed for anxiety - this one could make you drowsy so it might be better to save this for when you are at home. Also adding propranolol as needed for social anxiety (public speaking, etc). Take 0.5-1 tablet 30-60 minutes before anxiety-provoking event.   Taking the medicine as directed and not missing any doses is one of the best things you can do to treat your anxiety/depression.  Here are some things to keep in mind: Side effects (stomach upset, some increased anxiety) may happen before you notice a benefit.  These side effects typically go away over time. Changes to your dose of medicine or a change in medication all together is sometimes necessary Many people will notice an improvement within two weeks but the full effect of the medication can take up to 4-6 weeks Stopping the medication when you start feeling better often results in a return of symptoms. Most people need to be on medication at least 6-12 months If you start having thoughts of hurting yourself or others after starting this medicine, please call me immediately.

## 2021-04-24 LAB — COLOGUARD: COLOGUARD: NEGATIVE

## 2021-05-06 ENCOUNTER — Other Ambulatory Visit: Payer: Self-pay | Admitting: Family Medicine

## 2021-05-06 DIAGNOSIS — F419 Anxiety disorder, unspecified: Secondary | ICD-10-CM

## 2021-05-16 ENCOUNTER — Ambulatory Visit: Payer: 59 | Admitting: Family Medicine

## 2021-05-16 IMAGING — DX DG CHEST 2V
2 series · 2 of 2 positions shown · non-contrast
Comparison: 04/03/2020.  04/04/2019.  04/02/2019.

CLINICAL DATA: Chronic cough.

EXAM:
CHEST - 2 VIEW

[chest pa]
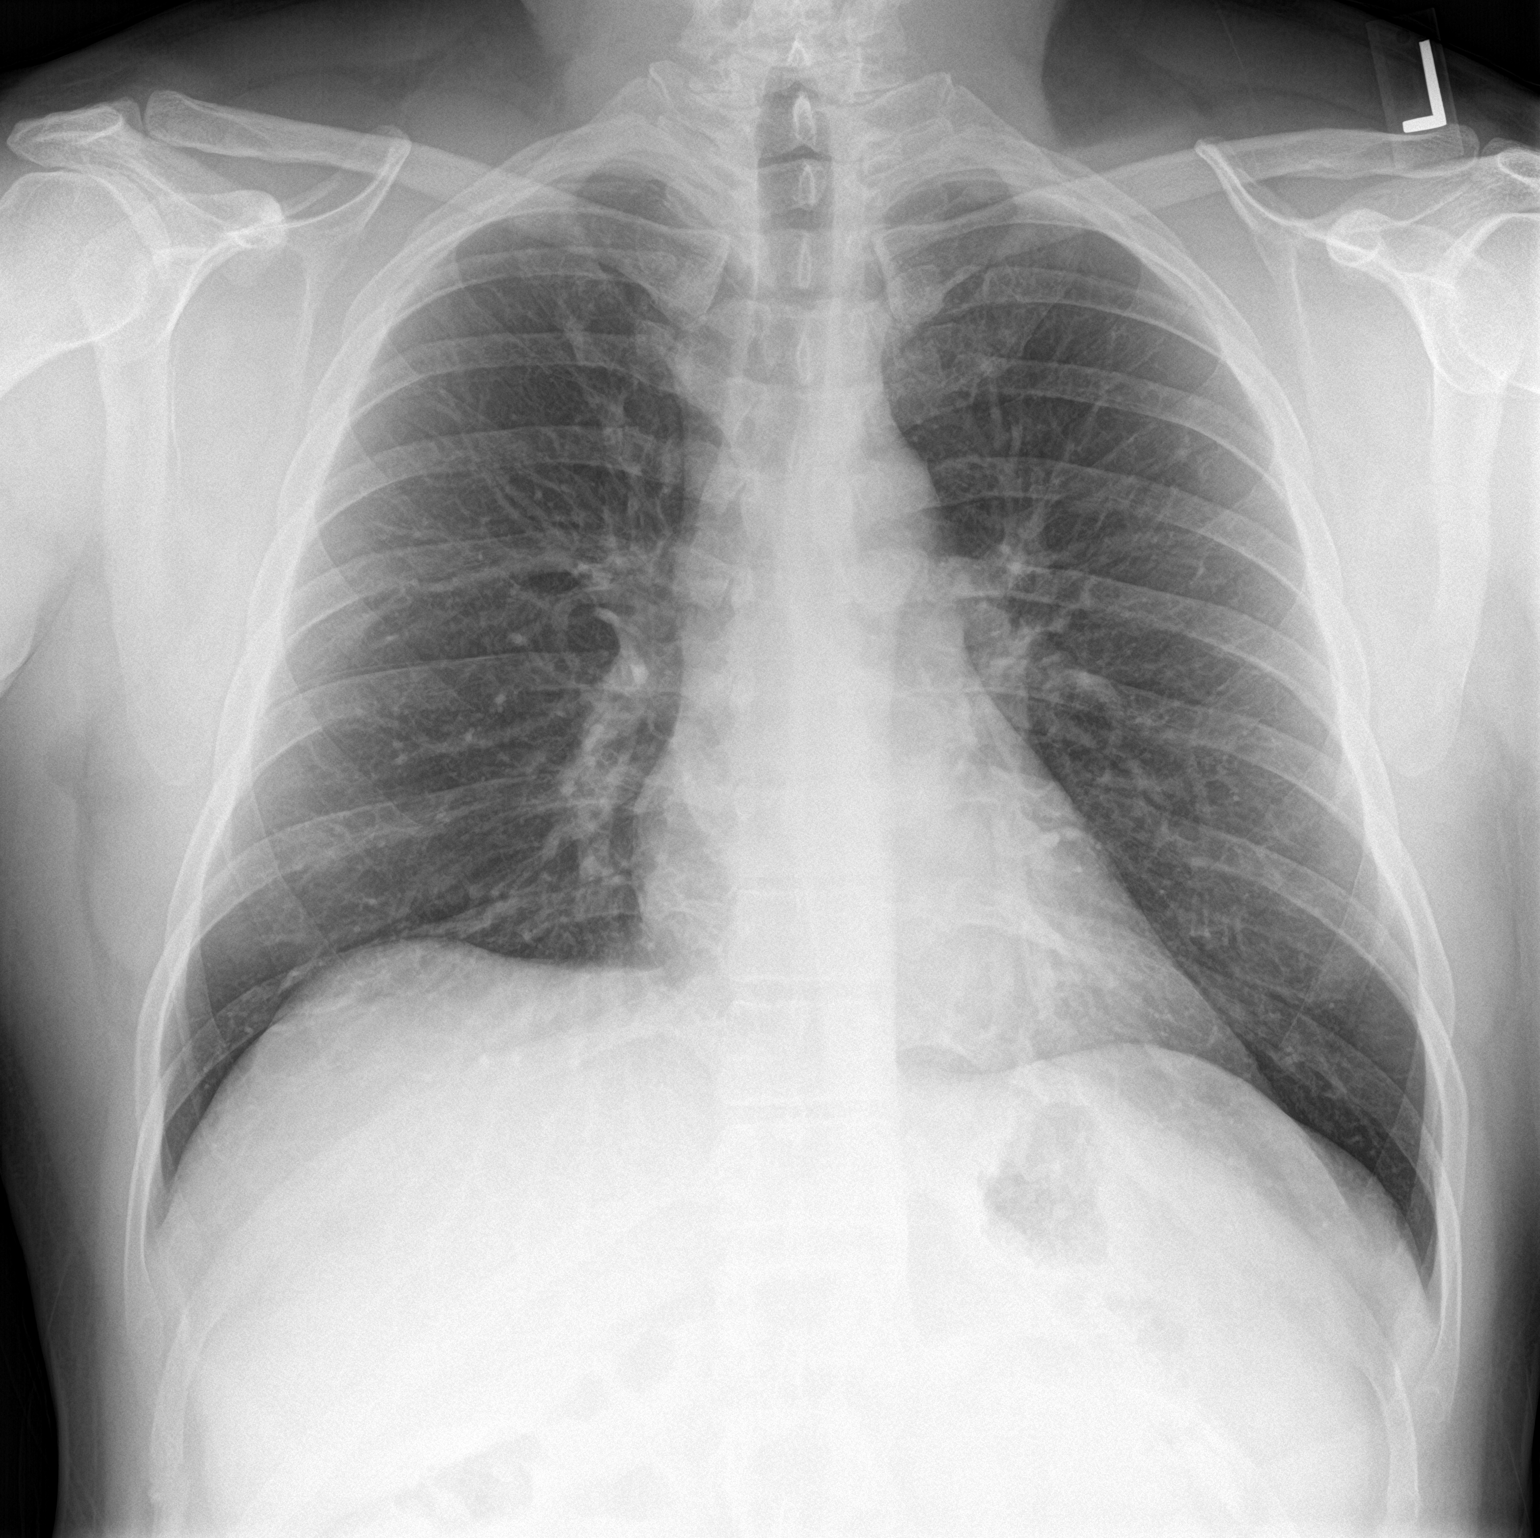

[chest lat]
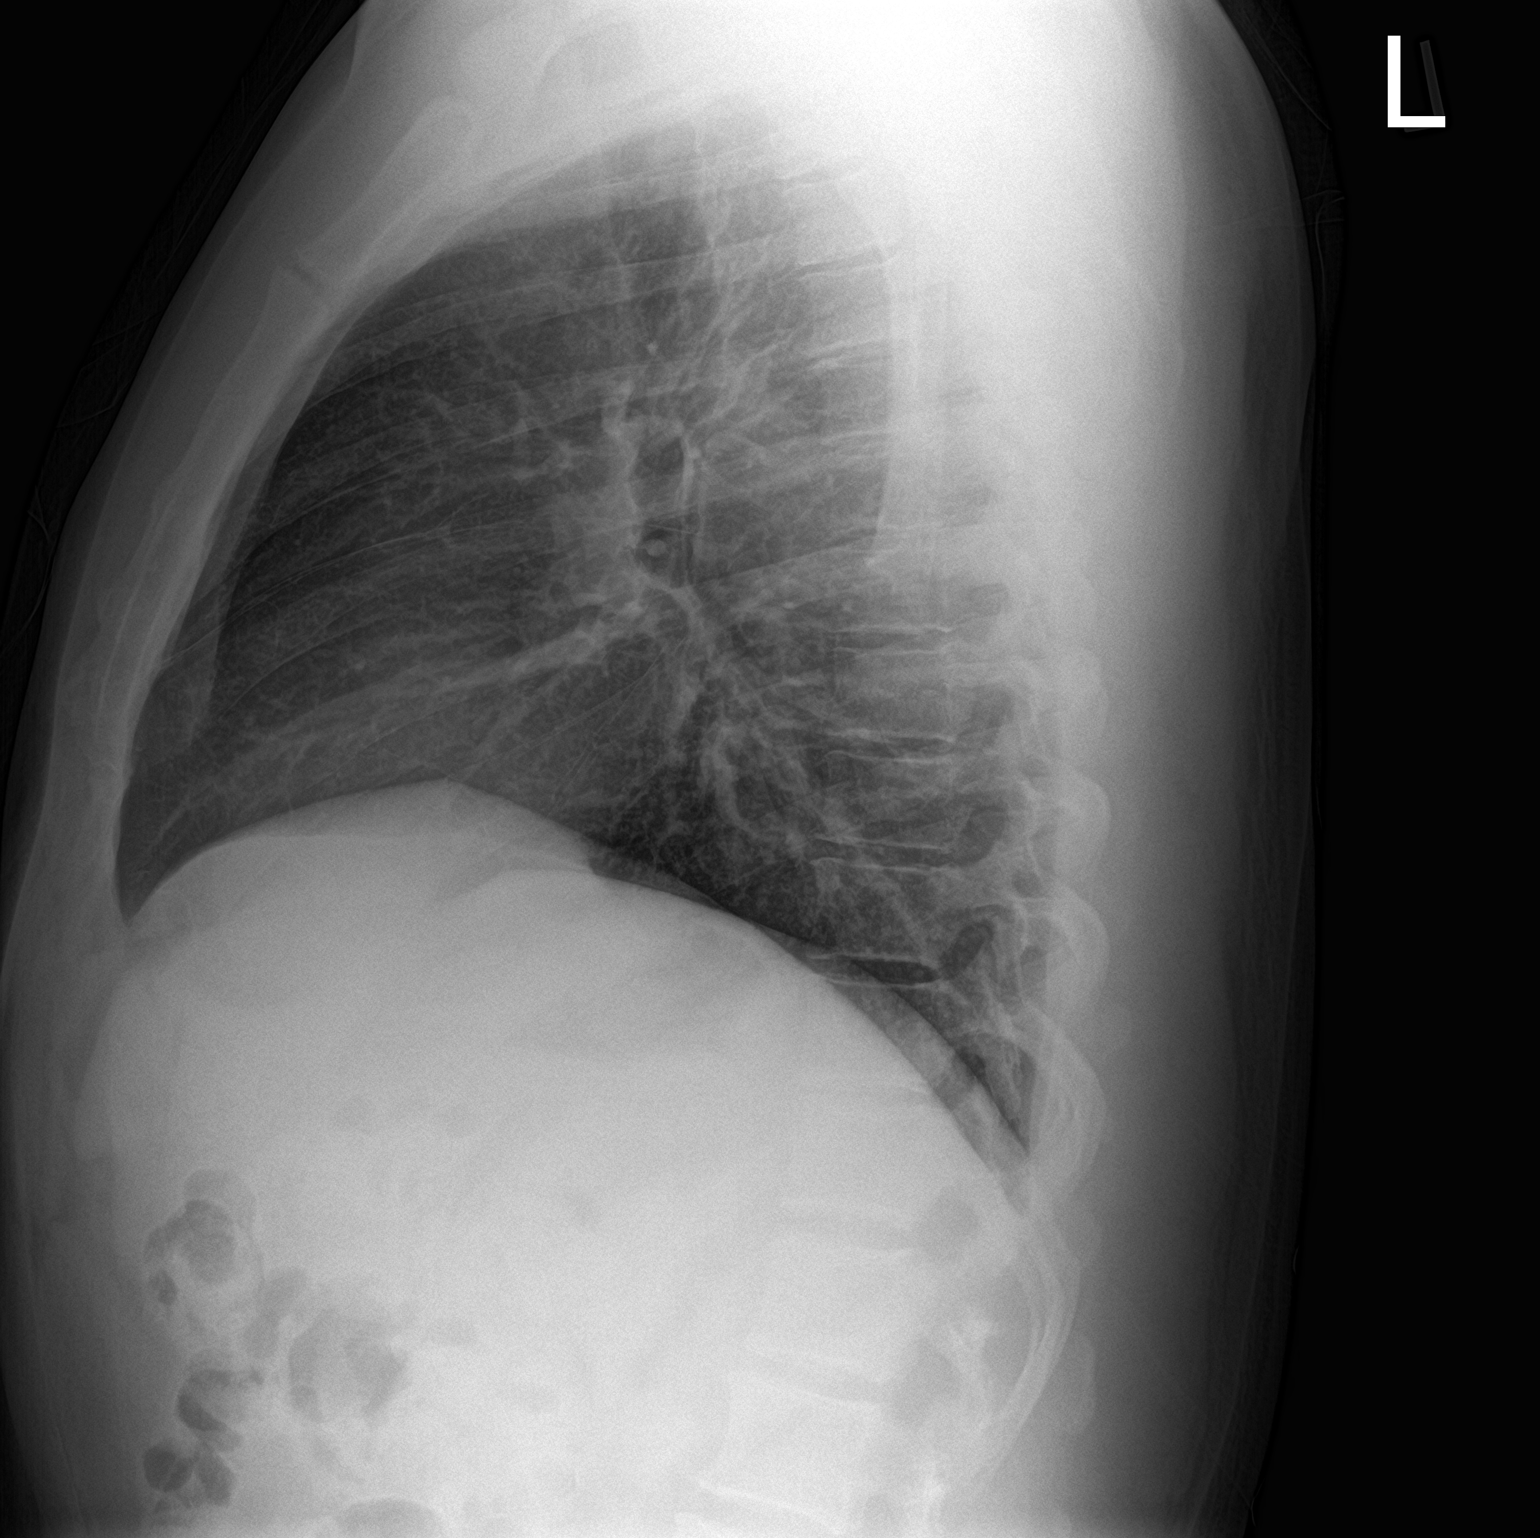

[2 of 2 positions shown; findings below may reference images not displayed]

FINDINGS: Mediastinum and hilar structures normal. Heart size normal. No focal
infiltrate. No pleural effusion or pneumothorax. No acute bony
abnormality.
IMPRESSION: No acute cardiopulmonary disease.

## 2021-05-17 ENCOUNTER — Ambulatory Visit: Payer: 59 | Admitting: Family Medicine

## 2021-07-04 ENCOUNTER — Encounter: Payer: Self-pay | Admitting: Pulmonary Disease

## 2021-07-04 ENCOUNTER — Ambulatory Visit: Payer: 59 | Admitting: Pulmonary Disease

## 2021-07-04 ENCOUNTER — Other Ambulatory Visit: Payer: Self-pay

## 2021-07-04 VITALS — BP 118/70 | HR 102 | Temp 98.1°F | Ht 71.0 in | Wt 220.4 lb

## 2021-07-04 DIAGNOSIS — R059 Cough, unspecified: Secondary | ICD-10-CM | POA: Diagnosis not present

## 2021-07-04 DIAGNOSIS — G4733 Obstructive sleep apnea (adult) (pediatric): Secondary | ICD-10-CM

## 2021-07-04 MED ORDER — BUDESONIDE-FORMOTEROL FUMARATE 160-4.5 MCG/ACT IN AERO: 2.0000 | INHALATION_SPRAY | Freq: Two times a day (BID) | RESPIRATORY_TRACT | 5 refills | Status: DC

## 2021-07-04 NOTE — Patient Instructions (Signed)
We will get CBC differential, IgE Start Symbicort 160 Schedule PFTs and follow-up in clinic at next available

## 2021-07-04 NOTE — Progress Notes (Signed)
Brandon Thomas    [id number]    05/09/1975  Primary Care Physician:Alexander, Lanelle Bal, DO  Referring Physician: Emeterio Reeve, [address] [place],  [place] [address]  Chief complaint: Consult for chronic cough  HPI: 46 year old with history of hyperlipidemia, stroke, allergies, sleep apnea Complains of chronic cough for the past 1 year.  Cough is nonproductive in nature, with no particular aggravating or relieving factors.  He has history of recurrent bronchitis and allergies  Pets: 3 dogs Occupation: Tree surgeon for an [employer] Exposures: No known exposures.  No mold, hot tub, Jacuzzi.  No feather pillows or comforter Smoking history: Social smoker.  Quit in 2016 Travel history: No significant travel history Relevant family history: No family history of lung disease   Outpatient Encounter Medications as of 07/04/2021  Medication Sig   albuterol (VENTOLIN HFA) 108 (90 Base) MCG/ACT inhaler Inhale into the lungs.   Misc. Devices MISC Start CPAP therapy at 15 cm water pressure.  CPAP machine with a mask and supplies for OSA.  ResMed AirFit P30i medium pillow mask or patient preference.  Send to a local DME (Patient not taking: Reported on 07/04/2021)   [DISCONTINUED] benzonatate (TESSALON) 100 MG capsule Take 1-2 capsules (100-200 mg total) by mouth 3 (three) times daily as needed for cough. (Patient not taking: Reported on 07/04/2021)   [DISCONTINUED] doxycycline (VIBRAMYCIN) 100 MG capsule Take 100 mg by mouth 2 (two) times daily.   [DISCONTINUED] hydrOXYzine (VISTARIL) 25 MG capsule Take 1 capsule (25 mg total) by mouth every 8 (eight) hours as needed for anxiety. May make you drowsy. Start with bedtime as needed use   [DISCONTINUED] methylPREDNISolone (MEDROL DOSEPAK) 4 MG TBPK tablet 5-4-3-2-1 daily dose   [DISCONTINUED] propranolol (INDERAL) 20 MG tablet Take 0.5-1 tablets (10-20 mg total) by mouth daily as needed (anxiety).    [DISCONTINUED] sertraline (ZOLOFT) 100 MG tablet Take 1 tablet (100 mg total) by mouth daily. (Patient not taking: Reported on 03/10/2020)   [DISCONTINUED] venlafaxine XR (EFFEXOR-XR) 37.5 MG 24 hr capsule TAKE 1 CAPSULE BY MOUTH DAILY WITH BREAKFAST.   No facility-administered encounter medications on file as of 07/04/2021.    Allergies as of 07/04/2021 - Review Complete 07/04/2021  Allergen Reaction Noted   Atorvastatin Other (See Comments) 03/26/2015   Crestor [rosuvastatin] Other (See Comments) 09/08/2013   Penicillins  04/28/2019    Past Medical History:  Diagnosis Date   Depression    Well treated without meds currently   Elevated LFTs 08/20/2013   Hyperlipidemia 08/20/2013    Past Surgical History:  Procedure Laterality Date   WISDOM TOOTH EXTRACTION      Family History  Adopted: Yes  Family history unknown: Yes    Social History   Socioeconomic History   Marital status: Married    Spouse name: Not on file   Number of children: 2   Years of education: Not on file   Highest education level: Not on file  Occupational History   Occupation: sales  Tobacco Use   Smoking status: Never    Passive exposure: Past   Smokeless tobacco: Current    Types: Chew  Vaping Use   Vaping Use: Never used  Substance and Sexual Activity   Alcohol use: Not Currently   Drug use: Not Currently   Sexual activity: Yes    Partners: Female  Other Topics Concern   Not on file  Social History Narrative   Two children  ages 68 and 22.  Happily married x 9 years.     Social Determinants of Health   Financial Resource Strain: Not on file  Food Insecurity: Not on file  Transportation Needs: Not on file  Physical Activity: Not on file  Stress: Not on file  Social Connections: Not on file  Intimate Partner Violence: Not on file    Review of systems: Review of Systems  Constitutional: Negative for fever and chills.  HENT: Negative.   Eyes: Negative for blurred vision.   Respiratory: as per HPI  Cardiovascular: Negative for chest pain and palpitations.  Gastrointestinal: Negative for vomiting, diarrhea, blood per rectum. Genitourinary: Negative for dysuria, urgency, frequency and hematuria.  Musculoskeletal: Negative for myalgias, back pain and joint pain.  Skin: Negative for itching and rash.  Neurological: Negative for dizziness, tremors, focal weakness, seizures and loss of consciousness.  Endo/Heme/Allergies: Negative for environmental allergies.  Psychiatric/Behavioral: Negative for depression, suicidal ideas and hallucinations.  All other systems reviewed and are negative.  Physical Exam: Blood pressure 118/70, pulse (!) 102, temperature 98.1 F (36.7 C), temperature source Oral, height 5' 11"  (1.803 m), weight 220 lb 6.4 oz (100 kg), SpO2 99 %. Gen:      No acute distress HEENT:  EOMI, sclera anicteric Neck:     No masses; no thyromegaly Lungs:    Clear to auscultation bilaterally; normal respiratory effort CV:         Regular rate and rhythm; no murmurs Abd:      + bowel sounds; soft, non-tender; no palpable masses, no distension Ext:    No edema; adequate peripheral perfusion Skin:      Warm and dry; no rash Neuro: alert and oriented x 3 Psych: normal mood and affect  Data Reviewed: Imaging: CXR [facility] 03/06/21 No active cardiopulmonary disease.    PFTs:  Labs:  Sleep PSG 12/16/19 Study showed moderate OSA and severe snoring. Patient met split protocol and was placed on CPAP starting at 5cm. CPAP was increased to 15cm with good results. Patient tolerated CPAP mask and pressures well. The ResMed AirFit P30i medium pillow mask was used during the titration.     Assessment:  Consult for chronic cough, dyspnea  May have reactive airway disease given history of recurrent bronchitis, allergies. We will also need evaluation for COPD as he smoked in the past though the number of cigarettes he smoked was not high We will get PFTs Check  CBC with differential, IgE for baseline evaluation Start Symbicort 160  OSA On CPAP.  He is compliant with therapy.  Review download on return visit  Plan/Recommendations: CBC, IgE Symbicort PFTs  Marshell Garfinkel MD [facility] 07/04/2021, 3:36 PM  CC: Emeterio Reeve, DO

## 2021-07-05 ENCOUNTER — Telehealth: Payer: Self-pay | Admitting: Pulmonary Disease

## 2021-07-05 LAB — CBC WITH DIFFERENTIAL/PLATELET
Basophils Absolute: 0.1 10*3/uL (ref 0.0–0.1)
Basophils Relative: 1 % (ref 0.0–3.0)
Eosinophils Absolute: 0.1 10*3/uL (ref 0.0–0.7)
Eosinophils Relative: 1.9 % (ref 0.0–5.0)
HCT: 50.3 % (ref 39.0–52.0)
Hemoglobin: 16.6 g/dL (ref 13.0–17.0)
Lymphocytes Relative: 30.1 % (ref 12.0–46.0)
Lymphs Abs: 2 10*3/uL (ref 0.7–4.0)
MCHC: 33 g/dL (ref 30.0–36.0)
MCV: 83.5 fl (ref 78.0–100.0)
Monocytes Absolute: 0.5 10*3/uL (ref 0.1–1.0)
Monocytes Relative: 6.8 % (ref 3.0–12.0)
Neutro Abs: 4 10*3/uL (ref 1.4–7.7)
Neutrophils Relative %: 60.2 % (ref 43.0–77.0)
Platelets: 309 10*3/uL (ref 150.0–400.0)
RBC: 6.02 Mil/uL — ABNORMAL HIGH (ref 4.22–5.81)
RDW: 15.8 % — ABNORMAL HIGH (ref 11.5–15.5)
WBC: 6.6 10*3/uL (ref 4.0–10.5)

## 2021-07-05 LAB — IGE: IgE (Immunoglobulin E), Serum: 23 kU/L (ref <=114)

## 2021-07-05 MED ORDER — BUDESONIDE-FORMOTEROL FUMARATE 160-4.5 MCG/ACT IN AERO: 2.0000 | INHALATION_SPRAY | Freq: Two times a day (BID) | RESPIRATORY_TRACT | 5 refills | Status: DC

## 2021-07-05 NOTE — Telephone Encounter (Signed)
Spoke to patient. He stated that [facility] is no longer in network with his insurance.  Symbicort sent to [facility] as requested.  Nothing further needed.

## 2021-07-07 ENCOUNTER — Encounter: Payer: Self-pay | Admitting: Pulmonary Disease

## 2021-07-07 DIAGNOSIS — G4733 Obstructive sleep apnea (adult) (pediatric): Secondary | ICD-10-CM | POA: Insufficient documentation

## 2021-08-30 ENCOUNTER — Ambulatory Visit: Payer: 59 | Admitting: Pulmonary Disease

## 2021-08-30 ENCOUNTER — Encounter: Payer: Self-pay | Admitting: Pulmonary Disease

## 2021-08-30 ENCOUNTER — Ambulatory Visit (INDEPENDENT_AMBULATORY_CARE_PROVIDER_SITE_OTHER): Payer: 59 | Admitting: Pulmonary Disease

## 2021-08-30 VITALS — BP 124/62 | HR 92 | Temp 97.9°F | Ht 73.0 in | Wt 211.4 lb

## 2021-08-30 DIAGNOSIS — R059 Cough, unspecified: Secondary | ICD-10-CM

## 2021-08-30 DIAGNOSIS — J4530 Mild persistent asthma, uncomplicated: Secondary | ICD-10-CM

## 2021-08-30 LAB — PULMONARY FUNCTION TEST
DL/VA % pred: 132 %
DL/VA: 5.92 ml/min/mmHg/L
DLCO cor % pred: 140 %
DLCO cor: 46.17 ml/min/mmHg
DLCO unc % pred: 140 %
DLCO unc: 46.17 ml/min/mmHg
FEF 25-75 Post: 4.94 L/sec
FEF 25-75 Pre: 5.25 L/sec
FEF2575-%Change-Post: -5 %
FEF2575-%Pred-Post: 124 %
FEF2575-%Pred-Pre: 132 %
FEV1-%Change-Post: 0 %
FEV1-%Pred-Post: 102 %
FEV1-%Pred-Pre: 101 %
FEV1-Post: 4.53 L
FEV1-Pre: 4.52 L
FEV1FVC-%Change-Post: 0 %
FEV1FVC-%Pred-Pre: 107 %
FEV6-%Change-Post: 0 %
FEV6-%Pred-Post: 96 %
FEV6-%Pred-Pre: 97 %
FEV6-Post: 5.33 L
FEV6-Pre: 5.35 L
FEV6FVC-%Pred-Post: 103 %
FEV6FVC-%Pred-Pre: 103 %
FVC-%Change-Post: 0 %
FVC-%Pred-Post: 93 %
FVC-%Pred-Pre: 94 %
FVC-Post: 5.33 L
FVC-Pre: 5.35 L
Post FEV1/FVC ratio: 85 %
Post FEV6/FVC ratio: 100 %
Pre FEV1/FVC ratio: 85 %
Pre FEV6/FVC Ratio: 100 %
RV % pred: 92 %
RV: 1.95 L
TLC % pred: 108 %
TLC: 8.17 L

## 2021-08-30 MED ORDER — BUDESONIDE-FORMOTEROL FUMARATE 160-4.5 MCG/ACT IN AERO: 2.0000 | INHALATION_SPRAY | Freq: Two times a day (BID) | RESPIRATORY_TRACT | 5 refills | Status: DC

## 2021-08-30 NOTE — Patient Instructions (Signed)
Full PFT performed today. °

## 2021-08-30 NOTE — Progress Notes (Signed)
Full PFT performed today. °

## 2021-08-30 NOTE — Patient Instructions (Signed)
I reviewed your lung function test which shows normal lung function but there is subtle changes which do indicate asthma. ?We will continue the Symbicort we will call in a refill ?Follow-up in 6 months ?

## 2021-08-30 NOTE — Progress Notes (Signed)
? ?      ?Brandon Thomas    [id number]    [birth year] ? ?Primary Care Physician:Alexander, Lanelle Bal, DO ? ?Referring Physician: Emeterio Reeve, DO ?[address] ?[address] ?[place],  [place] [address] ? ?Chief complaint: Follow-up for chronic cough ? ?HPI: ?46 year old with history of hyperlipidemia, stroke, allergies, sleep apnea ?Complains of chronic cough for the past 1 year.  Cough is nonproductive in nature, with no particular aggravating or relieving factors.  He has history of recurrent bronchitis and allergies ? 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  No mold, hot tub, Jacuzzi.  No feather pillows or comforter ?Smoking history: Social smoker.  Quit in 2016 ?Travel history: No significant travel history ?Relevant family history: No family history of lung disease ? ? ?Outpatient Encounter Medications as of 08/30/2021  ?Medication Sig  ? albuterol (VENTOLIN HFA) 108 (90 Base) MCG/ACT inhaler Inhale into the lungs.  ? budesonide-formoterol (SYMBICORT) 160-4.5 MCG/ACT inhaler Inhale 2 puffs into the lungs in the morning and at bedtime.  ? Misc. Devices MISC   ? [DISCONTINUED] sertraline (ZOLOFT) 100 MG tablet Take 1 tablet (100 mg total) by mouth daily. (Patient not taking: Reported on 03/10/2020)  ? ?No facility-administered encounter medications on file as of 08/30/2021.  ? ? ?Allergies as of 08/30/2021 - Review Complete 08/30/2021  ?Allergen Reaction Noted  ? Atorvastatin Other (See Comments) 03/26/2015  ? Crestor [rosuvastatin] Other (See Comments) 09/08/2013  ? Penicillins  04/28/2019  ? ? ?Past Medical History:  ?Diagnosis Date  ? Depression   ? Well treated without meds currently  ? Elevated LFTs 08/20/2013  ? Hyperlipidemia 08/20/2013  ? ? ?Past Surgical History:  ?Procedure Laterality Date  ? WISDOM TOOTH EXTRACTION    ? ? ?Family History  ?Adopted: Yes  ?Family history unknown: Yes  ? ? ?Social History  ? ?Socioeconomic History   ? Marital status: Married  ?  Spouse name: Not on file  ? Number of children: 2  ? Years of education: Not on file  ? Highest education level: Not on file  ?Occupational History  ? Occupation: Press photographer  ?Tobacco Use  ? Smoking status: Never  ?  Passive exposure: Past  ? Smokeless tobacco: Current  ?  Types: Chew  ?Vaping Use  ? Vaping Use: Never used  ?Substance and Sexual Activity  ? Alcohol use: Not Currently  ? Drug use: Not Currently  ? Sexual activity: Yes  ?  Partners: Female  ?Other Topics Concern  ? Not on file  ?Social History Narrative  ? Two children ages 36 and 67.  Happily married x 9 years.    ? ?Social Determinants of Health  ? ?Financial Resource Strain: Not on file  ?Food Insecurity: Not on file  ?Transportation Needs: Not on file  ?Physical Activity: Not on file  ?Stress: Not on file  ?Social Connections: Not on file  ?Intimate Partner Violence: Not on file  ? ? ?Review of systems: ?Review of Systems  ?Constitutional: Negative for fever and chills.  ?HENT: Negative.   ?Eyes: Negative for blurred vision.  ?Respiratory: as per HPI  ?Cardiovascular: Negative for chest pain and palpitations.  ?Gastrointestinal: Negative for vomiting, diarrhea, blood per rectum. ?Genitourinary: Negative for dysuria, urgency, frequency and hematuria.  ?Musculoskeletal: Negative for myalgias, back pain and joint pain.  ?Skin: Negative for itching and rash.  ?Neurological: Negative for dizziness, tremors, focal weakness, seizures and loss of consciousness.  ?Endo/Heme/Allergies: Negative for environmental  allergies.  ?Psychiatric/Behavioral: Negative for depression, suicidal ideas and hallucinations.  ?All other systems reviewed and are negative. ? ?Physical Exam: ?Blood pressure 118/70, pulse (!) 102, temperature 98.1 ?F (36.7 ?C), temperature source Oral, height 5' 11"  (1.803 m), weight 220 lb 6.4 oz (100 kg), SpO2 99 %. ?Gen:      No acute distress ?HEENT:  EOMI, sclera anicteric ?Neck:     No masses; no thyromegaly ?Lungs:     Clear to auscultation bilaterally; normal respiratory effort ?CV:         Regular rate and rhythm; no murmurs ?Abd:      + bowel sounds; soft, non-tender; no palpable masses, no distension ?Ext:    No edema; adequate peripheral perfusion ?Skin:      Warm and dry; no rash ?Neuro: alert and oriented x 3 ?Psych: normal mood and affect ? ?Data Reviewed: ?Imaging: ?CXR [facility] 03/06/21 ?No active cardiopulmonary disease.  ? ?PFTs: ?08/30/2021 ?FVC 5.33 [93%], FEV1 4.53 [102%], F/F 85, TLC 8.17 [108%], DLCO 46.17 [140%] ?No obstruction, air trapping and increased diffusion capacity ? ?Labs: ?CBC 07/04/2021- WBC 6.6, eos 1.9%, absolute eosinophil count 125 ?IgE 07/04/2021- 23 ? ?Sleep ?PSG 12/16/19 ?Study showed moderate OSA and severe snoring. Patient met split protocol and was placed on CPAP starting at 5cm. CPAP was increased to 15cm with good results. Patient tolerated CPAP mask and pressures well. The ResMed AirFit P30i medium pillow mask was used during the titration.   ? ? ?Assessment:  ?Consult for chronic cough, dyspnea  ?May have reactive airway disease given history of recurrent bronchitis, allergies. ?Though there is no obstruction on PFTs he does have mild trapping and increased diffusion capacity suggestive of asthma.  There is no evidence of peripheral eosinophilia or elevated IgE ? ?Symptoms improved with Symbicort we will continue for now ?If he is doing well at return visit then we can see if he can come off the inhaler. ? ?OSA ?Not on CPAP.  He feels that he lost weight and is doing better now ? ?Plan/Recommendations: ?CBC, IgE ?Symbicort ?PFTs ? ?Marshell Garfinkel MD ?[facility] ?08/30/2021, 12:10 PM ? ?CC: Emeterio Reeve, DO ? ?  ?

## 2021-12-26 ENCOUNTER — Encounter: Payer: 59 | Admitting: Family Medicine

## 2022-02-21 ENCOUNTER — Encounter: Payer: 59 | Admitting: Family Medicine

## 2022-09-24 ENCOUNTER — Ambulatory Visit
Admission: RE | Admit: 2022-09-24 | Discharge: 2022-09-24 | Disposition: A | Discharge status: Home / Self Care | Payer: 59 | Source: Ambulatory Visit | Attending: Family Medicine | Admitting: Family Medicine

## 2022-09-24 ENCOUNTER — Other Ambulatory Visit: Payer: Self-pay

## 2022-09-24 VITALS — BP 110/74 | HR 89 | Temp 98.9°F | Resp 18 | Ht 71.0 in | Wt 220.0 lb

## 2022-09-24 DIAGNOSIS — J40 Bronchitis, not specified as acute or chronic: Secondary | ICD-10-CM | POA: Diagnosis not present

## 2022-09-24 DIAGNOSIS — J309 Allergic rhinitis, unspecified: Secondary | ICD-10-CM | POA: Diagnosis not present

## 2022-09-24 DIAGNOSIS — R059 Cough, unspecified: Secondary | ICD-10-CM

## 2022-09-24 DIAGNOSIS — J329 Chronic sinusitis, unspecified: Secondary | ICD-10-CM | POA: Diagnosis not present

## 2022-09-24 MED ORDER — FEXOFENADINE HCL 180 MG PO TABS: 180.0000 mg | ORAL_TABLET | Freq: Every day | ORAL | 0 refills | Status: DC

## 2022-09-24 MED ORDER — PREDNISONE 10 MG (21) PO TBPK: ORAL_TABLET | Freq: Every day | ORAL | 0 refills | Status: DC

## 2022-09-24 MED ORDER — METHYLPREDNISOLONE SODIUM SUCC 125 MG IJ SOLR
125.0000 mg | Freq: Once | INTRAMUSCULAR | Status: AC
Start: 2022-09-24 — End: 2022-09-24
  Administered 2022-09-24: 125 mg via INTRAMUSCULAR

## 2022-09-24 MED ORDER — DOXYCYCLINE HYCLATE 100 MG PO CAPS: 100.0000 mg | ORAL_CAPSULE | Freq: Two times a day (BID) | ORAL | 0 refills | Status: AC

## 2022-09-24 MED ORDER — HYDROCODONE BIT-HOMATROP MBR 5-1.5 MG/5ML PO SOLN: 5.0000 mL | Freq: Four times a day (QID) | ORAL | 0 refills | Status: DC | PRN

## 2022-09-24 MED ORDER — BENZONATATE 200 MG PO CAPS: 200.0000 mg | ORAL_CAPSULE | Freq: Three times a day (TID) | ORAL | 0 refills | Status: AC | PRN

## 2022-09-24 NOTE — ED Triage Notes (Signed)
Patient c/o non-productive cough, congestion, nasal drainage, fever x 5 days.  Patient has taken Nyquil, Dayquil, Motrin and Robitussin.

## 2022-09-24 NOTE — ED Provider Notes (Signed)
[facility]    CSN: [id number] Arrival date & time: 09/24/22  1307      History   Chief Complaint Chief Complaint  Patient presents with   Cough    Chest congestion, cough, cold - Entered by patient    HPI Brandon Thomas is a 47 y.o. male.   HPI 47 year old male presents with cough, chest congestion nasal sinus drainage with fever for 5 to 7 days.  PMH significant for OSA, moderate hypoxic ischemic encephalopathy, and CVA.  Patient is accompanied by his wife this afternoon.  Past Medical History:  Diagnosis Date   Depression    Well treated without meds currently   Elevated LFTs 08/20/2013   Hyperlipidemia 08/20/2013    Patient Active Problem List   Diagnosis Date Noted   OSA (obstructive sleep apnea) 07/07/2021   Moderate hypoxic-ischemic encephalopathy    CVA (cerebral vascular accident) (HCC) 04/09/2019   Cellulitis 04/09/2019   Superficial venous thrombosis of arm, right 04/09/2019   Edema of right upper arm 04/09/2019   Substance abuse (HCC) 04/09/2019   Encephalopathy 04/09/2019   Anal fissure 04/15/2015   Hypogonadism in male 04/13/2015   Chest pain 04/13/2015   Snoring 04/13/2015   Fatigue 04/13/2015   Dyslipidemia 04/13/2015   Insomnia 07/17/2014   Anxiety 09/08/2013   Hyperlipidemia 08/20/2013   Elevated LFTs 08/20/2013   Exertional chest pain 08/20/2013    Past Surgical History:  Procedure Laterality Date   WISDOM TOOTH EXTRACTION         Home Medications    Prior to Admission medications   Medication Sig Start Date End Date Taking? Authorizing Provider  benzonatate (TESSALON) 200 MG capsule Take 1 capsule (200 mg total) by mouth 3 (three) times daily as needed for up to 7 days. 09/24/22 10/01/22 Yes Trevor Iha, FNP  doxycycline (VIBRAMYCIN) 100 MG capsule Take 1 capsule (100 mg total) by mouth 2 (two) times daily for 10 days. 09/24/22 10/04/22 Yes Trevor Iha, FNP  fexofenadine [facility] ALLERGY) 180 MG tablet Take 1  tablet (180 mg total) by mouth daily for 15 days. 09/24/22 10/09/22 Yes Trevor Iha, FNP  HYDROcodone bit-homatropine (HYCODAN) 5-1.5 MG/5ML syrup Take 5 mLs by mouth every 6 (six) hours as needed for cough. 09/24/22  Yes Trevor Iha, FNP  predniSONE (STERAPRED UNI-PAK 21 TAB) 10 MG (21) TBPK tablet Take by mouth daily. Take 6 tabs by mouth daily  for 2 days, then 5 tabs for 2 days, then 4 tabs for 2 days, then 3 tabs for 2 days, 2 tabs for 2 days, then 1 tab by mouth daily for 2 days 09/24/22  Yes Trevor Iha, FNP  albuterol (VENTOLIN HFA) 108 (90 Base) MCG/ACT inhaler Inhale into the lungs. 01/20/20   [provider]  budesonide-formoterol (SYMBICORT) 160-4.5 MCG/ACT inhaler Inhale 2 puffs into the lungs in the morning and at bedtime. 08/30/21   Chilton Greathouse, MD  Misc. Devices MISC  12/17/19   [provider]  sertraline (ZOLOFT) 100 MG tablet Take 1 tablet (100 mg total) by mouth daily. Patient not taking: Reported on 03/10/2020 05/07/19 03/30/20  Sunnie Nielsen, DO    Family History Family History  Adopted: Yes  Family history unknown: Yes    Social History Social History   Tobacco Use   Smoking status: Never    Passive exposure: Past   Smokeless tobacco: Current    Types: Chew  Vaping Use   Vaping Use: Former  Substance Use Topics   Alcohol use: Not  Currently   Drug use: Not Currently     Allergies   Atorvastatin, Crestor [rosuvastatin], and Penicillins   Review of Systems Review of Systems  Constitutional:  Positive for fever.  HENT:  Positive for congestion, sinus pressure and sinus pain.   Respiratory:  Positive for cough.   All other systems reviewed and are negative.    Physical Exam Triage Vital Signs ED Triage Vitals  Enc Vitals Group     BP 09/24/22 1400 110/74     Pulse Rate 09/24/22 1400 89     Resp 09/24/22 1400 18     Temp 09/24/22 1400 98.9 F (37.2 C)     Temp Source 09/24/22 1400 Oral     SpO2 09/24/22 1400 93 %      Weight 09/24/22 1402 220 lb (99.8 kg)     Height 09/24/22 1402 5\' 11"  (1.803 m)     Head Circumference --      Peak Flow --      Pain Score 09/24/22 1402 0     Pain Loc --      Pain Edu? --      Excl. in GC? --    No data found.  Updated Vital Signs BP 110/74 (BP Location: Left Arm)   Pulse 89   Temp 98.9 F (37.2 C) (Oral)   Resp 18   Ht 5\' 11"  (1.803 m)   Wt 220 lb (99.8 kg)   SpO2 93%   BMI 30.68 kg/m      Physical Exam Vitals and nursing note reviewed.  Constitutional:      Appearance: Normal appearance. He is normal weight.  HENT:     Head: Normocephalic and atraumatic.     Right Ear: Tympanic membrane and external ear normal.     Left Ear: Tympanic membrane and external ear normal.     Ears:     Comments: Significant eustachian tube dysfunction noted bilaterally    Mouth/Throat:     Mouth: Mucous membranes are moist.     Pharynx: Oropharynx is clear.     Comments: Significant amount of clear drainage of posterior oropharynx noted Eyes:     Extraocular Movements: Extraocular movements intact.     Conjunctiva/sclera: Conjunctivae normal.     Pupils: Pupils are equal, round, and reactive to light.  Cardiovascular:     Rate and Rhythm: Normal rate and regular rhythm.     Pulses: Normal pulses.     Heart sounds: Normal heart sounds.  Pulmonary:     Effort: Pulmonary effort is normal.     Breath sounds: Rhonchi present. No wheezing or rales.     Comments: Diffuse mild rhonchi noted throughout with frequent nonproductive cough Musculoskeletal:        General: Normal range of motion.     Cervical back: Normal range of motion and neck supple.  Skin:    General: Skin is warm and dry.  Neurological:     General: No focal deficit present.     Mental Status: He is alert and oriented to person, place, and time. Mental status is at baseline.      UC Treatments / Results  Labs (all labs ordered are listed, but only abnormal results are displayed) Labs Reviewed  - No data to display  EKG   Radiology No results found.  Procedures Procedures (including critical care time)  Medications Ordered in UC Medications  methylPREDNISolone sodium succinate (SOLU-MEDROL) 125 mg/2 mL injection 125 mg (125 mg Intramuscular Given 09/24/22  1432)    Initial Impression / Assessment and Plan / UC Course  I have reviewed the triage vital signs and the nursing notes.  Pertinent labs & imaging results that were available during my care of the patient were reviewed by me and considered in my medical decision making (see chart for details).     MDM: 1.  Sinobronchitis-Rx'd Doxycycline 100 mg twice daily x 10 days, Sterapred Unipak (tapering from 60 mg to 10 mg over 12 days); 2.  Cough-Rx'd Tessalon 200 mg Perles 3 times daily, as needed, Hycodan 1.5-5/5 mL: Take 5 mL every 8 hours for cough, as needed-patient advised of sedative effects of this medication. 3.  Allergic rhinitis, unspecified seasonality, unspecified trigger-Rx'd Allegra 180 mg daily x 5 days, then as needed. Advised patient to take medications as directed with food to completion.  Advised patient to take Allegra and Prednisone with first dose of Doxycycline for the next 10 days.  Advised may discontinue Allegra after 5 days and use as needed for concurrent postnasal drainage/drip.  Advised may use Tessalon daily or as needed for cough.  Advised may use Hycodan at night prior to sleep for cough.  Patient cautioned of sedate of effects of this medication.  Advised patient not to use cough medications together.  Encouraged increase daily water intake to 64 ounces per day while taking these medications.  Advised if symptoms worsen and/or unresolved please follow-up PCP or here for further evaluation. Final Clinical Impressions(s) / UC Diagnoses   Final diagnoses:  Cough, unspecified type  Sinobronchitis  Allergic rhinitis, unspecified seasonality, unspecified trigger     Discharge Instructions       Advised patient to take medications as directed with food to completion.  Advised patient to take Allegra and Prednisone with first dose of Doxycycline for the next 10 days.  Advised may discontinue Allegra after 5 days and use as needed for concurrent postnasal drainage/drip.  Advised may use Tessalon daily or as needed for cough.  Advised may use Hycodan at night prior to sleep for cough.  Patient cautioned of sedate of effects of this medication.  Advised patient not to use cough medications together.  Encouraged increase daily water intake to 64 ounces per day while taking these medications.  Advised if symptoms worsen and/or unresolved please follow-up PCP or here for further evaluation.     ED Prescriptions     Medication Sig Dispense Auth. Provider   doxycycline (VIBRAMYCIN) 100 MG capsule Take 1 capsule (100 mg total) by mouth 2 (two) times daily for 10 days. 20 capsule Trevor Iha, FNP   predniSONE (STERAPRED UNI-PAK 21 TAB) 10 MG (21) TBPK tablet Take by mouth daily. Take 6 tabs by mouth daily  for 2 days, then 5 tabs for 2 days, then 4 tabs for 2 days, then 3 tabs for 2 days, 2 tabs for 2 days, then 1 tab by mouth daily for 2 days 42 tablet Trevor Iha, FNP   fexofenadine [facility] ALLERGY) 180 MG tablet Take 1 tablet (180 mg total) by mouth daily for 15 days. 15 tablet Trevor Iha, FNP   benzonatate (TESSALON) 200 MG capsule Take 1 capsule (200 mg total) by mouth 3 (three) times daily as needed for up to 7 days. 40 capsule Trevor Iha, FNP   HYDROcodone bit-homatropine (HYCODAN) 5-1.5 MG/5ML syrup Take 5 mLs by mouth every 6 (six) hours as needed for cough. 120 mL Trevor Iha, FNP      I have reviewed the PDMP during  this encounter.   Trevor Iha, FNP 09/24/22 1504

## 2022-09-24 NOTE — Discharge Instructions (Addendum)
Advised patient to take medications as directed with food to completion.  Advised patient to take Allegra and Prednisone with first dose of Doxycycline for the next 10 days.  Advised may discontinue Allegra after 5 days and use as needed for concurrent postnasal drainage/drip.  Advised may use Tessalon daily or as needed for cough.  Advised may use Hycodan at night prior to sleep for cough.  Patient cautioned of sedate of effects of this medication.  Advised patient not to use cough medications together.  Encouraged increase daily water intake to 64 ounces per day while taking these medications.  Advised if symptoms worsen and/or unresolved please follow-up PCP or here for further evaluation.

## 2022-10-12 ENCOUNTER — Ambulatory Visit (INDEPENDENT_AMBULATORY_CARE_PROVIDER_SITE_OTHER): Payer: 59

## 2022-10-12 ENCOUNTER — Ambulatory Visit
Admission: RE | Admit: 2022-10-12 | Discharge: 2022-10-12 | Disposition: A | Discharge status: Home / Self Care | Payer: 59 | Source: Ambulatory Visit | Attending: Family Medicine | Admitting: Family Medicine

## 2022-10-12 VITALS — BP 125/81 | HR 99 | Temp 98.7°F | Resp 16

## 2022-10-12 DIAGNOSIS — R059 Cough, unspecified: Secondary | ICD-10-CM | POA: Diagnosis not present

## 2022-10-12 DIAGNOSIS — J309 Allergic rhinitis, unspecified: Secondary | ICD-10-CM | POA: Diagnosis not present

## 2022-10-12 NOTE — ED Provider Notes (Signed)
[facility]    CSN: [id number] Arrival date & time: 10/12/22  1755      History   Chief Complaint Chief Complaint  Patient presents with   Cough    Entered by patient    HPI Brandon Thomas is a 47 y.o. male.   HPI Pleasant 47 year old male presents with continued cough since 5/19.  Patient evaluated and treated by me on 09/24/2022 for cough-please see epic for that encounter note.  PMH significant for CVA, substance abuse, and OSA.  Past Medical History:  Diagnosis Date   Depression    Well treated without meds currently   Elevated LFTs 08/20/2013   Hyperlipidemia 08/20/2013    Patient Active Problem List   Diagnosis Date Noted   OSA (obstructive sleep apnea) 07/07/2021   Moderate hypoxic-ischemic encephalopathy    CVA (cerebral vascular accident) (HCC) 04/09/2019   Cellulitis 04/09/2019   Superficial venous thrombosis of arm, right 04/09/2019   Edema of right upper arm 04/09/2019   Substance abuse (HCC) 04/09/2019   Encephalopathy 04/09/2019   Anal fissure 04/15/2015   Hypogonadism in male 04/13/2015   Chest pain 04/13/2015   Snoring 04/13/2015   Fatigue 04/13/2015   Dyslipidemia 04/13/2015   Insomnia 07/17/2014   Anxiety 09/08/2013   Hyperlipidemia 08/20/2013   Elevated LFTs 08/20/2013   Exertional chest pain 08/20/2013    Past Surgical History:  Procedure Laterality Date   WISDOM TOOTH EXTRACTION         Home Medications    Prior to Admission medications   Medication Sig Start Date End Date Taking? Authorizing Provider  albuterol (VENTOLIN HFA) 108 (90 Base) MCG/ACT inhaler Inhale into the lungs. 01/20/20   [provider]  budesonide-formoterol (SYMBICORT) 160-4.5 MCG/ACT inhaler Inhale 2 puffs into the lungs in the morning and at bedtime. 08/30/21   Mannam, Colbert Coyer, MD  fexofenadine (ALLEGRA ALLERGY) 180 MG tablet Take 1 tablet (180 mg total) by mouth daily for 15 days. 09/24/22 10/09/22  Trevor Iha, FNP  HYDROcodone  bit-homatropine (HYCODAN) 5-1.5 MG/5ML syrup Take 5 mLs by mouth every 6 (six) hours as needed for cough. 09/24/22   Trevor Iha, FNP  Misc. Devices MISC  12/17/19   [provider]  predniSONE (STERAPRED UNI-PAK 21 TAB) 10 MG (21) TBPK tablet Take by mouth daily. Take 6 tabs by mouth daily  for 2 days, then 5 tabs for 2 days, then 4 tabs for 2 days, then 3 tabs for 2 days, 2 tabs for 2 days, then 1 tab by mouth daily for 2 days 09/24/22   Trevor Iha, FNP  sertraline (ZOLOFT) 100 MG tablet Take 1 tablet (100 mg total) by mouth daily. Patient not taking: Reported on 03/10/2020 05/07/19 03/30/20  Sunnie Nielsen, DO    Family History Family History  Adopted: Yes  Family history unknown: Yes    Social History Social History   Tobacco Use   Smoking status: Never    Passive exposure: Past   Smokeless tobacco: Current    Types: Chew  Vaping Use   Vaping Use: Former  Substance Use Topics   Alcohol use: Not Currently   Drug use: Not Currently     Allergies   Atorvastatin, Crestor [rosuvastatin], and Penicillins   Review of Systems Review of Systems  HENT:  Positive for postnasal drip.   Respiratory:  Positive for cough.   All other systems reviewed and are negative.    Physical Exam Triage Vital Signs ED Triage Vitals  Enc Vitals Group  BP 10/12/22 1824 125/81     Pulse Rate 10/12/22 1824 99     Resp 10/12/22 1824 16     Temp 10/12/22 1824 98.7 F (37.1 C)     Temp Source 10/12/22 1824 Oral     SpO2 10/12/22 1824 95 %     Weight --      Height --      Head Circumference --      Peak Flow --      Pain Score 10/12/22 1823 0     Pain Loc --      Pain Edu? --      Excl. in GC? --    No data found.  Updated Vital Signs BP 125/81 (BP Location: Left Arm)   Pulse 99   Temp 98.7 F (37.1 C) (Oral)   Resp 16   SpO2 95%       Physical Exam Vitals and nursing note reviewed.  Constitutional:      Appearance: Normal appearance. He is normal  weight.  HENT:     Head: Normocephalic and atraumatic.     Right Ear: Tympanic membrane, ear canal and external ear normal.     Left Ear: Tympanic membrane, ear canal and external ear normal.     Mouth/Throat:     Mouth: Mucous membranes are moist.     Pharynx: Oropharynx is clear.     Comments: Significant amount of clear drainage of posterior oropharynx noted Eyes:     Extraocular Movements: Extraocular movements intact.     Conjunctiva/sclera: Conjunctivae normal.     Pupils: Pupils are equal, round, and reactive to light.  Cardiovascular:     Rate and Rhythm: Normal rate and regular rhythm.     Pulses: Normal pulses.     Heart sounds: Normal heart sounds.  Pulmonary:     Effort: Pulmonary effort is normal.     Breath sounds: Normal breath sounds. No wheezing, rhonchi or rales.     Comments: Frequent nonproductive cough on exam Musculoskeletal:        General: Normal range of motion.     Cervical back: Normal range of motion and neck supple.  Skin:    General: Skin is warm and dry.  Neurological:     General: No focal deficit present.     Mental Status: He is alert and oriented to person, place, and time. Mental status is at baseline.  Psychiatric:        Mood and Affect: Mood normal.        Behavior: Behavior normal.      UC Treatments / Results  Labs (all labs ordered are listed, but only abnormal results are displayed) Labs Reviewed - No data to display  EKG   Radiology DG Chest 2 View  Result Date: 10/12/2022 CLINICAL DATA:  Cough for 2 weeks EXAM: CHEST - 2 VIEW COMPARISON:  05/31/2019 FINDINGS: The heart size and mediastinal contours are within normal limits. Both lungs are clear. The visualized skeletal structures are unremarkable. IMPRESSION: No active cardiopulmonary disease. Electronically Signed   By: Alcide Clever M.D.   On: 10/12/2022 19:32    Procedures Procedures (including critical care time)  Medications Ordered in UC Medications - No data to  display  Initial Impression / Assessment and Plan / UC Course  I have reviewed the triage vital signs and the nursing notes.  Pertinent labs & imaging results that were available during my care of the patient were reviewed by me and considered  in my medical decision making (see chart for details).     MDM: 1.  Allergic rhinitis-advised patient to continue taking previously prescribed Allegra. Advised patient to take Allegra daily for concurrent postnasal drainage/drip.  Advised if cough worsens please follow-up with PCP for further evaluation. Final Clinical Impressions(s) / UC Diagnoses   Final diagnoses:  Allergic rhinitis, unspecified seasonality, unspecified trigger     Discharge Instructions      Advised patient to take Allegra daily for concurrent postnasal drainage/drip.  Advised if cough worsens please follow-up with PCP for further evaluation.     ED Prescriptions   None    PDMP not reviewed this encounter.   Trevor Iha, FNP 10/12/22 1954

## 2022-10-12 NOTE — Discharge Instructions (Addendum)
Advised patient to take Allegra daily for concurrent postnasal drainage/drip.  Advised if cough worsens please follow-up with PCP for further evaluation.

## 2022-10-12 NOTE — ED Triage Notes (Signed)
Patient presents to UC for continued cough since 05/19. States he took the antibiotic and prednisone with no improvement. States  "I get an itch on right side" triggering the cough.   Denies fever.

## 2022-12-25 ENCOUNTER — Encounter: Payer: Self-pay | Admitting: Family Medicine

## 2022-12-25 ENCOUNTER — Ambulatory Visit: Payer: 59 | Admitting: Family Medicine

## 2022-12-25 VITALS — BP 117/75 | HR 86 | Resp 18 | Ht 71.0 in | Wt 220.2 lb

## 2022-12-25 DIAGNOSIS — Z0000 Encounter for general adult medical examination without abnormal findings: Secondary | ICD-10-CM

## 2022-12-25 DIAGNOSIS — E785 Hyperlipidemia, unspecified: Secondary | ICD-10-CM | POA: Diagnosis not present

## 2022-12-25 NOTE — Assessment & Plan Note (Signed)
-   pt has allergy to crestor and lipitor secondary to elevated LFTs - will hold off on cholesterol medicine at this time until we can see what the lipid panel looks like

## 2022-12-25 NOTE — Progress Notes (Signed)
Established patient visit   Patient: Brandon Thomas   DOB: November 07, 1975   47 y.o. Male  MRN: [id number] Visit Date: 12/25/2022  Today's healthcare provider: Charlton Amor, DO   Chief Complaint  Patient presents with   Transitions Of Care    SUBJECTIVE    Chief Complaint  Patient presents with   Transitions Of Care   HPI   Pt presents for wellness visit.   Pmh Stroke secondary to prescription drug  HLD was on cholesterol medicine but has issues with elevated LFTs   Screenings: Colon Cancer screenings (start at age 72): has had cologuard PSA: needs one  Family hx: unknown secondary to patient being adopted  Social hx: Alcohol use: rare Tobacco (chew, smoke): vapes  Who do you live with: girlfriend and two children and then part time girlfriend's kids Safe at home: yes   Review of Systems  Constitutional:  Negative for activity change, fatigue and fever.  Respiratory:  Negative for cough and shortness of breath.   Cardiovascular:  Negative for chest pain.  Gastrointestinal:  Negative for abdominal pain.  Genitourinary:  Negative for difficulty urinating.       Current Meds  Medication Sig   [DISCONTINUED] sildenafil (VIAGRA) 100 MG tablet Take by mouth.    OBJECTIVE    BP 117/75 (BP Location: Right Arm, Patient Position: Sitting, Cuff Size: Large)   Pulse 86   Resp 18   Ht 5\' 11"  (1.803 m)   Wt 220 lb 4 oz (99.9 kg)   SpO2 97%   BMI 30.72 kg/m   Physical Exam Vitals and nursing note reviewed.  Constitutional:      General: He is not in acute distress.    Appearance: Normal appearance.  HENT:     Head: Normocephalic and atraumatic.     Right Ear: External ear normal.     Left Ear: External ear normal.     Nose: Nose normal.  Eyes:     Conjunctiva/sclera: Conjunctivae normal.  Cardiovascular:     Rate and Rhythm: Normal rate and regular rhythm.  Pulmonary:     Effort: Pulmonary effort is normal.     Breath sounds: Normal breath  sounds.  Abdominal:     General: Abdomen is flat. Bowel sounds are normal.     Palpations: Abdomen is soft.     Tenderness: There is no abdominal tenderness.  Neurological:     General: No focal deficit present.     Mental Status: He is alert and oriented to person, place, and time.  Psychiatric:        Mood and Affect: Mood normal.        Behavior: Behavior normal.        Thought Content: Thought content normal.        Judgment: Judgment normal.         ASSESSMENT & PLAN    Problem List Items Addressed This Visit       Other   Hyperlipidemia (Chronic)    - pt has allergy to crestor and lipitor secondary to elevated LFTs - will hold off on cholesterol medicine at this time until we can see what the lipid panel looks like       Routine adult health maintenance - Primary   Relevant Orders   CBC   COMPLETE METABOLIC PANEL WITH GFR   Lipid panel   PSA    No follow-ups on file.      No orders of the  defined types were placed in this encounter.   Orders Placed This Encounter  Procedures   CBC   COMPLETE METABOLIC PANEL WITH GFR   Lipid panel    Order Specific Question:   Has the patient fasted?    Answer:   No    Order Specific Question:   Release to patient    Answer:   Immediate   PSA     Charlton Amor, DO  [facility] at [facility] [phone] (phone) [phone] (fax)  [facility]

## 2022-12-26 ENCOUNTER — Other Ambulatory Visit: Payer: Self-pay | Admitting: Family Medicine

## 2022-12-26 LAB — CBC
Hematocrit: 43.6 % (ref 37.5–51.0)
Hemoglobin: 14.9 g/dL (ref 13.0–17.7)
MCH: 29.3 pg (ref 26.6–33.0)
MCHC: 34.2 g/dL (ref 31.5–35.7)
MCV: 86 fL (ref 79–97)
Platelets: 324 10*3/uL (ref 150–450)
RBC: 5.08 x10E6/uL (ref 4.14–5.80)
RDW: 12 % (ref 11.6–15.4)
WBC: 6.5 10*3/uL (ref 3.4–10.8)

## 2022-12-26 LAB — COMPREHENSIVE METABOLIC PANEL
ALT: 31 IU/L (ref 0–44)
AST: 22 IU/L (ref 0–40)
Albumin: 4.3 g/dL (ref 4.1–5.1)
Alkaline Phosphatase: 69 IU/L (ref 44–121)
BUN/Creatinine Ratio: 11 (ref 9–20)
BUN: 10 mg/dL (ref 6–24)
Bilirubin Total: 0.4 mg/dL (ref 0.0–1.2)
CO2: 23 mmol/L (ref 20–29)
Calcium: 9.6 mg/dL (ref 8.7–10.2)
Chloride: 103 mmol/L (ref 96–106)
Creatinine, Ser: 0.95 mg/dL (ref 0.76–1.27)
Globulin, Total: 2.2 g/dL (ref 1.5–4.5)
Glucose: 85 mg/dL (ref 70–99)
Potassium: 4.2 mmol/L (ref 3.5–5.2)
Sodium: 142 mmol/L (ref 134–144)
Total Protein: 6.5 g/dL (ref 6.0–8.5)
eGFR: 99 mL/min/{1.73_m2} (ref >59)

## 2022-12-26 LAB — LIPID PANEL
Chol/HDL Ratio: 6.2 ratio — ABNORMAL HIGH (ref 0.0–5.0)
Cholesterol, Total: 230 mg/dL — ABNORMAL HIGH (ref 100–199)
HDL: 37 mg/dL — ABNORMAL LOW (ref >39)
LDL Chol Calc (NIH): 174 mg/dL — ABNORMAL HIGH (ref 0–99)
Triglycerides: 105 mg/dL (ref 0–149)
VLDL Cholesterol Cal: 19 mg/dL (ref 5–40)

## 2022-12-26 LAB — PSA: Prostate Specific Ag, Serum: 1 ng/mL (ref 0.0–4.0)

## 2022-12-26 MED ORDER — EZETIMIBE 10 MG PO TABS: 10.0000 mg | ORAL_TABLET | Freq: Every day | ORAL | 1 refills | Status: AC

## 2023-07-01 ENCOUNTER — Other Ambulatory Visit: Payer: Self-pay

## 2023-07-01 ENCOUNTER — Telehealth: Payer: Self-pay

## 2023-07-01 ENCOUNTER — Ambulatory Visit
Admission: EM | Admit: 2023-07-01 | Discharge: 2023-07-01 | Disposition: A | Discharge status: Home / Self Care | Payer: BC Managed Care – PPO | Attending: Family Medicine | Admitting: Family Medicine

## 2023-07-01 ENCOUNTER — Telehealth: Payer: Self-pay | Admitting: Family Medicine

## 2023-07-01 DIAGNOSIS — R059 Cough, unspecified: Secondary | ICD-10-CM

## 2023-07-01 DIAGNOSIS — J40 Bronchitis, not specified as acute or chronic: Secondary | ICD-10-CM

## 2023-07-01 MED ORDER — AZITHROMYCIN 250 MG PO TABS: 250.0000 mg | ORAL_TABLET | Freq: Every day | ORAL | 0 refills | Status: DC

## 2023-07-01 MED ORDER — HYDROCODONE BIT-HOMATROP MBR 5-1.5 MG/5ML PO SOLN: 5.0000 mL | Freq: Four times a day (QID) | ORAL | 0 refills | Status: DC | PRN

## 2023-07-01 MED ORDER — METHYLPREDNISOLONE SODIUM SUCC 125 MG IJ SOLR
125.0000 mg | Freq: Once | INTRAMUSCULAR | Status: AC
  Administered 2023-07-01: 125 mg via INTRAMUSCULAR

## 2023-07-01 NOTE — ED Triage Notes (Signed)
 Pt presents to uc with co of cough for 2 weeks. Pt reports his so and daughters all had similar symptoms and have all gotten better.

## 2023-07-01 NOTE — ED Provider Notes (Signed)
 [facility]    CSN: [id number] Arrival date & time: 07/01/23  4098      History   Chief Complaint Chief Complaint  Patient presents with   Cough    HPI AXAVIER Thomas is a 48 y.o. male.   HPI 48 year old male presents with cough and congestion for two weeks.  PMH significant for moderate hypoxic ischemic encephalopathy, CVA, and OSA.  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   Moderate hypoxic-ischemic encephalopathy    CVA (cerebral vascular accident) (HCC) 04/09/2019   Cellulitis 04/09/2019   Superficial venous thrombosis of arm, right 04/09/2019   Edema of right upper arm 04/09/2019   Substance abuse (HCC) 04/09/2019   Encephalopathy 04/09/2019   History of basal cell cancer 01/20/2019   History of opioid abuse (HCC) 01/20/2019   Anal fissure 04/15/2015   Hypogonadism in male 04/13/2015   Chest pain 04/13/2015   Snoring 04/13/2015   Fatigue 04/13/2015   Dyslipidemia 04/13/2015   Insomnia 07/17/2014   Anxiety 09/08/2013   Hyperlipidemia 08/20/2013   Elevated LFTs 08/20/2013   Exertional chest pain 08/20/2013    Past Surgical History:  Procedure Laterality Date   WISDOM TOOTH EXTRACTION         Home Medications    Prior to Admission medications   Medication Sig Start Date End Date Taking? Authorizing Provider  azithromycin (ZITHROMAX) 250 MG tablet Take 1 tablet (250 mg total) by mouth daily. Take first 2 tablets together, then 1 every day until finished. 07/01/23  Yes Trevor Iha, FNP  HYDROcodone bit-homatropine (HYCODAN) 5-1.5 MG/5ML syrup Take 5 mLs by mouth every 6 (six) hours as needed for cough.  07/01/23  Yes Trevor Iha, FNP  ezetimibe (ZETIA) 10 MG tablet Take 1 tablet (10 mg total) by mouth daily. 12/26/22   Charlton Amor, DO  Misc. Devices MISC  12/17/19   [provider]  sertraline (ZOLOFT) 100 MG tablet Take 1 tablet (100 mg total) by mouth daily. Patient not taking: Reported on 03/10/2020 05/07/19 03/30/20  Sunnie Nielsen, DO    Family History Family History  Adopted: Yes  Family history unknown: Yes    Social History Social History   Tobacco Use   Smoking status: Never    Passive exposure: Past   Smokeless tobacco: Current    Types: Chew  Vaping Use   Vaping status: Former  Substance Use Topics   Alcohol use: Not Currently   Drug use: Not Currently     Allergies   Atorvastatin, Crestor [rosuvastatin], and Penicillins   Review of Systems Review of Systems  HENT:  Positive for congestion.   Respiratory:  Positive for cough.   All other systems reviewed and are negative.    Physical Exam Triage Vital Signs ED Triage Vitals  Encounter Vitals Group     BP      Systolic BP Percentile      Diastolic BP Percentile      Pulse      Resp      Temp      Temp src      SpO2      Weight  Height      Head Circumference      Peak Flow      Pain Score      Pain Loc      Pain Education      Exclude from Growth Chart    No data found.  Updated Vital Signs BP 113/79   Pulse 91   Temp 98.4 F (36.9 C)   Resp 16   SpO2 98%      Physical Exam Vitals and nursing note reviewed.  Constitutional:      Appearance: Normal appearance. He is normal weight. He is ill-appearing.  HENT:     Head: Normocephalic and atraumatic.     Right Ear: Tympanic membrane, ear canal and external ear normal.     Left Ear: Tympanic membrane, ear canal and external ear normal.     Mouth/Throat:     Mouth: Mucous membranes are moist.     Pharynx: Oropharynx is clear.  Eyes:     Extraocular Movements: Extraocular movements intact.      Conjunctiva/sclera: Conjunctivae normal.     Pupils: Pupils are equal, round, and reactive to light.  Cardiovascular:     Rate and Rhythm: Normal rate and regular rhythm.     Pulses: Normal pulses.     Heart sounds: Normal heart sounds.  Pulmonary:     Effort: Pulmonary effort is normal.     Breath sounds: Rhonchi present. No wheezing or rales.     Comments: Diffuse scattered rhonchi noted throughout, frequent nonproductive cough on exam Musculoskeletal:        General: Normal range of motion.     Cervical back: Normal range of motion and neck supple.  Skin:    General: Skin is warm and dry.  Neurological:     General: No focal deficit present.     Mental Status: He is alert and oriented to person, place, and time. Mental status is at baseline.  Psychiatric:        Mood and Affect: Mood normal.        Behavior: Behavior normal.      UC Treatments / Results  Labs (all labs ordered are listed, but only abnormal results are displayed) Labs Reviewed - No data to display  EKG   Radiology No results found.  Procedures Procedures (including critical care time)  Medications Ordered in UC Medications  methylPREDNISolone sodium succinate (SOLU-MEDROL) 125 mg/2 mL injection 125 mg (has no administration in time range)    Initial Impression / Assessment and Plan / UC Course  I have reviewed the triage vital signs and the nursing notes.  Pertinent labs & imaging results that were available during my care of the patient were reviewed by me and considered in my medical decision making (see chart for details).     MDM: 1.  Cough, unspecified type-Rx'd Hycodan 5-1.5 mg / 5 mL syrup: Take 5 mL every 6 hours for cough. 2.  Bronchitis-IM Solu-Medrol given once in clinic and prior to discharge per patient's request. Advised patient to take medications as directed with food to completion.  Advised may take Hycodan cough syrup at night prior to sleep due to sedative effects.  Encouraged  to increase daily water intake to 64 ounces per day while taking these medications.  Advised if symptoms worsen and/or unresolved please follow-up with your PCP or here for further evaluation.  Patient discharged home, hemodynamically stable. Final Clinical Impressions(s) / UC Diagnoses   Final diagnoses:  Cough, unspecified type  Bronchitis     Discharge Instructions      Advised patient to take medications as directed with food to completion.  Advised may take Hycodan cough syrup at night prior to sleep due to sedative effects.  Encouraged to increase daily water intake to 64 ounces per day while taking these medications.  Advised if symptoms worsen and/or unresolved please follow-up with your PCP or here for further evaluation.     ED Prescriptions     Medication Sig Dispense Auth. Provider   azithromycin (ZITHROMAX) 250 MG tablet Take 1 tablet (250 mg total) by mouth daily. Take first 2 tablets together, then 1 every day until finished. 6 tablet Trevor Iha, FNP   HYDROcodone bit-homatropine (HYCODAN) 5-1.5 MG/5ML syrup Take 5 mLs by mouth every 6 (six) hours as needed for cough. 120 mL Trevor Iha, FNP      I have reviewed the PDMP during this encounter.   Trevor Iha, FNP 07/01/23 [phone]

## 2023-07-01 NOTE — Discharge Instructions (Addendum)
 Advised patient to take medications as directed with food to completion.  Advised may take Hycodan cough syrup at night prior to sleep due to sedative effects.  Encouraged to increase daily water intake to 64 ounces per day while taking these medications.  Advised if symptoms worsen and/or unresolved please follow-up with your PCP or here for further evaluation.

## 2023-07-01 NOTE — Telephone Encounter (Signed)
 Patient wanted [place] to change his pharmacy after he departed clinic. Medicine sent to [facility] [place], [facility] per patient request.

## 2023-07-05 ENCOUNTER — Ambulatory Visit
Admission: RE | Admit: 2023-07-05 | Discharge: 2023-07-05 | Disposition: A | Discharge status: Home / Self Care | Payer: BC Managed Care – PPO | Source: Ambulatory Visit | Attending: Family Medicine | Admitting: Family Medicine

## 2023-07-05 ENCOUNTER — Ambulatory Visit: Payer: BC Managed Care – PPO

## 2023-07-05 ENCOUNTER — Other Ambulatory Visit: Payer: Self-pay

## 2023-07-05 VITALS — BP 125/76 | HR 93 | Temp 98.2°F | Resp 16

## 2023-07-05 DIAGNOSIS — J069 Acute upper respiratory infection, unspecified: Secondary | ICD-10-CM | POA: Diagnosis not present

## 2023-07-05 DIAGNOSIS — J111 Influenza due to unidentified influenza virus with other respiratory manifestations: Secondary | ICD-10-CM

## 2023-07-05 DIAGNOSIS — R0689 Other abnormalities of breathing: Secondary | ICD-10-CM | POA: Diagnosis not present

## 2023-07-05 DIAGNOSIS — R069 Unspecified abnormalities of breathing: Secondary | ICD-10-CM

## 2023-07-05 DIAGNOSIS — R059 Cough, unspecified: Secondary | ICD-10-CM

## 2023-07-05 MED ORDER — DOXYCYCLINE HYCLATE 100 MG PO CAPS: 100.0000 mg | ORAL_CAPSULE | Freq: Two times a day (BID) | ORAL | 0 refills | Status: AC

## 2023-07-05 MED ORDER — PREDNISONE 10 MG (21) PO TBPK: ORAL_TABLET | Freq: Every day | ORAL | 0 refills | Status: DC

## 2023-07-05 NOTE — Discharge Instructions (Addendum)
 Advised patient to complete Zithromax.  Then start Doxycycline and Prednisone.  Advised patient to take prednisone with first dose of doxycycline until complete.  Encouraged to increase daily water intake to 64 ounces per day while taking these medications.  Advised we will follow-up with chest x-ray results once received.  Advised if symptoms worsen and/or unresolved please follow-up with your PCP or here for further evaluation.

## 2023-07-05 NOTE — ED Triage Notes (Addendum)
 Has been sick for 2 weeks, seen on Sunday and dx with bronchitis. Woke up Monday with fever and went to ER and was dx with flu. Doctor at ER told him to stop taking abx and take tamiflu but patient decided to take abx again today due to the cough severity. Has been taking tylenol, ibuprofen, mucinex. Has wheezing and crackling in lungs per significant other. Had cxr Monday.

## 2023-07-05 NOTE — ED Provider Notes (Signed)
 [facility]    CSN: [id number] Arrival date & time: 07/05/23  1759      History   Chief Complaint Chief Complaint  Patient presents with   Cough    Entered by patient    HPI Brandon Thomas is a 48 y.o. male.   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   Moderate hypoxic-ischemic encephalopathy    CVA (cerebral vascular accident) (HCC) 04/09/2019   Cellulitis 04/09/2019   Superficial venous thrombosis of arm, right 04/09/2019   Edema of right upper arm 04/09/2019   Substance abuse (HCC) 04/09/2019   Encephalopathy 04/09/2019   History of basal cell cancer 01/20/2019   History of opioid abuse (HCC) 01/20/2019   Anal fissure 04/15/2015   Hypogonadism in male 04/13/2015   Chest pain 04/13/2015   Snoring 04/13/2015   Fatigue 04/13/2015   Dyslipidemia 04/13/2015   Insomnia 07/17/2014   Anxiety 09/08/2013   Hyperlipidemia 08/20/2013   Elevated LFTs 08/20/2013   Exertional chest pain 08/20/2013    Past Surgical History:  Procedure Laterality Date   WISDOM TOOTH EXTRACTION         Home Medications    Prior to Admission medications   Medication Sig Start Date End Date Taking? Authorizing Provider  doxycycline (VIBRAMYCIN) 100 MG capsule Take 1 capsule (100 mg total) by mouth 2 (two) times daily for 7 days. 07/05/23 07/12/23 Yes Trevor Iha, FNP  predniSONE (STERAPRED UNI-PAK 21 TAB) 10 MG (21) TBPK tablet Take by mouth daily. Take 6 tabs by mouth daily  for 2 days, then 5 tabs for 2 days, then 4 tabs for 2 days, then 3 tabs for 2 days, 2 tabs for 2 days, then 1 tab by mouth daily for 2 days 07/05/23  Yes Trevor Iha, FNP  azithromycin (ZITHROMAX) 250 MG tablet Take 1 tablet (250 mg total) by mouth daily. Take first 2 tablets together, then 1 every day until finished. 07/01/23   Trevor Iha, FNP  azithromycin (ZITHROMAX) 250 MG tablet Take 1 tablet (250 mg total) by mouth daily. Take first 2 tablets together, then 1 every day until finished. 07/01/23   Trevor Iha, FNP  ezetimibe (ZETIA) 10 MG tablet Take 1 tablet (10 mg total) by mouth daily. 12/26/22   Charlton Amor, DO  HYDROcodone bit-homatropine (HYCODAN) 5-1.5 MG/5ML syrup Take 5 mLs by mouth every 6 (six) hours as needed for cough. 07/01/23   Trevor Iha, FNP  HYDROcodone bit-homatropine (HYCODAN) 5-1.5 MG/5ML syrup Take 5 mLs by mouth every 6 (six) hours as needed for cough. 07/01/23   Trevor Iha, FNP  Misc. Devices MISC  12/17/19   [provider]  sertraline (ZOLOFT) 100 MG tablet Take 1 tablet (100 mg total) by mouth daily. Patient not taking: Reported on 03/10/2020 05/07/19 03/30/20  Sunnie Nielsen, DO    Family History Family History  Adopted: Yes  Family history unknown: Yes    Social History Social History   Tobacco Use   Smoking status: Never    Passive exposure: Past   Smokeless tobacco: Current    Types: Chew  Vaping Use   Vaping status: Former  Substance  Use Topics   Alcohol use: Not Currently   Drug use: Not Currently     Allergies   Atorvastatin, Crestor [rosuvastatin], and Penicillins   Review of Systems Review of Systems   Physical Exam Triage Vital Signs ED Triage Vitals  Encounter Vitals Group     BP 07/05/23 1814 125/76     Systolic BP Percentile --      Diastolic BP Percentile --      Pulse Rate 07/05/23 1814 93     Resp 07/05/23 1814 16     Temp 07/05/23 1814 98.2 F (36.8 C)     Temp Source 07/05/23 1814 Oral     SpO2 07/05/23 1814 95 %     Weight --      Height --      Head Circumference --      Peak Flow --      Pain Score 07/05/23 1819 4     Pain Loc --       Pain Education --      Exclude from Growth Chart --    No data found.  Updated Vital Signs BP 125/76   Pulse 93   Temp 98.2 F (36.8 C) (Oral)   Resp 16   SpO2 95%    Physical Exam Vitals and nursing note reviewed.  Constitutional:      Appearance: Normal appearance. He is normal weight.  HENT:     Head: Normocephalic and atraumatic.     Mouth/Throat:     Mouth: Mucous membranes are moist.     Pharynx: Oropharynx is clear.  Eyes:     Extraocular Movements: Extraocular movements intact.     Conjunctiva/sclera: Conjunctivae normal.     Pupils: Pupils are equal, round, and reactive to light.  Cardiovascular:     Rate and Rhythm: Normal rate and regular rhythm.     Pulses: Normal pulses.     Heart sounds: Normal heart sounds.  Pulmonary:     Effort: Pulmonary effort is normal.     Breath sounds: No wheezing, rhonchi or rales.     Comments: Diminished breath sounds noted throughout, frequent nonproductive cough on exam Musculoskeletal:     Cervical back: Normal range of motion and neck supple.  Skin:    General: Skin is warm and dry.  Neurological:     General: No focal deficit present.     Mental Status: He is alert and oriented to person, place, and time. Mental status is at baseline.  Psychiatric:        Mood and Affect: Mood normal.        Behavior: Behavior normal.      UC Treatments / Results  Labs (all labs ordered are listed, but only abnormal results are displayed) Labs Reviewed - No data to display  EKG   Radiology DG Chest 2 View Result Date: 07/05/2023 CLINICAL DATA:  Cough for 8 days. Adventitious breath sounds on examination, concerning for pneumonia. Flu positive. EXAM: CHEST - 2 VIEW COMPARISON:  10/12/2022 FINDINGS: Normal heart size and pulmonary vascularity. No focal airspace disease or consolidation in the lungs. No blunting of costophrenic angles. No pneumothorax. Mediastinal contours appear intact. IMPRESSION: No active cardiopulmonary  disease. Electronically Signed   By: Burman Nieves M.D.   On: 07/05/2023 19:25    Procedures Procedures (including critical care time)  Medications Ordered in UC Medications - No data to display  Initial Impression / Assessment and Plan / UC Course  I have reviewed the  triage vital signs and the nursing notes.  Pertinent labs & imaging results that were available during my care of the patient were reviewed by me and considered in my medical decision making (see chart for details).     MDM: 1.  Acute upper respiratory infection-Rx'd doxycycline 100 mg capsule: Take 1 capsule twice daily x 7 days; 2.  Cough, unspecified type Rx'd Sterapred Unipak (tapering from 60 mg to 10 mg over 10 days); 3.  Adventitious breath sounds-CXR results revealed above. Advised patient to complete Zithromax.  Then start Doxycycline and Prednisone.  Advised patient to take prednisone with first dose of doxycycline until complete.  Encouraged to increase daily water intake to 64 ounces per day while taking these medications.  Advised we will follow-up with chest x-ray results once received.  Advised if symptoms worsen and/or unresolved please follow-up with your PCP or here for further evaluation.  Final Clinical Impressions(s) / UC Diagnoses   Final diagnoses:  Adventitious breath sounds  Cough, unspecified type  Acute upper respiratory infection     Discharge Instructions      Advised patient to complete Zithromax.  Then start Doxycycline and Prednisone.  Advised patient to take prednisone with first dose of doxycycline until complete.  Encouraged to increase daily water intake to 64 ounces per day while taking these medications.  Advised we will follow-up with chest x-ray results once received.  Advised if symptoms worsen and/or unresolved please follow-up with your PCP or here for further evaluation.     ED Prescriptions     Medication Sig Dispense Auth. Provider   doxycycline (VIBRAMYCIN) 100 MG  capsule Take 1 capsule (100 mg total) by mouth 2 (two) times daily for 7 days. 14 capsule Trevor Iha, FNP   predniSONE (STERAPRED UNI-PAK 21 TAB) 10 MG (21) TBPK tablet Take by mouth daily. Take 6 tabs by mouth daily  for 2 days, then 5 tabs for 2 days, then 4 tabs for 2 days, then 3 tabs for 2 days, 2 tabs for 2 days, then 1 tab by mouth daily for 2 days 42 tablet Trevor Iha, FNP      PDMP not reviewed this encounter.   Trevor Iha, FNP 07/05/23 1945

## 2023-09-13 ENCOUNTER — Encounter: Payer: Self-pay | Admitting: Family Medicine

## 2023-12-03 ENCOUNTER — Ambulatory Visit
Admission: EM | Admit: 2023-12-03 | Discharge: 2023-12-03 | Disposition: A | Discharge status: Home / Self Care | Attending: Family Medicine | Admitting: Family Medicine

## 2023-12-03 DIAGNOSIS — J208 Acute bronchitis due to other specified organisms: Secondary | ICD-10-CM | POA: Diagnosis not present

## 2023-12-03 LAB — POC SARS CORONAVIRUS 2 AG -  ED: SARS Coronavirus 2 Ag: NEGATIVE

## 2023-12-03 MED ORDER — HYDROCOD POLI-CHLORPHE POLI ER 10-8 MG/5ML PO SUER: 5.0000 mL | Freq: Two times a day (BID) | ORAL | 0 refills | Status: AC | PRN

## 2023-12-03 MED ORDER — AZITHROMYCIN 250 MG PO TABS: 250.0000 mg | ORAL_TABLET | Freq: Every day | ORAL | 0 refills | Status: AC

## 2023-12-03 NOTE — Discharge Instructions (Signed)
 I believe this is a viral infection Get plenty of rest, drink a lot of fluids, use a humidifier in your room if you have 1 Take Tussionex if needed for severe cough Take something like Mucinex  DM for moderate cough Fill and take the azithromycin  if you fail to improve

## 2023-12-03 NOTE — ED Provider Notes (Signed)
 [facility]    CSN: [id number] Arrival date & time: 12/03/23  0944      History   Chief Complaint Chief Complaint  Patient presents with   Cough    HPI Brandon Thomas is a 48 y.o. male.   HPI Patient states that he has a cough last couple of days.  He states that he is prone to bronchitis a couple times a year.  He is usually treated with a Z-Pak.  Currently has had a cough since Saturday.  States that he did have recent air travel.  He has having some cold and flu symptoms, body aches and fatigue.  No one else at home is sick.  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   Moderate hypoxic-ischemic encephalopathy    CVA (cerebral vascular accident) (HCC) 04/09/2019   Cellulitis 04/09/2019   Superficial venous thrombosis of arm, right 04/09/2019   Edema of right upper arm 04/09/2019   Substance abuse (HCC) 04/09/2019   Encephalopathy 04/09/2019   History of basal cell cancer 01/20/2019   History of opioid abuse (HCC) 01/20/2019   Anal fissure 04/15/2015   Hypogonadism in male 04/13/2015   Chest pain 04/13/2015   Snoring 04/13/2015   Fatigue 04/13/2015   Dyslipidemia 04/13/2015   Insomnia 07/17/2014   Anxiety 09/08/2013   Hyperlipidemia 08/20/2013   Elevated LFTs 08/20/2013   Exertional chest pain 08/20/2013    Past Surgical History:  Procedure Laterality Date   WISDOM TOOTH EXTRACTION         Home Medications    Prior to Admission medications   Medication Sig Start Date End Date Taking? Authorizing Provider  chlorpheniramine-HYDROcodone  (TUSSIONEX) 10-8 MG/5ML Take 5 mLs by mouth every 12 (twelve) hours as needed  for cough. 12/03/23  Yes Maranda Jamee Jacob, MD  azithromycin  (ZITHROMAX ) 250 MG tablet Take 1 tablet (250 mg total) by mouth daily. Take first 2 tablets together, then 1 every day until finished. 12/03/23   Maranda Jamee Jacob, MD  ezetimibe  (ZETIA ) 10 MG tablet Take 1 tablet (10 mg total) by mouth daily. 12/26/22   Bevin Bernice RAMAN, DO  sertraline  (ZOLOFT ) 100 MG tablet Take 1 tablet (100 mg total) by mouth daily. Patient not taking: Reported on 03/10/2020 05/07/19 03/30/20  Marsa Edelman, DO    Family History Family History  Adopted: Yes  Family history unknown: Yes    Social History Social History   Tobacco Use   Smoking status: Never    Passive exposure: Past   Smokeless tobacco: Current    Types: Chew  Vaping Use   Vaping status: Former  Substance Use Topics   Alcohol use: Not Currently   Drug use: Not Currently     Allergies   Atorvastatin , Crestor [rosuvastatin], and Penicillins   Review of Systems Review of Systems See HPI  Physical Exam Triage Vital Signs ED Triage Vitals  Encounter Vitals Group     BP 12/03/23 1011 113/71     Girls Systolic BP Percentile --      Girls Diastolic BP Percentile --      Boys Systolic BP Percentile --      Boys Diastolic BP  Percentile --      Pulse Rate 12/03/23 1011 95     Resp 12/03/23 1011 17     Temp 12/03/23 1011 99.1 F (37.3 C)     Temp Source 12/03/23 1011 Oral     SpO2 12/03/23 1011 96 %     Weight --      Height --      Head Circumference --      Peak Flow --      Pain Score 12/03/23 1012 0     Pain Loc --      Pain Education --      Exclude from Growth Chart --    No data found.  Updated Vital Signs BP 113/71 (BP Location: Right Arm)   Pulse 95   Temp 99.1 F (37.3 C) (Oral)   Resp 17   SpO2 96%      Physical Exam Constitutional:      General: He is not in acute distress.    Appearance: He is well-developed.  HENT:     Head: Normocephalic and atraumatic.     Right Ear: Tympanic membrane  normal.     Left Ear: Tympanic membrane normal.     Nose: No congestion.     Mouth/Throat:     Pharynx: No posterior oropharyngeal erythema.  Eyes:     Conjunctiva/sclera: Conjunctivae normal.     Pupils: Pupils are equal, round, and reactive to light.  Cardiovascular:     Rate and Rhythm: Normal rate and regular rhythm.     Heart sounds: Normal heart sounds.  Pulmonary:     Effort: Pulmonary effort is normal. No respiratory distress.     Breath sounds: Rhonchi present.     Comments: Wet cough.  Few scattered rhonchi Abdominal:     General: There is no distension.     Palpations: Abdomen is soft.  Musculoskeletal:        General: Normal range of motion.     Cervical back: Normal range of motion.  Skin:    General: Skin is warm and dry.  Neurological:     Mental Status: He is alert.      UC Treatments / Results  Labs (all labs ordered are listed, but only abnormal results are displayed) Labs Reviewed  POC SARS CORONAVIRUS 2 AG -  ED    EKG   Radiology No results found.  Procedures Procedures (including critical care time)  Medications Ordered in UC Medications - No data to display  Initial Impression / Assessment and Plan / UC Course  I have reviewed the triage vital signs and the nursing notes.  Pertinent labs & imaging results that were available during my care of the patient were reviewed by me and considered in my medical decision making (see chart for details).     I told patient he had a viral bronchitis.  COVID is negative.  Patient expresses concern that he is not getting a Z-Pak, states that he has travel again later this week.  Travels a lot with his work.  States it always helps him.  I did give him a written prescription for azithromycin  to fill and take if he fails to improve with conservative treatment.  I encouraged him to give this a week Final Clinical Impressions(s) / UC Diagnoses   Final diagnoses:  Acute bronchitis due to other specified  organisms     Discharge Instructions      I believe this is a viral infection Get plenty  of rest, drink a lot of fluids, use a humidifier in your room if you have 1 Take Tussionex if needed for severe cough Take something like Mucinex  DM for moderate cough Fill and take the azithromycin  if you fail to improve   ED Prescriptions     Medication Sig Dispense Auth. Provider   azithromycin  (ZITHROMAX ) 250 MG tablet Take 1 tablet (250 mg total) by mouth daily. Take first 2 tablets together, then 1 every day until finished. 6 tablet Maranda Jamee Jacob, MD   chlorpheniramine-HYDROcodone  (TUSSIONEX) 10-8 MG/5ML Take 5 mLs by mouth every 12 (twelve) hours as needed for cough. 115 mL Maranda Jamee Jacob, MD      I have reviewed the PDMP during this encounter.   Maranda Jamee Jacob, MD 12/03/23 [phone]

## 2023-12-03 NOTE — ED Triage Notes (Addendum)
 Pt c/o cough since Saturday. OTC cold and flu and delsym prn. Travels a lot for work. Would be like to be covid tested.

## 2023-12-24 ENCOUNTER — Other Ambulatory Visit (HOSPITAL_COMMUNITY): Payer: Self-pay

## 2023-12-24 ENCOUNTER — Telehealth: Payer: Self-pay

## 2023-12-24 ENCOUNTER — Encounter: Payer: Self-pay | Admitting: Pulmonary Disease

## 2023-12-24 ENCOUNTER — Ambulatory Visit (INDEPENDENT_AMBULATORY_CARE_PROVIDER_SITE_OTHER)

## 2023-12-24 ENCOUNTER — Ambulatory Visit (INDEPENDENT_AMBULATORY_CARE_PROVIDER_SITE_OTHER): Admitting: Pulmonary Disease

## 2023-12-24 VITALS — BP 107/74 | HR 97 | Temp 98.4°F | Ht 72.0 in | Wt 212.8 lb

## 2023-12-24 DIAGNOSIS — J4541 Moderate persistent asthma with (acute) exacerbation: Secondary | ICD-10-CM | POA: Diagnosis not present

## 2023-12-24 MED ORDER — WIXELA INHUB 500-50 MCG/ACT IN AEPB: 1.0000 | INHALATION_SPRAY | Freq: Two times a day (BID) | RESPIRATORY_TRACT | 3 refills | Status: AC

## 2023-12-24 MED ORDER — PREDNISONE 20 MG PO TABS
ORAL_TABLET | ORAL | 0 refills | Status: AC
Start: 2023-12-24 — End: 2024-01-03

## 2023-12-24 MED ORDER — BUDESONIDE-FORMOTEROL FUMARATE 160-4.5 MCG/ACT IN AERO
2.0000 | INHALATION_SPRAY | Freq: Two times a day (BID) | RESPIRATORY_TRACT | 12 refills | Status: AC
Start: 2023-12-24 — End: ?

## 2023-12-24 NOTE — Patient Instructions (Signed)
 I prescribed prednisone , 40 mg for 5 days then 20 mg for 5 days and stop, I think this will help in the immediate or short-term  Chest x-ray today mainly for reassurance just to make sure everything is okay with the chest discomfort, lung sounds clear which is relatively reassuring  I sent a prescription for Symbicort , 2 puffs in the morning 2 puffs in the evening.  Rinse her mouth out thoroughly with water  after every use.  The idea here is a long-term control to try to prevent or decrease the frequency of bronchitis etc.  Based on your history with decreased exercise tolerance with cardiovascular work in the past, mild hyperinflation on chest x-ray, and repeated bouts of bronchitis, something like asthma is the most likely diagnosis.  Return to clinic in 3 months or sooner as needed with Dr. Annella

## 2023-12-24 NOTE — Progress Notes (Signed)
 @Patient  ID: Brandon Thomas, male    DOB: Mar 21, 1976, 48 y.o.   MRN: [id number]  Chief Complaint  Patient presents with   Follow-up    cough x 3-4 weeks. Sometimes coughing up clear phlegm.   Pt describes itch and burning on the right side of his chest.     Referring provider: Lowella Benton LITTIE, PA  HPI:   48 y.o. man whom are seen as an acute visit for ongoing cough for 3 to 4 weeks with itching and burning on the right side of chest.  Multiple prior pulmonary notes reviewed.  Multiple urgent care notes reviewed.  Patient with cough for 3 to 4 weeks.  Occasionally productive of phlegm.  And itchy and burning, discomfort on the right side of the chest.  Not improved with antibiotics.  Similar symptoms at least twice a year.  Prolonged bronchitis symptoms.  In the past was seen by Dr. Theophilus.  Diagnosed with asthma.  Placed on Symbicort .  He is not sure if it helps or not.  Does not recall.  He endorsed exercise intolerance all his life.  Cardiovascular exercise intolerance all his life.  Unable to keep up with peers in high school etc.  Unable to do significant cardio work with significant shortness of breath.  Otherwise day-to-day work no issues with shortness of breath.  Reviewed most recent chest x-ray 06/2023 clear with mild hyperinflation on my review interpretation.  Questionaires / Pulmonary Flowsheets:   ACT:      No data to display          MMRC:     No data to display          Epworth:      No data to display          Tests:   FENO:  No results found for: NITRICOXIDE  PFT:    Latest Ref Rng & Units 08/30/2021   10:58 AM  PFT Results  FVC-Pre L 5.35   FVC-Predicted Pre % 94   FVC-Post L 5.33   FVC-Predicted Post % 93   Pre FEV1/FVC % % 85   Post FEV1/FCV % % 85   FEV1-Pre L 4.52   FEV1-Predicted Pre % 101   FEV1-Post L 4.53   DLCO uncorrected ml/min/mmHg 46.17   DLCO UNC% % 140   DLCO corrected ml/min/mmHg 46.17   DLCO COR  %Predicted % 140   DLVA Predicted % 132   TLC L 8.17   TLC % Predicted % 108   RV % Predicted % 92   Percent reviewed and and interpreted as normal spirometry, no bronchodilator response, lung volumes within normal notes, DLCO is elevated  WALK:      No data to display          Imaging: No results found.  Lab Results: Personally reviewed CBC    Component Value Date/Time   WBC 6.5 12/25/2022 1124   WBC 6.6 07/04/2021 1557   RBC 5.08 12/25/2022 1124   RBC 6.02 (H) 07/04/2021 1557   HGB 14.9 12/25/2022 1124   HCT 43.6 12/25/2022 1124   PLT 324 12/25/2022 1124   MCV 86 12/25/2022 1124   MCH 29.3 12/25/2022 1124   MCH 28.8 04/14/2019 1100   MCHC 34.2 12/25/2022 1124   MCHC 33.0 07/04/2021 1557   RDW 12.0 12/25/2022 1124   LYMPHSABS 2.0 07/04/2021 1557   MONOABS 0.5 07/04/2021 1557   EOSABS 0.1 07/04/2021 1557   BASOSABS 0.1 07/04/2021 1557  BMET    Component Value Date/Time   NA 142 12/25/2022 1124   K 4.2 12/25/2022 1124   CL 103 12/25/2022 1124   CO2 23 12/25/2022 1124   GLUCOSE 85 12/25/2022 1124   GLUCOSE 94 04/14/2019 1100   BUN 10 12/25/2022 1124   CREATININE 0.95 12/25/2022 1124   CREATININE 1.06 04/12/2015 1023   CALCIUM  9.6 12/25/2022 1124   GFRNONAA >60 04/14/2019 1100   GFRNONAA 87 04/12/2015 1023   GFRAA >60 04/14/2019 1100   GFRAA >89 04/12/2015 1023    BNP No results found for: BNP  ProBNP No results found for: PROBNP  Specialty Problems       Pulmonary Problems   Snoring   Tonsillar calculus   OSA (obstructive sleep apnea)    Allergies  Allergen Reactions   Atorvastatin  Other (See Comments)    RUQ pain   Crestor [Rosuvastatin] Other (See Comments)    Elevated LFTs   Penicillins     Other reaction(s): Other (See Comments) Reaction unknown. Happened in childhood Other reaction(s): Other (See Comments) Reaction unknown. Happened in childhood Other reaction(s): Other (See Comments) Reaction unknown. Happened in  childhood    Immunization History  Administered Date(s) Administered   Hepatitis B 08/21/2007   Hepatitis B, ADULT 08/21/2007, 09/25/2007, 10/28/2007, 01/17/2008   Hepatitis B, PED/ADOLESCENT 08/21/2007, 09/25/2007, 10/28/2007, 01/17/2008   Influenza Inj Mdck Quad Pf 06/26/2022   Influenza,inj,Quad PF,6+ Mos 03/25/2015, 04/14/2021   Influenza,inj,quad, With Preservative 04/08/2014   Influenza-Unspecified 04/08/2014, 01/20/2019   Janssen (J&J) SARS-COV-2 Vaccination 08/13/2019   MMR 08/20/2000   PPD Test 04/08/2014, 03/20/2019, 05/11/2021, 10/12/2022   Pneumococcal Polysaccharide-23 04/09/2019   Td 08/20/2000   Tdap 03/25/2015   Varicella 1976-04-20    Past Medical History:  Diagnosis Date   Depression    Well treated without meds currently   Elevated LFTs 08/20/2013   Hyperlipidemia 08/20/2013    Tobacco History: Social History   Tobacco Use  Smoking Status Never   Passive exposure: Past  Smokeless Tobacco Current   Types: Chew   Ready to quit: Not Answered Counseling given: Not Answered   Continue to not smoke  Outpatient Encounter Medications as of 12/24/2023  Medication Sig   budesonide -formoterol  (SYMBICORT ) 160-4.5 MCG/ACT inhaler Inhale 2 puffs into the lungs 2 (two) times daily.   predniSONE  (DELTASONE ) 20 MG tablet Take 2 tablets (40 mg total) by mouth daily with breakfast for 5 days, THEN 1 tablet (20 mg total) daily with breakfast for 5 days.   azithromycin  (ZITHROMAX ) 250 MG tablet Take 1 tablet (250 mg total) by mouth daily. Take first 2 tablets together, then 1 every day until finished. (Patient not taking: Reported on 12/24/2023)   chlorpheniramine-HYDROcodone  (TUSSIONEX) 10-8 MG/5ML Take 5 mLs by mouth every 12 (twelve) hours as needed for cough. (Patient not taking: Reported on 12/24/2023)   ezetimibe  (ZETIA ) 10 MG tablet Take 1 tablet (10 mg total) by mouth daily. (Patient not taking: Reported on 12/24/2023)   [DISCONTINUED] sertraline  (ZOLOFT ) 100 MG  tablet Take 1 tablet (100 mg total) by mouth daily. (Patient not taking: Reported on 03/10/2020)   No facility-administered encounter medications on file as of 12/24/2023.     Review of Systems  Review of Systems  No chest pain exertion orthopnea or PND comprehensive review of systems otherwise negative Physical Exam  BP 107/74   Pulse 97   Temp 98.4 F (36.9 C)   Ht 6' (1.829 m)   Wt 212 lb 12.8 oz (96.5 kg)  SpO2 95% Comment: RA  BMI 28.86 kg/m   Wt Readings from Last 5 Encounters:  12/24/23 212 lb 12.8 oz (96.5 kg)  12/25/22 220 lb 4 oz (99.9 kg)  09/24/22 220 lb (99.8 kg)  08/30/21 211 lb 6.4 oz (95.9 kg)  07/04/21 220 lb 6.4 oz (100 kg)    BMI Readings from Last 5 Encounters:  12/24/23 28.86 kg/m  12/25/22 30.72 kg/m  09/24/22 30.68 kg/m  08/30/21 27.89 kg/m  07/04/21 30.74 kg/m     Physical Exam General: Sitting in chair, no acute distress Eyes: EOMI, no icterus Neck: Supple, no JVP Pulmonary: Clear, no work of breathing Cardiovascular: Warm, no edema Abdomen: Nondistended MSK: There is otherwise, no jaundice Neuro: Normal gait, no weakness Psych: Normal mood, full affect   Assessment & Plan:   Recurrent bronchitis, acute on chronic: With ongoing cough and chest discomfort on the right for several weeks.  Not improved with antibiotic therapy.  Chest x-ray for further evaluation, suspect will be reassuring.  Prednisone  taper today.  Asthma: Clinical diagnosis based on exercise intolerance with cardio, recurrent bronchitis, mild hyperinflation on chest x-ray.  Symbicort  2 puffs twice a day, new prescription today.  May need to change to different inhaler based on cost, insurance preference.  Consider phenotyping, Biologics in the future if not controlled with inhalers.   Return in about 3 months (around 03/25/2024) for f/u Dr. Annella.   Donnice JONELLE Annella, MD 12/24/2023   This appointment required 41 minutes of patient care (this includes  precharting, chart review, review of results, face-to-face care, etc.).

## 2023-12-24 NOTE — Telephone Encounter (Signed)
*  [facility]  Pharmacy Patient Advocate Encounter   Received notification from CoverMyMeds that prior authorization for Budesonide -Formoterol  Fumarate 160-4.5MCG/ACT aerosol  is required/requested.   Insurance verification completed.   The patient is insured through [facility] .   Per test claim:  Brand Symbicort , Wixela, Brand Breo Ellipta, Brand Advair HFA is preferred by the insurance.  If suggested medication is appropriate, Please send in a new RX and discontinue this one. If not, please advise as to why it's not appropriate so that we may request a Prior Authorization. Please note, some preferred medications may still require a PA.  If the suggested medications have not been trialed and there are no contraindications to their use, the PA will not be submitted, as it will not be approved.   Brand Symbicort -$223.63 Wixela-$103.75 Breo Ellipta-$388.40 Brand Advair [id number]  *patient has a deductible to meet

## 2023-12-24 NOTE — Telephone Encounter (Signed)
 Please advise on med change or PA if appropriate

## 2023-12-24 NOTE — Telephone Encounter (Signed)
 Rx sent and pt notified on VM

## 2023-12-24 NOTE — Telephone Encounter (Signed)
 Please send Wixella 500-50 1 puff BID (qty 60, refills 3) and inform patient of change and that we believe cost will decrease after deductible is met. This will replace the symbciort. Rince mouth after every use.

## 2023-12-24 NOTE — Addendum Note (Signed)
 Addended by: Lucian Baswell L on: 12/24/2023 04:05 PM   Modules accepted: Orders

## 2023-12-26 ENCOUNTER — Ambulatory Visit: Payer: Self-pay | Admitting: Pulmonary Disease

## 2023-12-26 NOTE — Telephone Encounter (Signed)
**Note De-identified  [place] Obfuscation** Please advise 

## 2024-03-25 ENCOUNTER — Ambulatory Visit: Admitting: Pulmonary Disease

## 2024-03-30 NOTE — Telephone Encounter (Signed)
 Entered in error
